# Patient Record
Sex: Female | Born: 1955 | Race: Black or African American | Hispanic: No | Marital: Married | Smoking: Never smoker
Health system: Southern US, Community
[De-identification: ages and names within clinical notes are randomized; demographics above are authoritative.]

## PROBLEM LIST (undated history)

## (undated) DIAGNOSIS — T80219A Unspecified infection due to central venous catheter, initial encounter: Secondary | ICD-10-CM

## (undated) DIAGNOSIS — Z9221 Personal history of antineoplastic chemotherapy: Secondary | ICD-10-CM

## (undated) DIAGNOSIS — L639 Alopecia areata, unspecified: Secondary | ICD-10-CM

## (undated) DIAGNOSIS — Z923 Personal history of irradiation: Secondary | ICD-10-CM

## (undated) DIAGNOSIS — E785 Hyperlipidemia, unspecified: Secondary | ICD-10-CM

## (undated) DIAGNOSIS — T451X5A Adverse effect of antineoplastic and immunosuppressive drugs, initial encounter: Secondary | ICD-10-CM

## (undated) DIAGNOSIS — D6481 Anemia due to antineoplastic chemotherapy: Secondary | ICD-10-CM

## (undated) DIAGNOSIS — E119 Type 2 diabetes mellitus without complications: Secondary | ICD-10-CM

## (undated) DIAGNOSIS — Z8742 Personal history of other diseases of the female genital tract: Secondary | ICD-10-CM

## (undated) DIAGNOSIS — G620 Drug-induced polyneuropathy: Secondary | ICD-10-CM

## (undated) DIAGNOSIS — N390 Urinary tract infection, site not specified: Secondary | ICD-10-CM

## (undated) DIAGNOSIS — C50919 Malignant neoplasm of unspecified site of unspecified female breast: Secondary | ICD-10-CM

## (undated) DIAGNOSIS — I10 Essential (primary) hypertension: Secondary | ICD-10-CM

## (undated) HISTORY — DX: Essential (primary) hypertension: I10

## (undated) HISTORY — PX: COLONOSCOPY: SHX174

## (undated) HISTORY — PX: MASTECTOMY: SHX3

## (undated) HISTORY — DX: Alopecia areata, unspecified: L63.9

## (undated) HISTORY — DX: Malignant neoplasm of unspecified site of unspecified female breast: C50.919

---

## 1993-04-05 HISTORY — PX: ABDOMINAL HYSTERECTOMY: SHX81

## 2005-09-09 ENCOUNTER — Encounter: Admission: RE | Admit: 2005-09-09 | Discharge: 2005-09-09 | Payer: Self-pay | Admitting: Internal Medicine

## 2005-09-22 ENCOUNTER — Encounter: Admission: RE | Admit: 2005-09-22 | Discharge: 2005-09-22 | Payer: Self-pay | Admitting: Internal Medicine

## 2006-09-19 ENCOUNTER — Encounter: Admission: RE | Admit: 2006-09-19 | Discharge: 2006-09-19 | Payer: Self-pay | Admitting: Internal Medicine

## 2007-09-25 ENCOUNTER — Encounter: Admission: RE | Admit: 2007-09-25 | Discharge: 2007-09-25 | Payer: Self-pay | Admitting: Internal Medicine

## 2008-09-26 ENCOUNTER — Encounter: Admission: RE | Admit: 2008-09-26 | Discharge: 2008-09-26 | Payer: Self-pay | Admitting: Internal Medicine

## 2009-09-30 ENCOUNTER — Encounter: Admission: RE | Admit: 2009-09-30 | Discharge: 2009-09-30 | Payer: Self-pay | Admitting: Internal Medicine

## 2010-08-25 ENCOUNTER — Other Ambulatory Visit: Payer: Self-pay | Admitting: Internal Medicine

## 2010-08-25 DIAGNOSIS — Z1231 Encounter for screening mammogram for malignant neoplasm of breast: Secondary | ICD-10-CM

## 2010-10-06 ENCOUNTER — Ambulatory Visit
Admission: RE | Admit: 2010-10-06 | Discharge: 2010-10-06 | Disposition: A | Discharge status: Home / Self Care | Payer: Private Health Insurance - Indemnity | Source: Ambulatory Visit | Attending: Internal Medicine | Admitting: Internal Medicine

## 2010-10-06 DIAGNOSIS — Z1231 Encounter for screening mammogram for malignant neoplasm of breast: Secondary | ICD-10-CM

## 2011-09-02 ENCOUNTER — Other Ambulatory Visit: Payer: Self-pay | Admitting: Internal Medicine

## 2011-09-02 DIAGNOSIS — Z1231 Encounter for screening mammogram for malignant neoplasm of breast: Secondary | ICD-10-CM

## 2011-10-13 ENCOUNTER — Ambulatory Visit
Admission: RE | Admit: 2011-10-13 | Discharge: 2011-10-13 | Disposition: A | Discharge status: Home / Self Care | Payer: Private Health Insurance - Indemnity | Source: Ambulatory Visit | Attending: Internal Medicine | Admitting: Internal Medicine

## 2011-10-13 DIAGNOSIS — Z1231 Encounter for screening mammogram for malignant neoplasm of breast: Secondary | ICD-10-CM

## 2012-09-06 ENCOUNTER — Other Ambulatory Visit: Payer: Self-pay

## 2012-09-06 DIAGNOSIS — Z1231 Encounter for screening mammogram for malignant neoplasm of breast: Secondary | ICD-10-CM

## 2012-10-16 ENCOUNTER — Ambulatory Visit
Admission: RE | Admit: 2012-10-16 | Discharge: 2012-10-16 | Disposition: A | Discharge status: Home / Self Care | Payer: Private Health Insurance - Indemnity | Source: Ambulatory Visit

## 2012-10-16 ENCOUNTER — Ambulatory Visit: Payer: Private Health Insurance - Indemnity

## 2012-10-16 DIAGNOSIS — Z1231 Encounter for screening mammogram for malignant neoplasm of breast: Secondary | ICD-10-CM

## 2013-09-11 ENCOUNTER — Other Ambulatory Visit: Payer: Self-pay

## 2013-09-11 DIAGNOSIS — Z1231 Encounter for screening mammogram for malignant neoplasm of breast: Secondary | ICD-10-CM

## 2013-10-03 HISTORY — PX: BREAST SURGERY: SHX581

## 2013-10-17 ENCOUNTER — Other Ambulatory Visit: Payer: Self-pay | Admitting: Internal Medicine

## 2013-10-17 ENCOUNTER — Ambulatory Visit
Admission: RE | Admit: 2013-10-17 | Discharge: 2013-10-17 | Disposition: A | Discharge status: Home / Self Care | Payer: Managed Care, Other (non HMO) | Source: Ambulatory Visit

## 2013-10-17 DIAGNOSIS — Z1231 Encounter for screening mammogram for malignant neoplasm of breast: Secondary | ICD-10-CM

## 2013-10-17 DIAGNOSIS — R928 Other abnormal and inconclusive findings on diagnostic imaging of breast: Secondary | ICD-10-CM

## 2013-10-25 ENCOUNTER — Other Ambulatory Visit: Payer: Managed Care, Other (non HMO)

## 2013-10-26 ENCOUNTER — Ambulatory Visit
Admission: RE | Admit: 2013-10-26 | Discharge: 2013-10-26 | Disposition: A | Discharge status: Home / Self Care | Payer: Managed Care, Other (non HMO) | Source: Ambulatory Visit | Attending: Internal Medicine | Admitting: Internal Medicine

## 2013-10-26 ENCOUNTER — Other Ambulatory Visit: Payer: Self-pay | Admitting: Internal Medicine

## 2013-10-26 DIAGNOSIS — R928 Other abnormal and inconclusive findings on diagnostic imaging of breast: Secondary | ICD-10-CM

## 2013-10-26 DIAGNOSIS — N6320 Unspecified lump in the left breast, unspecified quadrant: Secondary | ICD-10-CM

## 2013-10-30 ENCOUNTER — Ambulatory Visit
Admission: RE | Admit: 2013-10-30 | Discharge: 2013-10-30 | Disposition: A | Discharge status: Home / Self Care | Payer: Managed Care, Other (non HMO) | Source: Ambulatory Visit | Attending: Internal Medicine | Admitting: Internal Medicine

## 2013-10-30 ENCOUNTER — Other Ambulatory Visit: Payer: Self-pay | Admitting: Internal Medicine

## 2013-10-30 DIAGNOSIS — N6320 Unspecified lump in the left breast, unspecified quadrant: Secondary | ICD-10-CM

## 2013-10-31 ENCOUNTER — Other Ambulatory Visit: Payer: Self-pay | Admitting: Internal Medicine

## 2013-10-31 DIAGNOSIS — C50912 Malignant neoplasm of unspecified site of left female breast: Secondary | ICD-10-CM

## 2013-10-31 DIAGNOSIS — D0512 Intraductal carcinoma in situ of left breast: Secondary | ICD-10-CM

## 2013-11-01 ENCOUNTER — Other Ambulatory Visit: Payer: Managed Care, Other (non HMO)

## 2013-11-02 ENCOUNTER — Ambulatory Visit
Admission: RE | Admit: 2013-11-02 | Discharge: 2013-11-02 | Disposition: A | Discharge status: Home / Self Care | Payer: Managed Care, Other (non HMO) | Source: Ambulatory Visit | Attending: Internal Medicine | Admitting: Internal Medicine

## 2013-11-02 ENCOUNTER — Other Ambulatory Visit: Payer: Self-pay | Admitting: Internal Medicine

## 2013-11-02 ENCOUNTER — Telehealth: Payer: Self-pay | Admitting: *Deleted

## 2013-11-02 DIAGNOSIS — C50912 Malignant neoplasm of unspecified site of left female breast: Secondary | ICD-10-CM

## 2013-11-02 DIAGNOSIS — D0512 Intraductal carcinoma in situ of left breast: Secondary | ICD-10-CM

## 2013-11-02 MED ORDER — GADOBENATE DIMEGLUMINE 529 MG/ML IV SOLN: 15.0000 mL | Freq: Once | INTRAVENOUS | Status: AC | PRN

## 2013-11-02 NOTE — Telephone Encounter (Signed)
Confirmed [facility] for 11/07/13 at 1230 .  Instructions and contact information given.

## 2013-11-05 ENCOUNTER — Ambulatory Visit
Admission: RE | Admit: 2013-11-05 | Discharge: 2013-11-05 | Disposition: A | Discharge status: Home / Self Care | Payer: Managed Care, Other (non HMO) | Source: Ambulatory Visit | Attending: Internal Medicine | Admitting: Internal Medicine

## 2013-11-05 DIAGNOSIS — C50912 Malignant neoplasm of unspecified site of left female breast: Secondary | ICD-10-CM

## 2013-11-06 ENCOUNTER — Other Ambulatory Visit: Payer: Self-pay | Admitting: Internal Medicine

## 2013-11-06 DIAGNOSIS — R928 Other abnormal and inconclusive findings on diagnostic imaging of breast: Secondary | ICD-10-CM

## 2013-11-07 ENCOUNTER — Other Ambulatory Visit (INDEPENDENT_AMBULATORY_CARE_PROVIDER_SITE_OTHER): Payer: Self-pay | Admitting: General Surgery

## 2013-11-07 ENCOUNTER — Ambulatory Visit (HOSPITAL_BASED_OUTPATIENT_CLINIC_OR_DEPARTMENT_OTHER): Payer: Managed Care, Other (non HMO) | Admitting: Hematology

## 2013-11-07 ENCOUNTER — Ambulatory Visit (HOSPITAL_BASED_OUTPATIENT_CLINIC_OR_DEPARTMENT_OTHER): Payer: Managed Care, Other (non HMO) | Admitting: General Surgery

## 2013-11-07 ENCOUNTER — Ambulatory Visit
Admission: RE | Admit: 2013-11-07 | Discharge: 2013-11-07 | Disposition: A | Discharge status: Home / Self Care | Payer: Managed Care, Other (non HMO) | Source: Ambulatory Visit | Attending: Radiation Oncology | Admitting: Radiation Oncology

## 2013-11-07 ENCOUNTER — Ambulatory Visit: Payer: Managed Care, Other (non HMO)

## 2013-11-07 ENCOUNTER — Encounter: Payer: Self-pay | Admitting: General Practice

## 2013-11-07 VITALS — BP 134/80 | HR 70 | Temp 98.4°F | Resp 18 | Ht 64.0 in | Wt 167.8 lb

## 2013-11-07 DIAGNOSIS — Z170 Estrogen receptor positive status [ER+]: Secondary | ICD-10-CM

## 2013-11-07 DIAGNOSIS — C50412 Malignant neoplasm of upper-outer quadrant of left female breast: Secondary | ICD-10-CM

## 2013-11-07 DIAGNOSIS — C50919 Malignant neoplasm of unspecified site of unspecified female breast: Secondary | ICD-10-CM

## 2013-11-07 DIAGNOSIS — C50419 Malignant neoplasm of upper-outer quadrant of unspecified female breast: Secondary | ICD-10-CM

## 2013-11-07 DIAGNOSIS — R928 Other abnormal and inconclusive findings on diagnostic imaging of breast: Secondary | ICD-10-CM

## 2013-11-07 NOTE — Progress Notes (Signed)
Rachel [name] NOTE  Patient Care Team: [name], MD as PCP - General (Internal Medicine) [name], MD as Consulting Physician (General Surgery) Rexene Edison, MD as Consulting Physician (Radiation Oncology)  CHIEF COMPLAINTS/PURPOSE OF CONSULTATION:   HER 2 Positive Left Breast Cancer for Consideration of Neoadjuvant Chemotherapy.  HISTORY OF PRESENTING ILLNESS:   Rachel [name] 58 y.o. pleasant female who lives in [place], [place] is evaluated today for a new diagnosis of a HER-2+ breast cancer. She was seen at [facility] today along with Dr Barry Dienes from Surgery and Dr Valere Dross in Radiation Oncology in the multidisciplinary clinic. Patient came today along with her husband Oneida Alar.  She had been getting annual mammograms since she turned 52. She never had a previous breast problem or biopsy but is known to have dense breasts as per her previous mammograms. Last year mammogram 10/16/2012 was fine. This year she went for her screening mammogram but did not feel a lump. On 10/17/2013 to the Breast center of [facility] and was found to have distortion in left breast and spot compression views and ultrasound was recommended.    LEFT CC AND MLO VIEWS showed abnormality in left breast     Spot Compression views showed persistent abnormality in left breast.     US breast showed a hypoechoic irregular mass with surrounding distortion at 2'0 clock position located 4 cm from nipple measuring 1.1 x 0.9 x 0.7 cm. No left axillary lymphadenopathy noted. [place] was corresponding to mammographic results.     Patient underwent an US guided biopsy with Clip placement on 10/30/2013. Pathology from that (Accession no [id number]) showed an Invasive ductal carcinoma, DCIS and calcifications. Estrogen receptor (ER) 100%+, Progesterone receptor (PR) 42%+ and proliferative marker KI-67 index 53%+. Her-2 receptor status by FISH is positive. So it is a triple  positive breast cancer, also known as "LUMINAL B molecular subtype". The grade of tumor invasive and DCIS was reported as grade II. Luminal B breast tumors are usually large in size, higher grade, high mitotic index, high nodal involvement and up to 30% have p53 mutation. Their survival is also very good though little less than Luminal A subtype. Overall I will call it a favorable biology of tumor.  Interestingly the MRI breast performed on 11/05/2013 showed an enhancing mass which is much larger than the mass seen mammographically or sonographically.  FINDINGS: Breast composition: b. Scattered fibroglandular tissue. Background parenchymal enhancement: Moderate Right breast: No mass or abnormal enhancement. Left breast: In the anterior and middle thirds of the upper and upper outer quadrant of the left breast is a heterogeneous enhancing irregular mass measuring 5 x 2.5 x 5.1 cm in transverse, anterior-posterior and craniocaudal dimensions. Signal void artifact is seen along the lateral aspect of enhancement consistent with the biopsy clip. Along the anterior aspect of the irregular enhancing mass is a more focal 1.5 cm enhancement. Lymph nodes: There is a 9 mm level 1 left axillary lymph node that does not maintain a normal fatty hilum. It is concerning for metastatic involvement. Ancillary findings: None.  IMPRESSION: Irregular enhancing mass measuring 5.1 cm in the upper and upper-outer quadrants of the left breast. T Suspicious level 1 left axillary lymph node concerning for metastatic involving.              It was also recommended to get a biopsy of the anterior aspect of abnormal enhancement in the left breast is recommended to prove the extent  of disease. A second-look ultrasound could be performed for the lymph node seen with MR imaging with possible biopsy if clinically indicated.   I have seen the imaging studies and discussed the case with the surgeon and radiation oncologist. On 11/09/13  patient is getting a biopsy with MRI from the anterior enhancing mass and on 11/12/13, we are planning to do an ultrasound guided lymph node biopsy. There was discussion today about the use of Neoadjuvant chemotherapy on her which is very appropriate and I discussed that with patient in detail.  Clinically and based on radiographic data so far it looks like a stage III-A breast cancer (size max 5.1 cm T3, possible nodal involvement N1). The area of the mass had some bruising and swelling post procedure but I can definitely feel the mass and there is no nipple involvement or skin changes or inflammatory component. I cannot feel the lymph node on my exam. If we call it T2 lesion (2-5 cm) after we get more pathological information on tumor and if Lymph node comes back negative, then it could also be a Stage II-A cancer(T2,N0) so further clinical staging is still on way.  Patient  is calm and composed and a lady of faith and does not seem overwhelmed with seeing all the doctors and have excellent support from her husband and family.  I reviewed her records extensively and collaborated the history with the patient.  In terms of breast cancer risk profile:   She menarched at early age of 60 and went to menopause at age 42 when she had a partial hysterectomy (1995) and one ovary removed (for endometriosis), following year she total hysterectomy and other ovary removed as well (1996). She had 1 pregnancy, her first child was born at age 89.  Breast feeding history I did not ask.  She received birth control pills and shots off and on for 20 years.  She was never exposed to fertility medications but did take hormone replacement therapy (Premarin) for about 5 years ending in 2001. She has NO family history of Breast/GYN/GI cancer. Last screening colonoscopy was 2007 and she had a bone density done 06/13/2008 at [place] office showing a normal BMD by WHO criteria. Last pap done 1997.  MEDICAL HISTORY:    Past Medical History  Diagnosis Date  . Breast cancer   . Diabetes mellitus without complication   . Hypertension   . Alopecia areata   . Endometriosis   . Hypercholesteremia     SURGICAL HISTORY: Past Surgical History  Procedure Laterality Date  . Abdominal hysterectomy      BSO   Partial hysterectomy 1995 and total hysterectomy with oophorectomy 1996 for endometriosis.  SOCIAL HISTORY: History   Social History  . Marital Status: Married    Spouse Name: Willaim Rayas. Foust    Number of Children: N/A  . Years of Education: N/A   Occupational History  . House wife    Social History Main Topics  . Smoking status: Never Smoker   . Smokeless tobacco: Not on file  . Alcohol Use: No  . Drug Use: No  . Sexual Activity: Yes    Birth Control/ Protection: Post-menopausal   Other Topics Concern  . Not on file   Social History Narrative   Patient lives in [place], [place]. She is a housewife. Both her parents are deceased father at age 39 and mother at age 13. Patient have 2 brothers and 2 sisters. She has a daughter Haxtun  Adams who lives in [place], [place]. Patient has good family support. She does not smoke or consume alcohol and no use of recreational drugs.     FAMILY HISTORY: Family History  Problem Relation Age of Onset  . Diabetes Mother   . Coronary artery disease Mother   . Diabetes Father   . Diabetes Sister   . Diabetes Brother   . Diabetes Brother    No history of cancer and her breast cancer does not appear to be hereditary type rather sporadic type.  ALLERGIES:  is allergic to sulfa antibiotics; tetanus toxoids; multihance; and penicillins.  MEDICATIONS:  Current Outpatient Prescriptions  Medication Sig Dispense Refill  . aspirin 81 MG tablet Take 81 mg by mouth daily.      . calcium citrate-vitamin D (CITRACAL+D) 315-200 MG-UNIT per tablet Take 1 tablet by mouth daily.      . clobetasol cream (TEMOVATE) 9.62 % Apply 1 application topically as  needed.      . hydrochlorothiazide (HYDRODIURIL) 25 MG tablet Take 25 mg by mouth daily.      . minoxidil (ROGAINE) 2 % external solution Apply topically as needed.      . Multiple Vitamin (MULTIVITAMIN) tablet Take 1 tablet by mouth daily.       No current facility-administered medications for this visit.    REVIEW OF SYSTEMS:   Constitutional: Denies fevers, chills or abnormal night sweats Eyes: Denies blurriness of vision, double vision or watery eyes Ears, nose, mouth, throat, and face: Denies mucositis or sore throat Respiratory: Denies cough, dyspnea or wheezes Cardiovascular: Denies palpitation, chest discomfort or lower extremity swelling Gastrointestinal:  Denies nausea, heartburn or change in bowel habits Skin: Denies abnormal skin rashes Lymphatics: Denies new lymphadenopathy or easy bruising Neurological:Denies numbness, tingling or new weaknesses Behavioral/Psych: Mood is stable, no new changes  All other systems were reviewed with the patient and are negative.  PHYSICAL EXAMINATION:  ECOG PERFORMANCE STATUS: 1, KPS 100  Filed Vitals:   11/07/13 1334  BP: 134/80  Pulse: 70  Temp: 98.4 F (36.9 C)  Resp: 18   Filed Weights   11/07/13 1334  Weight: 167 lb 12.8 oz (76.114 kg)    GENERAL:alert, no distress and comfortable SKIN: skin color, texture, turgor are normal, no rashes or significant lesions EYES: normal, conjunctiva are pink and non-injected, sclera clear OROPHARYNX:no exudate, no erythema and lips, buccal mucosa, and tongue normal  NECK: supple, thyroid normal size, non-tender, without nodularity LYMPH:  no palpable lymphadenopathy in the cervical, axillary or inguinal LUNGS: clear to auscultation and percussion with normal breathing effort HEART: regular rate & rhythm and no murmurs and no lower extremity edema ABDOMEN:abdomen soft, non-tender and normal bowel sounds Musculoskeletal:no cyanosis of digits and no clubbing  PSYCH: alert & oriented x 3  with fluent speech NEURO: no focal motor/sensory deficits BREAST EXAM: Right breast fibrocystic feeling, no discrete or palpable mass noted, no skin or nipple changes, no adenopathy. Left breast biopsy site is swollen and tender, Underlying mass can be felt about 3 cm on exam, no nodes felt in axilla. No supraclavicular or cervical LN felt on either side.  LABORATORY DATA:   From Dr polite's note and labs from 05/14/2013:  Hemoglobin A1C 6.1, Glucose 77, bun 16, creatinine 0.92, sodium 142, k 4.3, chloride 103, CO2 29, calcium 10.3, albumin 4.2, total bili 0.7, Alk phos 57, AST 22, ALT 18, TSH 1.17, Cholesterol 222, LDL 140, HDL 56  Last CBC 05/09/2012 WBC 5.6, HEMOGLOBIN 13.1 HCT 41  PLATELETS 161 MCV 87  RADIOGRAPHIC STUDIES:  I have personally reviewed the radiological images as listed above and agreed with the findings in the report.These included Mammograms, Ultrasound, MRI breast and report on Bone density test.  PATHOLOGY DATA:  Reviewed.      ASSESSMENT:   1. This is a very pleasant 58 years-old Afro-american female from [facility] who presents with a left breast mass. This was picked on a mammogram and maximal diameter was 1.7 cm on Ultrasound however the MRI breast showed this mass to be large almost 5.1 cm in maximal dimension making it a clinical T3 tumor. Also it showed a suspicious looking solitary lymph node in axilla and that can make it a Clinical Stage III-A tumor.  2. The tumor biology indicates a LUMINAL-B type which means ER/PR/HER2 + tumor. Tumor is strongly ER+100%, PR 42%,KI-67 Index 53% showing a brisk mitotic rate and grade is called grade 2 (but grading as per pathologist cannot be relied upon on initial biopsy).  3. I saw her for consideration of neoadjuvant chemotherapy and she would be a good candidate for that.  4. We are still pursuing further diagnostic tests by getting a biopsy from the mass anteriorly and sampling the lymph node so we can accurately  characterize her stage.  5. In any event, she can benefit from neoadjuvant chemotherapy esp since her tumor is her-2 positive and neoadjuvant chemotherapy has the potential of downsizing her tumor and making her a lumpectomy candidate. Also we can get an idea about the clinical behavior of the tumor in terms of the pathologic response caused by chemotherapy. Lot of these her2 + tumors respond dramatically and can lead to complete pathological response which then gives her a very good chance for remission and perhaps a cure.  6. The chemo-regimen I am proposing to use is a NCCN-endorsed preferred regimen which has very good activity but avoids the cardiotoxicity and leukemogenesis risk associated with using anthracyclines. We use Taxotere and Carboplatin as the backbone chemotherapy and use Trastuzumab (Herceptin) and pertuzumab (Perjeta) as the her-2 directed agents. So all 4 agents are used what we call as TCH-P regimen.  7. The data for this comes from [facility] trial which is a phase 2 neoadjuvant study looking at chemotherapies with and without anthracyclines. The design of this trial was     8. As noted above the complete PCR rate with using TCH-P was 63.6% which is very impressive and incidence of left ventricular dysfunction was only 2.6%.  9. Perjeta/Pertuzumab is a wonderful drug and has now shown excellent results in metastatic setting and neoadjuvant setting. J Cortes et al from [place] recently published a review paper titled " The expanding role of pertuzumab in the treatment of HER2-positive breast cancer" and showed in the table listed below how this drug has evolved and now come to forefront of chemo for her2 + cancers.    10. Pertuzumab inhibits her2 by binding to different her2 epitope than trastuzumab and represents a complementary mechanism of action to trastuzumab.   11. Al-Hilli Z et al reported in July 2015 [facility] Oncol. that there is even an increase in use of NACT  (neoadjuvant chemotherapy) for T1 and T2 HER2+ tumors using combination of dual her 2 blockade.  12. An interesting paper from [place] in Breast Cancer June 2015 shows that combination of pertuzumab, trastuzumab and docetaxel reduces the recurrence of brain metastasis from breast cancer. This is important as brain is one sanctuary  site where her 2 + cancers are failing in general.  22. A review article in [facility] October 25 2013 by [facility] MM et al also captures the evolving landscape of her2 targeting in breast cancer and discuss the benefits of NACT using dual blockade.  14. PCR is emerging as an important surrogate endpoint and preliminary data from [place] studies also point in that direction.  15. Lastly the date from 2 [employer] study suggests that patients who receive pertuzumab in combination with trastuzumab and docetaxel prior to surgery were 31% less likely to experience disease worsening, recurrence or death (Hazard ratio for PFS 0.69) compared with those who receive trastuzumab and chemotherapy. Nearly 40% of patients receiving the regimen containing pertuzumab achieved complete PCR compared with 21.55 in the other treatment arm. FDA has already approved this for NACT in 2013 and EU about to approve it.  16. The most common grade 3 adverse events for pertuzumab plus doctaxel and herceptin are neutropenia 45%, febrile neutropenia 8.4% and diarrhea about 6%. Other adverse efefcts which can be seen are decreased cardiac function, infusion related reactions, hypersensitivity reactions and anaphylaxis. In [facility] study which led to NCCN endorsement all grade diarrhea was 66% and something we will need to monitor and treat aggressively.     PLAN:   1. Patient to undergo biopsy of breast enhancing mass anteriorly and from lymph node region and I will follow results.  2. Order a 2-D echo to assess cardiac function. She has three risk factors for cardiovascular disease HTN,  hyperlipidemia and pre-diabetes state, strong FH of diabetes in 5 family members. We will also arrange a consult with Cardiologist to monitor her once she starts Herceptin and Perjeta combination.   3. After getting the biopsies, we will ask Dr Barry Dienes to place a mediport next week.  4. I will plan 6 cycles of TCH-P (Taxotere, Carboplatin,Herceptin,Perjeta) and support her with Neulasta.  5. She will continue herceptin fo a year so that will go through August 2016 barring any cardiac issues which I do not anticipate.  6. After 6 cycles we will do another restaging MRI breast.  7. This would be followed by a surgery hopefully a lumpectomy with SLN biopsy.   8. If she attains a complete PCR, we will offer her adjuvant radiation therapy.  9. Anti-estrogen therapy to follow that with preference to Aromatase inhibitors.  10. Patient could have qualified for NSABP 52 TRIAL which is a phase 3 trial randomizing ER+/HER2+ tumors to [facility] & Pertuzumab or [place] & Pertuzumab & hormonal rx. This trial is currently showing as temporarily suspended but I will inquire with the research nurses.  11. Side effects of chemo were discussed in detail and they include but are not limited to hair loss, nausea, vomiting, alopecia  (she already have hx of alopecia areata), diarrhea (esp after incorporating pertuzumab along with docetaxel), cardiac decline in EF which is reversible after holding herceptin, nail changes, excessive lacrimation, fluid retention and leg edema, myelosuppression that is neutropenia, anemia and thrombocytoepnia and associated complications, neuropathy from taxotere and neulasta related side effects etc.She was given written material on all chemo drugs today.  12. She will need a cardiac monitoring q 3 months while on herceptin protocol.  13. Patient will attend a chemotherapy education class.  14. The total time frame for all adjuvant chemo would be roughly 8-9 months as her husband inquired  about it.  15. Patient was very appreciative of the multidisciplinary team including seeing a  surgeon, radiation oncologist and myself today and also appreciated all the nursing support, nursing navigator support, spiritual support and other resources we have here available for her.  16. I will also send a copy of this note to Dr Jana Hakim and Dr Nicholas Lose who will likely be seeing her also in near future as part of the breast cancer team.   70. The intention and goal for this treatment is curative and patient is willing to move forward with a very positive attitude and we will do our best to support her during the whole process.  18. RTC to see me again next week on 11/15/13 with baseline labs to be done that day and a tumor marker CA27-29 and tentative date for starting NACT is August 17th 2015. I might have told patient we will start on 11/26/13 but I will start her sooner on 11/19/2013 and we will communicate that with patient and her husband. We will alo give her scripts for anti-nausea medications and dexamethasone on 11/15/13.  All questions were answered. The patient knows to call the clinic with any problems, questions or concerns.  I spent 60 minutes counseling the patient face to face. The total time spent in the appointment was 90 minutes and more than 50% was on counseling, reviewing her records, imaging studies, pathology and discussing with other consultants.  Bernadene Bell, MD Medical Hematologist/Oncologist [place] Pager: [phone] Office No: [phone]  11/07/2013 6:02 PM

## 2013-11-07 NOTE — Progress Notes (Signed)
Visited with Rachel [name] and husband [name] Pickerel at [facility].  Per pt, her younger sister just died; funeral was day before pt's new dx.  Per pt, she has not yet told family of dx because of proximity to sister's death.  Per husband, pt is eldest and has served as a "surrogate mother" to siblings.  Couple plans to visit dtr, age 59, and her family in [place] to share news this weekend.  Family reports strong faith, with significant theological study and devotional practice at home (not currently part of local church, but watches evangelist Dr Audrie Lia and follows his ministry).  [place], [place]

## 2013-11-07 NOTE — Patient Instructions (Addendum)
Implanted Port Insertion  An implanted port is a central line that has a round shape and is placed under the skin. It is used as a long-term IV access for:    Medicines, such as chemotherapy.    Fluids.    Liquid nutrition, such as total parenteral nutrition (TPN).    Blood samples.   LET YOUR HEALTH CARE PROVIDER KNOW ABOUT:   Allergies to food or medicine.    Medicines taken, including vitamins, herbs, eye drops, creams, and over-the-counter medicines.    Any allergies to heparin.   Use of steroids (by mouth or creams).    Previous problems with anesthetics or numbing medicines.    History of bleeding problems or blood clots.    Previous surgery.    Other health problems, including diabetes and kidney problems.    Possibility of pregnancy, if this applies.  RISKS AND COMPLICATIONS  Generally, this is a safe procedure. However, as with any procedure, problems can occur. Possible problems include:   Damage to the blood vessel, bruising, or bleeding at the puncture site.    Infection.   Blood clot in the vessel that the port is in.   Breakdown of the skin over your port.   Very rarely a person may develop a condition called a pneumothorax, a collection of air in the chest that may cause one of the lungs to collapse. The placement of these catheters with the appropriate imaging guidance significantly decreases the risk of a pneumothorax.   BEFORE THE PROCEDURE    Your health care provider may want you to have blood tests. These tests can help tell how well your kidneys and liver are working. They can also show how well your blood clots.    If you take blood thinners (anticoagulant medicines), ask your health care provider when you should stop taking them.    Make arrangements for someone to drive you home. This is necessary if you have been sedated for your procedure.   PROCEDURE   Port insertion usually takes about 30-45 minutes.    An IV needle will be inserted in your arm.  Medicine for pain and medicine to help relax you (sedative) will flow directly into your body through this needle.    You will lie on an exam table, and you will be connected to monitors to keep track of your heart rate, blood pressure, and breathing throughout the procedure.   An oxygen monitoring device may be attached to your finger. Oxygen will be given.    Everything will be kept as germ free (sterile) as possible during the procedure. The skin near the point of the incision will be cleansed with antiseptic, and the area will be draped with sterile towels. The skin and deeper tissues over the port area will be made numb with a local anesthetic.   Two small cuts (incisions) will be made in the skin to insert the port. One will be made in the neck to obtain access to the vein where the catheter will lie.    Because the port reservoir will be placed under the skin, a small skin incision will be made in the upper chest, and a small pocket for the port will be made under the skin. The catheter that will be connected to the port tunnels to a large central vein in the chest. A small, raised area will remain on your body at the site of the reservoir when the procedure is complete.   The port placement   will be done under imaging guidance to ensure the proper placement.   The reservoir has a silicone covering that can be punctured with a special needle.    The port will be flushed with normal saline, and blood will be drawn to make sure it is working properly.   There will be nothing remaining outside the skin when the procedure is finished.    Incisions will be held together by stitches, surgical glue, or a special tape.  AFTER THE PROCEDURE   You will stay in a recovery area until the anesthesia has worn off. Your blood pressure and pulse will be checked.   A final chest X-ray will be taken to check the placement of the port and to ensure that there is no injury to your lung.  Document Released:  01/10/2013 Document Revised: 08/06/2013 Document Reviewed: 01/10/2013  Exit[employer] Patient Information 2015 [employer]. This information is not intended to replace advice given to you by your health care provider. Make sure you discuss any questions you have with your health care provider.

## 2013-11-07 NOTE — Progress Notes (Addendum)
[facility] NEW PATIENT EVALUATION  Name: Rachel [name] MRN: [id number]  Date:   11/07/2013           DOB: 12/20/1955  Status: outpatient   CC: [name], MD  [name], MD , Dr. 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 female who is seen today at the [facility] for the courtesy of Dr. Barry Dienes for evaluation of her clinical stage T3 N0 invasive ductal/DCIS of the left breast. Mammography on 10/17/2013 showed distortion on the left breast upper outer quadrant. Ultrasound showed a 1.1 x 0.9 cm mass at 2:00. The axilla was benign on ultrasound. Biopsy on 10/30/2013 was diagnostic for invasive ductal/DCIS. ER positive at 100% and PR +42% with Ki-67 of 53%. HER-2/neu was amplified. Breast MR on 11/05/2013 showed 5.1 cm upper and upper-outer quadrant mass and area of enhancement along with  a 9 mm level 1 left axillary lymph node that does not maintain a normal fatty hilum. It was felt to be concerning for metastatic involvement. She is scheduled this Friday for a left axillary node biopsy and also an additional biopsy of the upper-outer quadrant of the left breast to further define the extent of her disease. She is without complaints today. She'll be seen by Dr. Bernadene Bell of medical oncology later today. She is seen today with Dr. Barry Dienes.   PREVIOUS RADIATION THERAPY: No   PAST MEDICAL HISTORY:  has a past medical history of Breast cancer; Diabetes mellitus without complication; Hypertension; Alopecia areata; and Endometriosis.     PAST SURGICAL HISTORY:  Past Surgical History  Procedure Laterality Date  . Abdominal hysterectomy       FAMILY HISTORY: family history is not on file. Her father died from complications of diabetes is 20. Her mother died from complications of diabetes and  cardiac disease and 64. No family history of breast cancer.   SOCIAL HISTORY:  reports that she has never smoked. She does not have any smokeless tobacco history on file. She reports that she does not drink alcohol or use illicit drugs. Married, one daughter age 3.   ALLERGIES: Sulfa antibiotics; Tetanus toxoids; Multihance; and Penicillins   MEDICATIONS:  Current Outpatient Prescriptions  Medication Sig Dispense Refill  . aspirin 81 MG tablet Take 81 mg by mouth daily.      . calcium citrate-vitamin D (CITRACAL+D) 315-200 MG-UNIT per tablet Take 1 tablet by mouth daily.      . clobetasol cream (TEMOVATE) 9.79 % Apply 1 application topically as needed.      . hydrochlorothiazide (HYDRODIURIL) 25 MG tablet Take 25 mg by mouth daily.      . minoxidil (ROGAINE) 2 % external solution Apply topically as needed.      . Multiple Vitamin (MULTIVITAMIN) tablet Take 1 tablet by mouth daily.       No current facility-administered medications for this encounter.     REVIEW OF SYSTEMS:  Pertinent items are noted in HPI.    PHYSICAL EXAM: Alert and oriented 58 year old after American female appearing her stated age. Wt Readings from Last 3 Encounters:  No data found for Wt   Temp Readings from Last 3 Encounters:  No data found for Temp   BP Readings from Last 3 Encounters:  No data found for BP   Pulse  Readings from Last 3 Encounters:  No data found for Pulse   Head and neck examination: Grossly unremarkable. Nodes: Without palpable cervical, supraclavicular, or axillary lymphadenopathy. Chest: Lungs clear. Breasts: There is a biopsy wound at approximately 2:00 along the upper-outer quadrant of left breast with surrounding ecchymosis. No discreet masses are appreciated. Right breast without masses or lesions. Extremities: Without edema.    LABORATORY DATA:  No results found for this basename: WBC, HGB, HCT, MCV, PLT   No results found for this basename: NA, K, CL, CO2   No results  found for this basename: ALT, AST, GGT, ALKPHOS, BILITOT      IMPRESSION: Clinical stage II B (T3, N0, M0) invasive ductal/DCIS of the left breast. It is unclear as to the extent of her primary tumor and also whether not she has axillary involvement. She will undergo MRI guided biopsies this Friday. We discussed the role of radiation therapy with possible breast preservation, or post mastectomy. We discussed the potential acute and late toxicities of radiation therapy. Once we define the extent of her local disease,  she can be counseled on whether not she is a candidate for breast preservation, and whether not she would be a good candidate for neoadjuvant chemotherapy. If her lymph node is positive then she may be a candidate for the [facility] Trial.   PLAN: As discussed above.   I spent 45 minutes minutes face to face with the patient and more than 50% of that time was spent in counseling and/or coordination of care.

## 2013-11-07 NOTE — Assessment & Plan Note (Signed)
Patient has a new diagnosis of a left-sided breast cancer. Her clinical staging is likely a T3N1, but may be a T1 N0. She has discrepancy in the size a lesion between ultrasound and MR. She also has an abnormal appearing lymph node that has a normal size. She  Is scheduled to undergo biopsy of the anterior portion of the breast lesion and the lymph node in order to determine further treatment.  If she does have a smaller lesion, we would proceed straight to surgery. If she has a larger lesion, we will recommend upfront chemotherapy. She will need a Port-A-Cath no matter which direction we go because she has a HER-2 positive tumor.  I did discuss the option for mastectomy. There is also a possibility that the anterior medial portion of the lesion will be negative but may have an area that needs to be resected for discordance. The we'll address this is as a result.  Once her biopsy results are available, we will make a plan and set up surgery. I did review the risks of Port-A-Cath placement. I discussed pneumothorax, hemothorax, position, infection, nonfunction. I also discussed generally mastectomy and seed localized lumpectomy with sentinel lymph node biopsy. If we are to proceed straight to breast surgery, I will see her back in my office to discuss this further. If we are going to do neoadjuvant chemotherapy, I will put her on the schedule for a Port-A-Cath.  45 min spent in evaluation, examination, counseling, and coordination of care.  >50% spent in counseling.

## 2013-11-07 NOTE — Progress Notes (Signed)
Chief complaint:  New left breast cancer  Referring MD: Dr. Delfina Redwood.    HISTORY:  Patient is a 58 year old female who is referred by Dr. Cinda Quest for evaluation and consultation regarding a new left breast cancer. She presented with an area of scarring distortion on her left breast. She underwent diagnostic mammogram and biopsy and was found to have a cancer. 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  She does not have any family history of cancer. She has been getting mammograms regularly. She had menarche at age 68-12. She is postmenopausal. She did use hormone replacement for approximately 5 years. She had one child at age 85. She used hormonal contraception on and off for 20 years. She is up-to-date with her colonoscopy and bone density study.  Past Medical History  Diagnosis Date  . Breast cancer   . Diabetes mellitus without complication   . Hypertension   . Alopecia areata   . Endometriosis   . Hypercholesteremia     Past Surgical History  Procedure Laterality Date  . Abdominal hysterectomy      BSO    Current Outpatient Prescriptions  Medication Sig Dispense Refill  . aspirin 81 MG tablet Take 81 mg by mouth daily.      . calcium citrate-vitamin D (CITRACAL+D) 315-200 MG-UNIT per tablet Take 1 tablet by mouth daily.      . clobetasol cream (TEMOVATE) 6.28 % Apply 1 application topically as needed.      . hydrochlorothiazide (HYDRODIURIL) 25 MG tablet Take 25 mg by mouth daily.      . minoxidil (ROGAINE) 2 % external solution Apply topically as needed.      . Multiple Vitamin (MULTIVITAMIN) tablet Take 1 tablet by mouth daily.       No current facility-administered medications for this visit.     Allergies  Allergen Reactions  . Sulfa Antibiotics Swelling  . Tetanus Toxoids   . Multihance [Gadobenate] Nausea And Vomiting  . Penicillins Rash     Family History  Problem Relation Age of Onset  . Diabetes Mother   . Coronary artery disease Mother   . Diabetes  Father   . Diabetes Sister   . Diabetes Brother   . Diabetes Brother      History   Social History  . Marital Status: Married    Spouse Name: N/A    Number of Children: N/A  . Years of Education: N/A   Occupational History  . House wife    Social History Main Topics  . Smoking status: Never Smoker   . Smokeless tobacco: Not on file  . Alcohol Use: No  . Drug Use: No  . Sexual Activity: Yes    Birth Control/ Protection: Post-menopausal   Other Topics Concern  . Not on file   Social History Narrative  . No narrative on file     REVIEW OF SYSTEMS - PERTINENT POSITIVES ONLY: 12 point review of systems negative other than HPI and PMH except for glasses, and diabetes.    EXAM: Wt Readings from Last 3 Encounters:  11/07/13 167 lb 12.8 oz (76.114 kg)   Temp Readings from Last 3 Encounters:  11/07/13 98.4 F (36.9 C)    BP Readings from Last 3 Encounters:  11/07/13 134/80   Pulse Readings from Last 3 Encounters:  11/07/13 70     Wt Readings from Last 3 Encounters:  11/07/13 167 lb 12.8 oz (76.114 kg)     Gen:  No acute distress.  Well nourished  and well groomed.   Neurological: Alert and oriented to person, place, and time. Coordination normal.  Head: Normocephalic and atraumatic.  Eyes: Conjunctivae are normal. Pupils are equal, round, and reactive to light. No scleral icterus.  Neck: Normal range of motion. Neck supple. No tracheal deviation or thyromegaly present.  Cardiovascular: Normal rate, regular rhythm, normal heart sounds and intact distal pulses.  Exam reveals no gallop and no friction rub.  No murmur heard. Breast: hematoma at site of biopsy of left.  Some tethered skin here, but no dimpling.  No palpable masses. No nipple retraction or skin dimpling on either side.  Dense breast tissue bilaterally.  No LAD.     Respiratory: Effort normal.  No respiratory distress. No chest wall tenderness. Breath sounds normal.  No wheezes, rales or rhonchi.  GI:  Soft. Bowel sounds are normal. The abdomen is soft and nontender.  There is no rebound and no guarding.  Musculoskeletal: Normal range of motion. Extremities are nontender.  Lymphadenopathy: No cervical, preauricular, postauricular or axillary adenopathy is present Skin: Skin is warm and dry. No rash noted. No diaphoresis. No erythema. No pallor. No clubbing, cyanosis, or edema.   Psychiatric: Normal mood and affect. Behavior is normal. Judgment and thought content normal.    LABORATORY RESULTS: Available labs are reviewed  Pathology Diagnosis Breast, left, needle core biopsy, mass, 2 o'clock 4 fn - INVASIVE DUCTAL CARCINOMA, SEE COMMENT. - DUCTAL CARCINOMA IN SITU. - CALCIFICATIONS IDENTIFIED.  RADIOLOGY RESULTS: See E-Chart or I-Site for most recent results.  Images and reports are reviewed.  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  LABS:  BUN and creatinine were obtained on site at [place] at  [address]  Results:  BUN 13 mg/dL,  Creatinine 1.0 mg/dL.  EXAM: BILATERAL BREAST MRI WITH AND WITHOUT CONTRAST  TECHNIQUE: Multiplanar, multisequence MR images of both breasts were obtained prior to and following the intravenous administration of MultiHance. The patient's original MRI was on 11/02/2013. During the exam she became nauseated and vomited. The patient was rescanned for pre and post contrast images on 11/05/2013. During both examinations the patient received 15 cc of MultiHance.  THREE-DIMENSIONAL MR IMAGE RENDERING ON INDEPENDENT WORKSTATION:  Three-dimensional MR images were rendered by post-processing of the original MR data on an independent workstation. The three-dimensional MR images were interpreted, and findings are reported in the following complete MRI report for this study. Three dimensional  images were evaluated at the independent DynaCad workstation  COMPARISON:  Previous exams  FINDINGS: Breast composition: b.  Scattered fibroglandular tissue.  Background parenchymal enhancement: Moderate  Right breast: No mass or abnormal enhancement.  Left breast: In the anterior and middle thirds of the upper and upper outer quadrant of the left breast is a heterogeneous enhancing irregular mass measuring 5 x 2.5 x 5.1 cm in transverse, anterior-posterior and craniocaudal dimensions. Signal void artifact is seen along the lateral aspect of enhancement consistent with the biopsy clip. Along the anterior aspect of the irregular enhancing mass is a more focal 1.5 cm enhancement.  Lymph nodes: There is a 9 mm level 1 left axillary lymph node that does not maintain a normal fatty hilum. It is concerning for metastatic involvement.  Ancillary findings:  None.  IMPRESSION: Irregular enhancing mass measuring 5.1 cm in the upper and upper-outer quadrants of  the left breast. The MR enhancing mass is much larger than the mass seen mammographically or sonographically. Suspicious level 1 left axillary lymph node concerning for metastatic involving.  RECOMMENDATION: MR guided core biopsy of the anterior aspect of abnormal enhancement in the left breast is recommended to prove the extent of disease. A second-look ultrasound could be performed for the lymph node seen with MR imaging with possible biopsy if clinically indicated. Otherwise I would suggest correlation with sentinel lymph node biopsy.  BI-RADS CATEGORY  6: Known biopsy-proven malignancy.   Electronically Signed   By: Lillia Mountain M.D.   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  LABS:  BUN and creatinine were obtained on site at [facility] at  [address]  Results:   BUN 13 mg/dL,  Creatinine 1.0 mg/dL.  EXAM: BILATERAL BREAST MRI WITH AND WITHOUT CONTRAST  TECHNIQUE: Multiplanar, multisequence MR images of both breasts were obtained prior to and following the intravenous administration of MultiHance. The patient's original MRI was on 11/02/2013. During the exam she became nauseated and vomited. The patient was rescanned for pre and post contrast images on 11/05/2013. During both examinations the patient received 15 cc of MultiHance.  THREE-DIMENSIONAL MR IMAGE RENDERING ON INDEPENDENT WORKSTATION:  Three-dimensional MR images were rendered by post-processing of the original MR data on an independent workstation. The three-dimensional MR images were interpreted, and findings are reported in the following complete MRI report for this study. Three dimensional images were evaluated at the independent DynaCad workstation  COMPARISON:  Previous exams  FINDINGS: Breast composition: b.  Scattered fibroglandular tissue.  Background parenchymal enhancement: Moderate  Right breast: No mass or abnormal enhancement.  Left breast: In the anterior and middle thirds of the upper and upper outer quadrant of the left breast is a heterogeneous enhancing irregular mass measuring 5 x 2.5 x 5.1 cm in transverse, anterior-posterior and craniocaudal dimensions. Signal void artifact is seen along the lateral aspect of enhancement consistent with the biopsy clip. Along the anterior aspect of the irregular enhancing mass is a more focal 1.5 cm enhancement.  Lymph nodes: There is a 9 mm level 1 left axillary lymph node that does not maintain a normal fatty hilum. It is concerning for metastatic involvement.  Ancillary findings:  None.  IMPRESSION: Irregular enhancing mass measuring 5.1 cm in the upper and upper-outer quadrants of the left breast. The MR enhancing mass is much larger than the mass seen mammographically or sonographically. Suspicious level 1 left axillary lymph node concerning for  metastatic involving.  RECOMMENDATION: MR guided core biopsy of the anterior aspect of abnormal enhancement in the left breast is recommended to prove the extent of disease. A second-look ultrasound could be performed for the lymph node seen with MR imaging with possible biopsy if clinically indicated. Otherwise I would suggest correlation with sentinel lymph node biopsy.  BI-RADS CATEGORY  6: Known biopsy-proven malignancy.   Electronically Signed   By: Lillia Mountain M.D.   On: 11/05/2013 16:14   Mm Digital Diagnostic Unilat L  10/30/2013   CLINICAL DATA:  Status post ultrasound-guided left breast biopsy 2 o'clock location distortion/mass  EXAM: DIAGNOSTIC left MAMMOGRAM POST ULTRASOUND BIOPSY  COMPARISON:  Previous exams  FINDINGS: Mammographic images were obtained following ultrasound guided biopsy of left breast mass with  distortion left breast 2 o'clock location. The wing shaped clip is appropriately located at the biopsy site.  IMPRESSION: Appropriate wing shaped clip location, left breast 2 o'clock location.  Final Assessment: Post Procedure Mammograms for Marker Placement   Electronically Signed   By: Conchita Paris M.D.   On: 10/30/2013 15:13   Mm Digital Diagnostic Unilat L  10/26/2013   CLINICAL DATA:  Screening callback for questioned distortion in the left breast  EXAM: DIGITAL DIAGNOSTIC  left MAMMOGRAM  ULTRASOUND left BREAST  COMPARISON:  [facility]  ACR Breast Density Category b: There are scattered areas of fibroglandular density.  FINDINGS: There is persistent distortion in the left upper outer quadrant, corresponding to the screening mammographic finding.  On physical exam, I palpate firmness in the left upper outer quadrant but no focal mass.  Ultrasound is performed, showing a hypoechoic irregular mass with surrounding distortion in the left breast 2 o'clock location 4 cm from the nipple measuring 1.1 x 0.9 x 0.7 cm. This corresponds to the mammographic finding. No left axillary  lymphadenopathy is identified.  IMPRESSION: Suspicious left breast mass 2 o'clock location. Ultrasound-guided core biopsy is recommended and will be scheduled at the patient's convenience.  RECOMMENDATION: Ultrasound-guided left breast biopsy  I have discussed the findings and recommendations with the patient. Results were also provided in writing at the conclusion of the visit. If applicable, a reminder letter will be sent to the patient regarding the next appointment.  BI-RADS CATEGORY  4: Suspicious.   Electronically Signed   By: Conchita Paris M.D.   On: 10/26/2013 11:37   Mm Digital Screening Bilateral  10/17/2013   CLINICAL DATA:  Screening.  EXAM: DIGITAL SCREENING BILATERAL MAMMOGRAM WITH CAD  COMPARISON:  Previous Exam(s)  ACR Breast Density Category c: The breast tissue is heterogeneously dense, which may obscure small masses.  FINDINGS: In the left breast, possible distortion warrants further evaluation with spot compression views and possibly ultrasound. In the right breast, no findings suspicious for malignancy. Images were processed with CAD.  IMPRESSION: Further evaluation is suggested for possible distortion in the left breast.  RECOMMENDATION: Diagnostic mammogram and possibly ultrasound of the left breast. (Code:FI-L-69M)  The patient will be contacted regarding the findings, and additional imaging will be scheduled.  BI-RADS CATEGORY  0: Incomplete. Need additional imaging evaluation and/or prior mammograms for comparison.   Electronically Signed   By: Lajean Manes M.D.   On: 10/17/2013 12:54   US Breast Ltd Uni Left Inc Axilla  10/26/2013   CLINICAL DATA:  Screening callback for questioned distortion in the left breast  EXAM: DIGITAL DIAGNOSTIC  left MAMMOGRAM  ULTRASOUND left BREAST  COMPARISON:  [facility]  ACR Breast Density Category b: There are scattered areas of fibroglandular density.  FINDINGS: There is persistent distortion in the left upper outer quadrant, corresponding to the  screening mammographic finding.  On physical exam, I palpate firmness in the left upper outer quadrant but no focal mass.  Ultrasound is performed, showing a hypoechoic irregular mass with surrounding distortion in the left breast 2 o'clock location 4 cm from the nipple measuring 1.1 x 0.9 x 0.7 cm. This corresponds to the mammographic finding. No left axillary lymphadenopathy is identified.  IMPRESSION: Suspicious left breast mass 2 o'clock location. Ultrasound-guided core biopsy is recommended and will be scheduled at the patient's convenience.  RECOMMENDATION: Ultrasound-guided left breast biopsy  I have discussed the findings and recommendations with the patient. Results were also provided in writing at the conclusion of  the visit. If applicable, a reminder letter will be sent to the patient regarding the next appointment.  BI-RADS CATEGORY  4: Suspicious.   Electronically Signed   By: Gretchen  Green M.D.   On: 10/26/2013 11:37   Us Lt Breast Bx W Loc Dev 1st Lesion Img Bx Spec Us Guide  10/31/2013   ADDENDUM REPORT: 10/31/2013 12:01  ADDENDUM: Pathology revealed grade 2 invasive ductal carcinoma and ductal carcinoma in situ with calcifications in the left breast. This was found to be concordant by Dr. Gretchen Green. Pathology was discussed with the patient by telephone and her questions were answered. She reported doing well after the biopsy with minimal tenderness at the site. Post biopsy instructions and care were reviewed. Consultation has been scheduled at [facility] on November 07, 2013. She will be scheduled for a bilateral breast MRI. She was encouraged to come to [facility] for educations materials. My number was provided for future questions and concerns.  Pathology results reported by Leigh Kuhnly RN, BSN on October 31, 2013.   Electronically Signed   By: Gretchen  Green M.D.   On: 10/31/2013 12:01   10/31/2013   CLINICAL DATA:   Suspicious left breast mass 2 o'clock location  EXAM: ULTRASOUND GUIDED left BREAST CORE NEEDLE BIOPSY  COMPARISON:  Previous exams.  PROCEDURE: I met with the patient and we discussed the procedure of ultrasound-guided biopsy, including benefits and alternatives. We discussed the high likelihood of a successful procedure. We discussed the risks of the procedure including infection, bleeding, tissue injury, clip migration, and inadequate sampling. Informed written consent was given. The usual time-out protocol was performed immediately prior to the procedure.  Using sterile technique and 2% Lidocaine as local anesthetic, under direct ultrasound visualization, a 12 gauge vacuum-assisteddevice was used to perform biopsy of left breast mass 2 o'clock location using a lateral to medial approach. At the conclusion of the procedure, a wing shaped tissue marker clip was deployed into the biopsy cavity. Follow-up 2-view mammogram was performed and dictated separately.  IMPRESSION: Ultrasound-guided biopsy of left breast mass 2 o'clock location with wing shaped clip placement. Pathology is pending. No apparent complications.  Electronically Signed: By: Gretchen  Green M.D. On: 10/30/2013 14:53      ASSESSMENT AND PLAN: Breast cancer of upper-outer quadrant of left female breast Patient has a new diagnosis of a left-sided breast cancer. Her clinical staging is likely a T3N1, but may be a T1 N0. She has discrepancy in the size a lesion between ultrasound and MR. She also has an abnormal appearing lymph node that has a normal size. She  Is scheduled to undergo biopsy of the anterior portion of the breast lesion and the lymph node in order to determine further treatment.  If she does have a smaller lesion, we would proceed straight to surgery. If she has a larger lesion, we will recommend upfront chemotherapy. She will need a Port-A-Cath no matter which direction we go because she has a HER-2 positive tumor.  I did  discuss the option for mastectomy. There is also a possibility that the anterior medial portion of the lesion will be negative but may have an area that needs to be resected for discordance. The we'll address this is as a result.  Once her biopsy results are available, we will make a plan and set up surgery. I did review the risks of Port-A-Cath placement. I discussed pneumothorax, hemothorax, position, infection, nonfunction. I also   discussed generally mastectomy and seed localized lumpectomy with sentinel lymph node biopsy. If we are to proceed straight to breast surgery, I will see her back in my office to discuss this further. If we are going to do neoadjuvant chemotherapy, I will put her on the schedule for a Port-A-Cath.  45 min spent in evaluation, examination, counseling, and coordination of care.  >50% spent in counseling.       Milus Height MD Surgical Oncology[facility]      Visit Diagnoses: 1. Breast cancer of upper-outer quadrant of left female breast     Primary Care Physician: [name], MD  Other care team Valere Dross, MD Arlys John, MD

## 2013-11-08 ENCOUNTER — Telehealth: Payer: Self-pay | Admitting: Hematology

## 2013-11-08 NOTE — Telephone Encounter (Signed)
s/w pt re appts for 8/11 and 8/13. echo to [place] for preauth. pt aware i will call back w/echo appt.

## 2013-11-09 ENCOUNTER — Ambulatory Visit
Admission: RE | Admit: 2013-11-09 | Discharge: 2013-11-09 | Disposition: A | Discharge status: Home / Self Care | Payer: Managed Care, Other (non HMO) | Source: Ambulatory Visit | Attending: Internal Medicine | Admitting: Internal Medicine

## 2013-11-09 DIAGNOSIS — R928 Other abnormal and inconclusive findings on diagnostic imaging of breast: Secondary | ICD-10-CM

## 2013-11-09 MED ORDER — GADOBENATE DIMEGLUMINE 529 MG/ML IV SOLN
15.0000 mL | Freq: Once | INTRAVENOUS | Status: AC | PRN
  Administered 2013-11-09: 15 mL via INTRAVENOUS

## 2013-11-12 ENCOUNTER — Other Ambulatory Visit (INDEPENDENT_AMBULATORY_CARE_PROVIDER_SITE_OTHER): Payer: Self-pay | Admitting: General Surgery

## 2013-11-12 ENCOUNTER — Ambulatory Visit
Admission: RE | Admit: 2013-11-12 | Discharge: 2013-11-12 | Disposition: A | Discharge status: Home / Self Care | Payer: Managed Care, Other (non HMO) | Source: Ambulatory Visit | Attending: General Surgery | Admitting: General Surgery

## 2013-11-12 ENCOUNTER — Encounter: Payer: Self-pay | Admitting: *Deleted

## 2013-11-12 DIAGNOSIS — R928 Other abnormal and inconclusive findings on diagnostic imaging of breast: Secondary | ICD-10-CM

## 2013-11-12 NOTE — Progress Notes (Signed)
[place] Psychosocial Distress Screening Clinical Social Work  Patient was seen by [employer] as noted on 11/07/13.  Patient's distress was assessed and appropriate interventions were provided.  ONCBCN DISTRESS SCREENING 11/07/2013  Elta [place] the number that describes how much distress you have been experiencing in the past week 7  Family Problem type Other (comment)  Emotional problem type Adjusting to illness  Physician notified of physical symptoms Yes  Referral to clinical psychology No  Referral to clinical social work Yes  Referral to dietition No  Referral to financial advocate No  Referral to support programs Yes  Referral to palliative care No   Johnnye Lana, MSW, [place] [phone]

## 2013-11-13 ENCOUNTER — Encounter: Payer: Self-pay | Admitting: *Deleted

## 2013-11-13 ENCOUNTER — Other Ambulatory Visit: Payer: Managed Care, Other (non HMO)

## 2013-11-13 ENCOUNTER — Telehealth: Payer: Self-pay | Admitting: *Deleted

## 2013-11-13 NOTE — Telephone Encounter (Signed)
Spoke with patient today and she will need to have her port placement date moved to the week of 11/19/13.  She is going to have to go out town sooner than she thought.  She will still see Dr. Lona Kettle 11/15/13 for her chemo planning.  Encouraged her to call with any needs.

## 2013-11-14 ENCOUNTER — Telehealth: Payer: Self-pay | Admitting: Hematology

## 2013-11-14 NOTE — Telephone Encounter (Signed)
, °

## 2013-11-15 ENCOUNTER — Encounter: Payer: Self-pay | Admitting: Hematology

## 2013-11-15 ENCOUNTER — Other Ambulatory Visit: Payer: Self-pay | Admitting: *Deleted

## 2013-11-15 ENCOUNTER — Ambulatory Visit (HOSPITAL_COMMUNITY)
Admission: RE | Admit: 2013-11-15 | Discharge: 2013-11-15 | Disposition: A | Discharge status: Home / Self Care | Payer: Managed Care, Other (non HMO) | Source: Ambulatory Visit | Attending: Hematology | Admitting: Hematology

## 2013-11-15 ENCOUNTER — Other Ambulatory Visit (HOSPITAL_BASED_OUTPATIENT_CLINIC_OR_DEPARTMENT_OTHER): Payer: Managed Care, Other (non HMO)

## 2013-11-15 ENCOUNTER — Ambulatory Visit (HOSPITAL_BASED_OUTPATIENT_CLINIC_OR_DEPARTMENT_OTHER): Payer: Managed Care, Other (non HMO) | Admitting: Hematology

## 2013-11-15 ENCOUNTER — Telehealth: Payer: Self-pay | Admitting: Hematology

## 2013-11-15 VITALS — BP 122/73 | HR 85 | Temp 98.5°F | Resp 18 | Ht 64.0 in | Wt 168.5 lb

## 2013-11-15 DIAGNOSIS — Z170 Estrogen receptor positive status [ER+]: Secondary | ICD-10-CM

## 2013-11-15 DIAGNOSIS — C50412 Malignant neoplasm of upper-outer quadrant of left female breast: Secondary | ICD-10-CM

## 2013-11-15 DIAGNOSIS — C50919 Malignant neoplasm of unspecified site of unspecified female breast: Secondary | ICD-10-CM

## 2013-11-15 DIAGNOSIS — I369 Nonrheumatic tricuspid valve disorder, unspecified: Secondary | ICD-10-CM

## 2013-11-15 DIAGNOSIS — C50419 Malignant neoplasm of upper-outer quadrant of unspecified female breast: Secondary | ICD-10-CM

## 2013-11-15 LAB — CBC WITH DIFFERENTIAL/PLATELET
BASO%: 0.9 % (ref 0.0–2.0)
Basophils Absolute: 0 10*3/uL (ref 0.0–0.1)
EOS ABS: 0.1 10*3/uL (ref 0.0–0.5)
EOS%: 1.8 % (ref 0.0–7.0)
HEMATOCRIT: 38.5 % (ref 34.8–46.6)
HGB: 12.5 g/dL (ref 11.6–15.9)
LYMPH#: 2 10*3/uL (ref 0.9–3.3)
LYMPH%: 40.5 % (ref 14.0–49.7)
MCH: 28.8 pg (ref 25.1–34.0)
MCHC: 32.6 g/dL (ref 31.5–36.0)
MCV: 88.4 fL (ref 79.5–101.0)
MONO#: 0.5 10*3/uL (ref 0.1–0.9)
MONO%: 9.6 % (ref 0.0–14.0)
NEUT%: 47.2 % (ref 38.4–76.8)
NEUTROS ABS: 2.3 10*3/uL (ref 1.5–6.5)
Platelets: 184 10*3/uL (ref 145–400)
RBC: 4.35 10*6/uL (ref 3.70–5.45)
RDW: 14.5 % (ref 11.2–14.5)
WBC: 5 10*3/uL (ref 3.9–10.3)

## 2013-11-15 LAB — COMPREHENSIVE METABOLIC PANEL (CC13)
ALT: 18 U/L (ref 0–55)
ANION GAP: 12 meq/L — AB (ref 3–11)
AST: 23 U/L (ref 5–34)
Albumin: 3.7 g/dL (ref 3.5–5.0)
Alkaline Phosphatase: 51 U/L (ref 40–150)
BILIRUBIN TOTAL: 0.69 mg/dL (ref 0.20–1.20)
BUN: 21.9 mg/dL (ref 7.0–26.0)
CALCIUM: 9.4 mg/dL (ref 8.4–10.4)
CHLORIDE: 105 meq/L (ref 98–109)
CO2: 29 meq/L (ref 22–29)
CREATININE: 1 mg/dL (ref 0.6–1.1)
Glucose: 88 mg/dl (ref 70–140)
Potassium: 3.7 mEq/L (ref 3.5–5.1)
Sodium: 145 mEq/L (ref 136–145)
Total Protein: 6.9 g/dL (ref 6.4–8.3)

## 2013-11-15 MED ORDER — PROCHLORPERAZINE MALEATE 10 MG PO TABS: 10.0000 mg | ORAL_TABLET | Freq: Four times a day (QID) | ORAL | Status: DC | PRN

## 2013-11-15 MED ORDER — LIDOCAINE-PRILOCAINE 2.5-2.5 % EX CREA: TOPICAL_CREAM | CUTANEOUS | Status: DC

## 2013-11-15 MED ORDER — DEXAMETHASONE 4 MG PO TABS: ORAL_TABLET | ORAL | Status: DC

## 2013-11-15 NOTE — Progress Notes (Signed)
  Echocardiogram 2D Echocardiogram has been performed.  Diamond Nickel 11/15/2013, 12:23 PM

## 2013-11-15 NOTE — Telephone Encounter (Signed)
S/W PT RE APPTS FOR 8/24, 8/25 AND 8/31.

## 2013-11-15 NOTE — Progress Notes (Signed)
[facility] ONCOLOGY FOLLOW UP NOTE:  Patient Care Team: [name], MD as PCP - General (Internal Medicine) [name], MD as Consulting Physician (General Surgery) Rexene Edison, MD as Consulting Physician (Radiation Oncology)  CHIEF COMPLAINT:   HER 2 Positive Left Breast Cancer for Consideration of Neoadjuvant Chemotherapy.  HISTORY OF PRESENTING ILLNESS:   Rachel [name] 58 y.o. pleasant female who lives in [place], [place] was evaluated on 11/07/2013 with a new diagnosis of a HER-2+ breast cancer. She was seen at [facility] today along with Dr Barry Dienes from Surgery and Dr Valere Dross in Radiation Oncology in the multidisciplinary clinic.   She had been getting annual mammograms since she turned 39. She never had a previous breast problem or biopsy but is known to have dense breasts as per her previous mammograms. Last year mammogram 10/16/2012 was fine. This year she went for her screening mammogram but did not feel a lump. On 10/17/2013 to the Breast center of [facility] and was found to have distortion in left breast and spot compression views and ultrasound was recommended.    LEFT CC AND MLO VIEWS showed abnormality in left breast     Spot Compression views showed persistent abnormality in left breast.     US breast showed a hypoechoic irregular mass with surrounding distortion at 2'0 clock position located 4 cm from nipple measuring 1.1 x 0.9 x 0.7 cm. No left axillary lymphadenopathy noted. [place] was corresponding to mammographic results.     Patient underwent an US guided biopsy with Clip placement on 10/30/2013. Pathology from that (Accession no [id number]) showed an Invasive ductal carcinoma, DCIS and calcifications. Estrogen receptor (ER) 100%+, Progesterone receptor (PR) 42%+ and proliferative marker KI-67 index 53%+. Her-2 receptor status by FISH is positive. So it is a triple positive breast cancer, also known as "LUMINAL B molecular  subtype". The grade of tumor invasive and DCIS was reported as grade II. Luminal B breast tumors are usually large in size, higher grade, high mitotic index, high nodal involvement and up to 30% have p53 mutation. Their survival is also very good though little less than Luminal A subtype. Overall I will call it a favorable biology of tumor.  Interestingly the MRI breast performed on 11/05/2013 showed an enhancing mass which is much larger than the mass seen mammographically or sonographically.  FINDINGS: Breast composition: b. Scattered fibroglandular tissue. Background parenchymal enhancement: Moderate Right breast: No mass or abnormal enhancement. Left breast: In the anterior and middle thirds of the upper and upper outer quadrant of the left breast is a heterogeneous enhancing irregular mass measuring 5 x 2.5 x 5.1 cm in transverse, anterior-posterior and craniocaudal dimensions. Signal void artifact is seen along the lateral aspect of enhancement consistent with the biopsy clip. Along the anterior aspect of the irregular enhancing mass is a more focal 1.5 cm enhancement. Lymph nodes: There is a 9 mm level 1 left axillary lymph node that does not maintain a normal fatty hilum. It is concerning for metastatic involvement. Ancillary findings: None.  IMPRESSION: Irregular enhancing mass measuring 5.1 cm in the upper and upper-outer quadrants of the left breast. T Suspicious level 1 left axillary lymph node concerning for metastatic involving.              It was also recommended to get a biopsy of the anterior aspect of abnormal enhancement in the left breast is recommended to prove the extent of disease. That was done on 11/09/2013  with MR guidance. Using sterile technique, 2% Lidocaine, MRI guidance, and a 9 gauge vacuum assisted device, biopsy was performed of the suspicious area of enhancement in the subareolar left breast using a lateral approach. At the conclusion of the procedure, a dumbbell  tissue marker clip was deployed into the biopsy cavity.   Pathology from that Accession no [id number] showed Ductal cancer and DCIS. ER 100%, PR 67%, HER 2 by CISH showed amplification RESULT RATIO OF HER2: CEP 17 SIGNALS 2.80, AVERAGE HER2 COPY NUMBER PER CELL 3.50. So it is showing the same phenotype that is Triple positive Breast cancer or LUMINAL B subtype.  A second-look Ultrasound was performed on 11/12/2013, showing normal fibroglandular and fibrofatty tissue. No enlarged or abnormal appearing lymph nodes are seen. No normal lymph nodes are not discretely visualized.IMPRESSION: Negative exam. Lymph node noted on the current breast MRI is not visualized sonographically.  Clinically and based on radiographic data so far it looks like a stage III-A breast cancer (size max 5.1 cm T3, possible+/- nodal involvement N1).   Patient is here to finalize her chemotherapy plans. Her mediport is being setup for 11/20/2013. She has an echocardiogram scheduled today. She did attend the chemotherapy education class along with her husband. We did basline labs on her today including CBC, CMET, Tumor markers and Vitamin D level. We also discussed the chemoregimen in detail for TCH-P.  In terms of breast cancer risk profile:   She menarched at early age of 27 and went to menopause at age 56 when she had a partial hysterectomy (1995) and one ovary removed (for endometriosis), following year she total hysterectomy and other ovary removed as well (1996). She had 1 pregnancy, her first child was born at age 45.  Breast feeding history I did not ask.  She received birth control pills and shots off and on for 20 years.  She was never exposed to fertility medications but did take hormone replacement therapy (Premarin) for about 5 years ending in 2001. She has NO family history of Breast/GYN/GI cancer. Last screening colonoscopy was 2007 and she had a bone density done 06/13/2008 at [place] office showing a  normal BMD by WHO criteria. Last pap done 1997.  MEDICAL HISTORY:  Past Medical History  Diagnosis Date  . Breast cancer   . Diabetes mellitus without complication   . Hypertension   . Alopecia areata   . Endometriosis   . Hypercholesteremia     SURGICAL HISTORY: Past Surgical History  Procedure Laterality Date  . Abdominal hysterectomy      BSO   Partial hysterectomy 1995 and total hysterectomy with oophorectomy 1996 for endometriosis.  SOCIAL HISTORY: History   Social History  . Marital Status: Married    Spouse Name: Willaim Rayas. Foust    Number of Children: N/A  . Years of Education: N/A   Occupational History  . House wife    Social History Main Topics  . Smoking status: Never Smoker   . Smokeless tobacco: Not on file  . Alcohol Use: No  . Drug Use: No  . Sexual Activity: Yes    Birth Control/ Protection: Post-menopausal   Other Topics Concern  . Not on file   Social History Narrative   Patient lives in [place], [place]. She is a housewife. Both her parents are deceased father at age 63 and mother at age 80. Patient have 2 brothers and 2 sisters. She has a daughter Lakewood who lives in [place], [place].  Patient has good family support. She does not smoke or consume alcohol and no use of recreational drugs.    Patient is planning to tell her daughter and sister about her diagnosis on this weekend.  FAMILY HISTORY: Family History  Problem Relation Age of Onset  . Diabetes Mother   . Coronary artery disease Mother   . Diabetes Father   . Diabetes Sister   . Diabetes Brother   . Diabetes Brother    No history of cancer and her breast cancer does not appear to be hereditary type rather sporadic type.  ALLERGIES:  is allergic to eggs or egg-derived products; sulfa antibiotics; tetanus toxoids; multihance; and penicillins. They asked me about the flu shot but I will not MEDICATIONS:  Current Outpatient Prescriptions  Medication Sig Dispense Refill    . aspirin 81 MG tablet Take 81 mg by mouth 2 (two) times a week.       . calcium citrate-vitamin D (CITRACAL+D) 315-200 MG-UNIT per tablet Take 1 tablet by mouth daily.      . clobetasol cream (TEMOVATE) 5.53 % Apply 1 application topically as needed.      . Garlic 7482 MG CAPS Take 1 capsule by mouth 4 (four) times daily.      . hydrochlorothiazide (HYDRODIURIL) 25 MG tablet Take 25 mg by mouth daily.      . minoxidil (ROGAINE) 2 % external solution Apply topically as needed.      . Multiple Vitamin (MULTIVITAMIN) tablet Take 1 tablet by mouth daily.      . Omega-3 Fatty Acids (FISH OIL) 1000 MG CAPS Take 1 capsule by mouth daily.      . Soy Isoflavone 100 MG CAPS Take 1 capsule by mouth daily.      Marland Kitchen dexamethasone (DECADRON) 4 MG tablet Take one tablet (4 mg) twice a day. Take day before chemo, day of chemo and two days after chemotherapy for nausea.  30 tablet  2  . prochlorperazine (COMPAZINE) 10 MG tablet Take 1 tablet (10 mg total) by mouth every 6 (six) hours as needed for nausea or vomiting.  30 tablet  2   No current facility-administered medications for this visit.    REVIEW OF SYSTEMS:   Constitutional: Denies fevers, chills or abnormal night sweats Eyes: Denies blurriness of vision, double vision or watery eyes Ears, nose, mouth, throat, and face: Denies mucositis or sore throat Respiratory: Denies cough, dyspnea or wheezes Cardiovascular: Denies palpitation, chest discomfort or lower extremity swelling Gastrointestinal:  Denies nausea, heartburn or change in bowel habits Skin: Denies abnormal skin rashes Lymphatics: Denies new lymphadenopathy or easy bruising Neurological:Denies numbness, tingling or new weaknesses Behavioral/Psych: Mood is stable, no new changes  All other systems were reviewed with the patient and are negative.  PHYSICAL EXAMINATION:  ECOG PERFORMANCE STATUS: 1, KPS 100  Filed Vitals:   11/15/13 0925  BP: 122/73  Pulse: 85  Temp: 98.5 F (36.9 C)   Resp: 18   Filed Weights   11/15/13 0925  Weight: 168 lb 8 oz (76.431 kg)    GENERAL:alert, no distress and comfortable SKIN: skin color, texture, turgor are normal, no rashes or significant lesions EYES: normal, conjunctiva are pink and non-injected, sclera clear OROPHARYNX:no exudate, no erythema and lips, buccal mucosa, and tongue normal  NECK: supple, thyroid normal size, non-tender, without nodularity LYMPH:  no palpable lymphadenopathy in the cervical, axillary or inguinal LUNGS: clear to auscultation and percussion with normal breathing effort HEART: regular rate & rhythm and no  murmurs and no lower extremity edema ABDOMEN:abdomen soft, non-tender and normal bowel sounds Musculoskeletal:no cyanosis of digits and no clubbing  PSYCH: alert & oriented x 3 with fluent speech NEURO: no focal motor/sensory deficits BREAST EXAM: Right breast fibrocystic feeling, no discrete or palpable mass noted, no skin or nipple changes, no adenopathy. Left breast biopsy site is swollen and tender, Underlying mass can be felt about 3 cm on exam, no nodes felt in axilla. No supraclavicular or cervical LN felt on either side. Skin tear superficial from where she had an adhesive tape.  LABORATORY DATA:               VITAMIN D LEVEL: Pending  From Dr polite's note and labs from 05/14/2013:  Hemoglobin A1C 6.1, Glucose 77, bun 16, creatinine 0.92, sodium 142, k 4.3, chloride 103, CO2 29, calcium 10.3, albumin 4.2, total bili 0.7, Alk phos 57, AST 22, ALT 18, TSH 1.17, Cholesterol 222, LDL 140, HDL 56  Last CBC 05/09/2012 WBC 5.6, HEMOGLOBIN 13.1 HCT 41 PLATELETS 161 MCV 87  RADIOGRAPHIC STUDIES:  I have personally reviewed the radiological images as listed above and agreed with the findings in the report.These included Mammograms, Ultrasound, MRI breast and report on Bone density test.  PATHOLOGY DATA:  Reviewed.            ASSESSMENT:   1. This is a very pleasant 58  years-old Afro-american female from [facility] who presents with a left breast mass. This was picked on a mammogram and maximal diameter was 1.7 cm on Ultrasound however the MRI breast showed this mass to be large almost 5.1 cm in maximal dimension making it a clinical T3 tumor. Also it showed a suspicious looking solitary lymph node in axilla and that can make it a Clinical Stage III-A tumor.  2. The tumor biology indicates a LUMINAL-B type which means ER/PR/HER2 + tumor. Tumor is strongly ER+100%, PR 42%,KI-67 Index 53% showing a brisk mitotic rate and grade is called grade 2 (but grading as per pathologist cannot be relied upon on initial biopsy).  3. I saw her for consideration of neoadjuvant chemotherapy and she would be a good candidate for that.  4. She underwent a repeat biopsy from breast on 11/09/2013 anteriorly and that confirmed the same triple positive breast cancer or Luminal B type. I will strongly recommend neoadjuvant chemotherapy based on that. The second look [place] did not show a LN.  5. she can benefit from neoadjuvant chemotherapy esp since her tumor is her-2 positive and neoadjuvant chemotherapy has the potential of downsizing her tumor and making her a lumpectomy candidate. Also we can get an idea about the clinical behavior of the tumor in terms of the pathologic response caused by chemotherapy. Lot of these her2 + tumors respond dramatically and can lead to complete pathological response which then gives her a very good chance for remission and perhaps a cure.  6. The chemo-regimen I am proposing to use is a NCCN-endorsed preferred regimen which has very good activity but avoids the cardiotoxicity and leukemogenesis risk associated with using anthracyclines. We use Taxotere and Carboplatin as the backbone chemotherapy and use Trastuzumab (Herceptin) and pertuzumab (Perjeta) as the her-2 directed agents. So all 4 agents are used what we call as TCH-P regimen.  7. The data for this  comes from [facility] trial which is a phase 2 neoadjuvant study looking at chemotherapies with and without anthracyclines. The design of this trial was     8. As noted  above the complete PCR rate with using TCH-P was 63.6% which is very impressive and incidence of left ventricular dysfunction was only 2.6%.  9. Al-Hilli Z et al reported in July 2015 Ann Surg Oncol. that there is even an increase in use of NACT (neoadjuvant chemotherapy) for T1 and T2 HER2+ tumors using combination of dual her 2 blockade.  10. An interesting paper from [place] in Breast Cancer June 2015 shows that combination of pertuzumab, trastuzumab and docetaxel reduces the recurrence of brain metastasis from breast cancer. This is important as brain is one sanctuary site where her 2 + cancers are failing in general.  11. A review article in [place] [facility] October 25 2013 by [facility] et al also captures the evolving landscape of her2 targeting in breast cancer and discuss the benefits of NACT using dual blockade.  12. PCR is emerging as an important surrogate endpoint and preliminary data from [place] studies also point in that direction.  13. Lastly the date from 2 [employer] study suggests that patients who receive pertuzumab in combination with trastuzumab and docetaxel prior to surgery were 31% less likely to experience disease worsening, recurrence or death (Hazard ratio for PFS 0.69) compared with those who receive trastuzumab and chemotherapy. Nearly 40% of patients receiving the regimen containing pertuzumab achieved complete PCR compared with 21.55 in the other treatment arm. FDA has already approved this for NACT in 2013 and EU about to approve it.  14. The most common grade 3 adverse events for pertuzumab plus doctaxel and herceptin are neutropenia 45%, febrile neutropenia 8.4% and diarrhea about 6%. Other adverse efefcts which can be seen are decreased cardiac function, infusion related reactions,  hypersensitivity reactions and anaphylaxis. In [facility] study which led to NCCN endorsement all grade diarrhea was 66% and something we will need to monitor and treat aggressively.     PLAN:   1. Patient underwent biopsy of breast enhancing mass anteriorly consistent with same pathology.   2. Order a 2-D echo to assess cardiac function. She has three risk factors for cardiovascular disease HTN, hyperlipidemia and pre-diabetes state, strong FH of diabetes in 5 family members. We will also arrange a consult with Cardiologist to monitor her once she starts Herceptin and Perjeta combination. She is getting an echo today.  3. Dr Barry Dienes to place a mediport on 11/20/2013.We will call an EMLA cream for it.  4. I will plan 6 cycles of TCH-P (Taxotere, Carboplatin,Herceptin,Perjeta) and support her with Neulasta.  5. She will continue herceptin fo a year so that will go through August 2016 barring any cardiac issues which I do not anticipate.  6. After 6 cycles we will do another restaging MRI breast.  7. This would be followed by a surgery hopefully a lumpectomy with SLN biopsy.   8. If she attains a complete PCR, we will offer her adjuvant radiation therapy.  9. Anti-estrogen therapy to follow that with preference to Aromatase inhibitors.  10. Patient could have qualified for NSABP 52 TRIAL which is a phase 3 trial randomizing ER+/HER2+ tumors to [facility] & Pertuzumab or [facility] & Pertuzumab & hormonal rx. This trial is currently showing as temporarily suspended but I will inquire with the research nurses.  11. Side effects of chemo were discussed in detail and they include but are not limited to hair loss, nausea, vomiting, alopecia  (she already have hx of alopecia areata), diarrhea (esp after incorporating pertuzumab along with docetaxel), cardiac decline in EF which is  reversible after holding herceptin, nail changes, excessive lacrimation, fluid retention and leg edema, myelosuppression that is  neutropenia, anemia and thrombocytoepnia and associated complications, neuropathy from taxotere and neulasta related side effects etc.She was given written material on all chemo drugs today.  12. She will need a cardiac monitoring q 3 months while on herceptin protocol.  13. Patient attended a chemotherapy education class.  14.  The intention and goal for this treatment is curative and patient is willing to move forward with a very positive attitude and we will do our best to support her during the whole process.  15. She was given prescriptions on Compazine (prn for nausea), Dexamethasone 4 mg bid x 4 days to start day before chemo and then for 4 days for nausea as well as for preventing a reaction from Taxotere and perjeta. I will also use Aloxi and emend as antiemetics. She will get imodium OTC prn for diarrhea. Side effects were again discussed with patient.   16. Patient and her husband [name] Pickerel going to discuss this with her daughter and sister and little anxious about it.  17. She will return to clinic on 11/26/2013 to start her therapy and will be seen by me that am.  All questions were answered. The patient knows to call the clinic with any problems, questions or concerns.  I spent 60 minutes counseling the patient face to face.   Bernadene Bell, MD Medical Hematologist/Oncologist [place] Pager: [phone] Office No: [phone]  11/15/2013 2:14 PM

## 2013-11-16 ENCOUNTER — Encounter: Payer: Self-pay | Admitting: *Deleted

## 2013-11-16 LAB — VITAMIN D 25 HYDROXY (VIT D DEFICIENCY, FRACTURES): VIT D 25 HYDROXY: 46 ng/mL (ref 30–89)

## 2013-11-16 LAB — CANCER ANTIGEN 15-3: Cancer Antigen-Breast 15-3: 16 U/mL (ref <32)

## 2013-11-16 LAB — CANCER ANTIGEN 27.29: CA 27.29: 23 U/mL (ref 0–39)

## 2013-11-16 NOTE — Progress Notes (Signed)
[facility] Psychosocial Distress Screening Clinical Social Work  Clinical Social Work was referred by distress screening protocol.  The patient scored a 6 on the Psychosocial Distress Thermometer which indicates moderate distress. Clinical Social Worker phoned pt to assess for distress and other psychosocial needs. Pt reports she is has recently had a chance to organize "all my information" and plans to seek out support programs once her surgery is scheduled. She has connected with [facility] as well. She plans to talk with her daughter today and are surrounded by supportive family. Pt appears to have good supports and plans to reach out as needed.  She was very appreciative of the call and agrees to reach out to the Support Team as needed.   ONCBCN DISTRESS SCREENING 11/15/2013  Screening Type Other (comment)  Mark the number that describes how much distress you have been experiencing in the past week 6  Family Problem type   Emotional problem type Adjusting to illness  Physician notified of physical symptoms   Referral to clinical psychology   Referral to clinical social work Yes  Referral to dietition   Referral to financial advocate   Referral to support programs   Referral to palliative care     Clinical Social Worker follow up needed:   If yes, follow up plan: See above  Loren Racer, [place]. [place] for [place] Wednesday, Thursday and Friday Phone: [phone] Fax: [phone]

## 2013-11-19 ENCOUNTER — Encounter (HOSPITAL_COMMUNITY): Payer: Self-pay | Admitting: *Deleted

## 2013-11-19 MED ORDER — CIPROFLOXACIN IN D5W 400 MG/200ML IV SOLN
400.0000 mg | INTRAVENOUS | Status: AC
  Administered 2013-11-20: 400 mg via INTRAVENOUS
  Filled 2013-11-19: qty 200

## 2013-11-20 ENCOUNTER — Ambulatory Visit (HOSPITAL_COMMUNITY): Payer: Managed Care, Other (non HMO)

## 2013-11-20 ENCOUNTER — Encounter (HOSPITAL_COMMUNITY)
Admission: RE | Disposition: A | Discharge status: Home / Self Care | Payer: Self-pay | Source: Ambulatory Visit | Attending: General Surgery

## 2013-11-20 ENCOUNTER — Encounter (HOSPITAL_COMMUNITY): Payer: Managed Care, Other (non HMO) | Admitting: Anesthesiology

## 2013-11-20 ENCOUNTER — Encounter (HOSPITAL_COMMUNITY): Payer: Self-pay | Admitting: General Surgery

## 2013-11-20 ENCOUNTER — Ambulatory Visit (HOSPITAL_COMMUNITY)
Admission: RE | Admit: 2013-11-20 | Discharge: 2013-11-20 | Disposition: A | Discharge status: Home / Self Care | Payer: Managed Care, Other (non HMO) | Source: Ambulatory Visit | Attending: General Surgery | Admitting: General Surgery

## 2013-11-20 ENCOUNTER — Ambulatory Visit (HOSPITAL_COMMUNITY): Payer: Managed Care, Other (non HMO) | Admitting: Anesthesiology

## 2013-11-20 DIAGNOSIS — E119 Type 2 diabetes mellitus without complications: Secondary | ICD-10-CM | POA: Diagnosis not present

## 2013-11-20 DIAGNOSIS — L639 Alopecia areata, unspecified: Secondary | ICD-10-CM | POA: Diagnosis not present

## 2013-11-20 DIAGNOSIS — Z7982 Long term (current) use of aspirin: Secondary | ICD-10-CM | POA: Diagnosis not present

## 2013-11-20 DIAGNOSIS — Z79899 Other long term (current) drug therapy: Secondary | ICD-10-CM | POA: Diagnosis not present

## 2013-11-20 DIAGNOSIS — C50419 Malignant neoplasm of upper-outer quadrant of unspecified female breast: Secondary | ICD-10-CM | POA: Insufficient documentation

## 2013-11-20 DIAGNOSIS — I10 Essential (primary) hypertension: Secondary | ICD-10-CM | POA: Insufficient documentation

## 2013-11-20 DIAGNOSIS — C50919 Malignant neoplasm of unspecified site of unspecified female breast: Secondary | ICD-10-CM

## 2013-11-20 DIAGNOSIS — E7800 Pure hypercholesterolemia, unspecified: Secondary | ICD-10-CM | POA: Diagnosis not present

## 2013-11-20 DIAGNOSIS — Z90710 Acquired absence of both cervix and uterus: Secondary | ICD-10-CM | POA: Insufficient documentation

## 2013-11-20 HISTORY — PX: PORTACATH PLACEMENT: SHX2246

## 2013-11-20 LAB — GLUCOSE, CAPILLARY
GLUCOSE-CAPILLARY: 98 mg/dL (ref 70–99)
Glucose-Capillary: 90 mg/dL (ref 70–99)

## 2013-11-20 SURGERY — INSERTION, TUNNELED CENTRAL VENOUS DEVICE, WITH PORT
Anesthesia: General | Site: Chest

## 2013-11-20 MED ORDER — OXYCODONE HCL 5 MG PO TABS: 5.0000 mg | ORAL_TABLET | ORAL | Status: DC | PRN

## 2013-11-20 MED ORDER — PROPOFOL 10 MG/ML IV BOLUS
INTRAVENOUS | Status: AC
  Filled 2013-11-20: qty 20

## 2013-11-20 MED ORDER — LACTATED RINGERS IV SOLN
INTRAVENOUS | Status: DC | PRN
  Administered 2013-11-20: 07:00:00 via INTRAVENOUS

## 2013-11-20 MED ORDER — FENTANYL CITRATE 0.05 MG/ML IJ SOLN
INTRAMUSCULAR | Status: DC | PRN
  Administered 2013-11-20: 50 ug via INTRAVENOUS

## 2013-11-20 MED ORDER — LIDOCAINE HCL 1 % IJ SOLN
INTRAMUSCULAR | Status: DC | PRN
  Administered 2013-11-20: 07:00:00

## 2013-11-20 MED ORDER — LIDOCAINE HCL (CARDIAC) 20 MG/ML IV SOLN
INTRAVENOUS | Status: AC
  Filled 2013-11-20: qty 5

## 2013-11-20 MED ORDER — ONDANSETRON HCL 4 MG/2ML IJ SOLN
INTRAMUSCULAR | Status: AC
  Filled 2013-11-20: qty 2

## 2013-11-20 MED ORDER — ONDANSETRON HCL 4 MG/2ML IJ SOLN
INTRAMUSCULAR | Status: DC | PRN
  Administered 2013-11-20: 4 mg via INTRAVENOUS

## 2013-11-20 MED ORDER — HYDROCODONE-ACETAMINOPHEN 5-325 MG PO TABS: 1.0000 | ORAL_TABLET | ORAL | Status: DC | PRN

## 2013-11-20 MED ORDER — OXYCODONE HCL 5 MG PO TABS: 5.0000 mg | ORAL_TABLET | Freq: Once | ORAL | Status: DC | PRN

## 2013-11-20 MED ORDER — MIDAZOLAM HCL 2 MG/2ML IJ SOLN
INTRAMUSCULAR | Status: AC
  Filled 2013-11-20: qty 2

## 2013-11-20 MED ORDER — LIDOCAINE HCL (CARDIAC) 20 MG/ML IV SOLN
INTRAVENOUS | Status: DC | PRN
  Administered 2013-11-20: 40 mg via INTRAVENOUS

## 2013-11-20 MED ORDER — HYDROMORPHONE HCL PF 1 MG/ML IJ SOLN: 0.2500 mg | INTRAMUSCULAR | Status: DC | PRN

## 2013-11-20 MED ORDER — BUPIVACAINE-EPINEPHRINE (PF) 0.25% -1:200000 IJ SOLN
INTRAMUSCULAR | Status: AC
  Filled 2013-11-20: qty 30

## 2013-11-20 MED ORDER — MIDAZOLAM HCL 5 MG/5ML IJ SOLN
INTRAMUSCULAR | Status: DC | PRN
  Administered 2013-11-20: 2 mg via INTRAVENOUS

## 2013-11-20 MED ORDER — DEXAMETHASONE SODIUM PHOSPHATE 4 MG/ML IJ SOLN
INTRAMUSCULAR | Status: AC
  Filled 2013-11-20: qty 1

## 2013-11-20 MED ORDER — SODIUM CHLORIDE 0.9 % IR SOLN
Status: DC | PRN
  Administered 2013-11-20: 07:00:00

## 2013-11-20 MED ORDER — 0.9 % SODIUM CHLORIDE (POUR BTL) OPTIME
TOPICAL | Status: DC | PRN
  Administered 2013-11-20: 1000 mL

## 2013-11-20 MED ORDER — OXYCODONE HCL 5 MG/5ML PO SOLN: 5.0000 mg | Freq: Once | ORAL | Status: DC | PRN

## 2013-11-20 MED ORDER — PROPOFOL 10 MG/ML IV BOLUS
INTRAVENOUS | Status: DC | PRN
  Administered 2013-11-20: 130 mg via INTRAVENOUS

## 2013-11-20 MED ORDER — HEPARIN SOD (PORK) LOCK FLUSH 100 UNIT/ML IV SOLN
INTRAVENOUS | Status: DC | PRN
  Administered 2013-11-20: 500 [IU] via INTRAVENOUS

## 2013-11-20 MED ORDER — HEPARIN SOD (PORK) LOCK FLUSH 100 UNIT/ML IV SOLN
INTRAVENOUS | Status: AC
  Filled 2013-11-20: qty 5

## 2013-11-20 MED ORDER — LIDOCAINE HCL (PF) 1 % IJ SOLN
INTRAMUSCULAR | Status: AC
  Filled 2013-11-20: qty 30

## 2013-11-20 MED ORDER — FENTANYL CITRATE 0.05 MG/ML IJ SOLN
INTRAMUSCULAR | Status: AC
  Filled 2013-11-20: qty 5

## 2013-11-20 SURGICAL SUPPLY — 57 items
ADH SKN CLS APL DERMABOND .7 (GAUZE/BANDAGES/DRESSINGS) ×1
BAG DECANTER FOR FLEXI CONT (MISCELLANEOUS) ×5 IMPLANT
BLADE SURG 11 STRL SS (BLADE) ×2 IMPLANT
BLADE SURG 15 STRL LF DISP TIS (BLADE) IMPLANT
BLADE SURG 15 STRL SS (BLADE) ×3
CANISTER SUCTION 2500CC (MISCELLANEOUS) IMPLANT
CHLORAPREP W/TINT 10.5 ML (MISCELLANEOUS) ×3 IMPLANT
COVER SURGICAL LIGHT HANDLE (MISCELLANEOUS) ×3 IMPLANT
COVER TRANSDUCER ULTRASND (DRAPES) IMPLANT
CRADLE DONUT ADULT HEAD (MISCELLANEOUS) ×3 IMPLANT
DECANTER SPIKE VIAL GLASS SM (MISCELLANEOUS) ×6 IMPLANT
DERMABOND ADVANCED (GAUZE/BANDAGES/DRESSINGS) ×2
DERMABOND ADVANCED .7 DNX12 (GAUZE/BANDAGES/DRESSINGS) ×1 IMPLANT
DRAPE C-ARM 42X72 X-RAY (DRAPES) ×3 IMPLANT
DRAPE CHEST BREAST 15X10 FENES (DRAPES) ×3 IMPLANT
DRAPE UTILITY 15X26 W/TAPE STR (DRAPE) ×6 IMPLANT
DRAPE WARM FLUID 44X44 (DRAPE) IMPLANT
ELECT COATED BLADE 2.86 ST (ELECTRODE) ×3 IMPLANT
ELECT REM PT RETURN 9FT ADLT (ELECTROSURGICAL) ×3
ELECTRODE REM PT RTRN 9FT ADLT (ELECTROSURGICAL) ×1 IMPLANT
GAUZE SPONGE 4X4 16PLY XRAY LF (GAUZE/BANDAGES/DRESSINGS) ×3 IMPLANT
GLOVE BIO SURGEON STRL SZ 6 (GLOVE) ×3 IMPLANT
GLOVE BIO SURGEON STRL SZ7.5 (GLOVE) ×2 IMPLANT
GLOVE BIOGEL PI IND STRL 6.5 (GLOVE) ×1 IMPLANT
GLOVE BIOGEL PI IND STRL 7.0 (GLOVE) IMPLANT
GLOVE BIOGEL PI INDICATOR 6.5 (GLOVE) ×2
GLOVE BIOGEL PI INDICATOR 7.0 (GLOVE) ×2
GLOVE SURG SS PI 7.0 STRL IVOR (GLOVE) ×2 IMPLANT
GOWN STRL REUS W/ TWL LRG LVL3 (GOWN DISPOSABLE) ×1 IMPLANT
GOWN STRL REUS W/TWL 2XL LVL3 (GOWN DISPOSABLE) ×3 IMPLANT
GOWN STRL REUS W/TWL LRG LVL3 (GOWN DISPOSABLE) ×3
KIT BASIN OR (CUSTOM PROCEDURE TRAY) ×3 IMPLANT
KIT PORT POWER 8FR ISP CVUE (Catheter) ×2 IMPLANT
KIT PORT POWER 9.6FR MRI PREA (Catheter) IMPLANT
KIT PORT POWER ISP 8FR (Catheter) IMPLANT
KIT POWER CATH 8FR (Catheter) IMPLANT
KIT ROOM TURNOVER OR (KITS) ×3 IMPLANT
NDL HYPO 25GX1X1/2 BEV (NEEDLE) ×1 IMPLANT
NEEDLE HYPO 25GX1X1/2 BEV (NEEDLE) ×3 IMPLANT
NS IRRIG 1000ML POUR BTL (IV SOLUTION) ×3 IMPLANT
PACK SURGICAL SETUP 50X90 (CUSTOM PROCEDURE TRAY) ×3 IMPLANT
PAD ARMBOARD 7.5X6 YLW CONV (MISCELLANEOUS) ×3 IMPLANT
PENCIL BUTTON HOLSTER BLD 10FT (ELECTRODE) ×3 IMPLANT
SUT MON AB 4-0 PC3 18 (SUTURE) ×3 IMPLANT
SUT PROLENE 2 0 SH DA (SUTURE) ×6 IMPLANT
SUT VIC AB 3-0 SH 27 (SUTURE) ×3
SUT VIC AB 3-0 SH 27X BRD (SUTURE) ×1 IMPLANT
SYR 20ML ECCENTRIC (SYRINGE) ×6 IMPLANT
SYR 5ML LUER SLIP (SYRINGE) ×3 IMPLANT
SYR BULB 3OZ (MISCELLANEOUS) ×2 IMPLANT
SYR CONTROL 10ML LL (SYRINGE) ×3 IMPLANT
TOWEL OR 17X24 6PK STRL BLUE (TOWEL DISPOSABLE) ×3 IMPLANT
TOWEL OR 17X26 10 PK STRL BLUE (TOWEL DISPOSABLE) ×3 IMPLANT
TUBE CONNECTING 12'X1/4 (SUCTIONS) ×1
TUBE CONNECTING 12X1/4 (SUCTIONS) ×1 IMPLANT
WATER STERILE IRR 1000ML POUR (IV SOLUTION) IMPLANT
YANKAUER SUCT BULB TIP NO VENT (SUCTIONS) ×2 IMPLANT

## 2013-11-20 NOTE — Op Note (Signed)
PREOPERATIVE DIAGNOSIS:  Left breast cancer     POSTOPERATIVE DIAGNOSIS:  Same     PROCEDURE: Left subclavian port placement, Bard ClearVue  Power Port, MRI safe, 8-French.      SURGEON:  [name], MD      ANESTHESIA:  General   FINDINGS:  Good venous return, easy flush, and tip of the catheter and   SVC 22.5 cm.      SPECIMEN:  None.      ESTIMATED BLOOD LOSS:  Minimal.      COMPLICATIONS:  None known.      PROCEDURE:  Pt was identified in the holding area and taken to   the operating room, where patient was placed supine on the operating room   table.  General LMA anesthesia was induced.  Patient's arms were tucked and the upper   chest and neck were prepped and draped in sterile fashion.  Time-out was   performed according to the surgical safety check list.  When all was   correct, we continued.   Local anesthetic was administered over this   area at the angle of the clavicle.  The vein was accessed with 1 pass of the needle, but the wire passed into the neck.  This occurred again at a different angle.  The subclavian vein was accessed again easily, and this time there was good venous return and the wire passed easily with ectopy. PVCs resolved once the wire was pulled back a bit.  Fluoroscopy was used to confirm that the wire was in the vena cava.      The patient was placed back level and the area for the pocket was anethetized   with local anesthetic.  A 3-cm transverse incision was made with a #15   blade.  Cautery was used to divide the subcutaneous tissues down to the   pectoralis muscle.  An [employer] retractor was used to elevate the skin   while a pocket was created on top of the pectoralis fascia.  The port   was placed into the pocket to confirm that it was of adequate size.  The   catheter was preattached to the port.  The port was then secured to the   pectoralis fascia with four 2-0 Prolene sutures.  These were clamped and   not tied down yet.    The  catheter was tunneled through to the wire exit   site.  The catheter was placed along the wire to determine what length it should be to be in the SVC.  The catheter was cut at 22.5 cm.  The tunneler sheath and dilator were passed over the wire and the dilator and wire were removed.  The catheter was advanced through the tunneler sheath and the tunneler sheath was pulled away.  Care was taken to keep the catheter in the tunneler sheath as this occurred. This was advanced and the tunneler sheath was removed.  There was good venous   return and easy flush of the catheter.  The Prolene sutures were tied   down to the pectoral fascia.  The skin was reapproximated using 3-0   Vicryl interrupted deep dermal sutures.    Fluoroscopy was used to re-confirm good position of the catheter.  The skin   was then closed using 4-0 Monocryl in a subcuticular fashion.  The port was flushed with concentrated heparin flush as well.  The wounds were then cleaned, dried, and dressed with Dermabond.  The patient was awakened from anesthesia  and taken to the PACU in stable condition.  Needle, sponge, and instrument counts were correct.               [name], MD

## 2013-11-20 NOTE — H&P (View-Only) (Signed)
Chief complaint:  New left breast cancer  Referring MD: Dr. Delfina Redwood.    HISTORY:  Patient is a 58 year old female who is referred by Dr. Cinda Quest for evaluation and consultation regarding a new left breast cancer. She presented with an area of scarring distortion on her left breast. She underwent diagnostic mammogram and biopsy and was found to have a cancer. 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  She does not have any family history of cancer. She has been getting mammograms regularly. She had menarche at age 68-12. She is postmenopausal. She did use hormone replacement for approximately 5 years. She had one child at age 85. She used hormonal contraception on and off for 20 years. She is up-to-date with her colonoscopy and bone density study.  Past Medical History  Diagnosis Date  . Breast cancer   . Diabetes mellitus without complication   . Hypertension   . Alopecia areata   . Endometriosis   . Hypercholesteremia     Past Surgical History  Procedure Laterality Date  . Abdominal hysterectomy      BSO    Current Outpatient Prescriptions  Medication Sig Dispense Refill  . aspirin 81 MG tablet Take 81 mg by mouth daily.      . calcium citrate-vitamin D (CITRACAL+D) 315-200 MG-UNIT per tablet Take 1 tablet by mouth daily.      . clobetasol cream (TEMOVATE) 6.28 % Apply 1 application topically as needed.      . hydrochlorothiazide (HYDRODIURIL) 25 MG tablet Take 25 mg by mouth daily.      . minoxidil (ROGAINE) 2 % external solution Apply topically as needed.      . Multiple Vitamin (MULTIVITAMIN) tablet Take 1 tablet by mouth daily.       No current facility-administered medications for this visit.     Allergies  Allergen Reactions  . Sulfa Antibiotics Swelling  . Tetanus Toxoids   . Multihance [Gadobenate] Nausea And Vomiting  . Penicillins Rash     Family History  Problem Relation Age of Onset  . Diabetes Mother   . Coronary artery disease Mother   . Diabetes  Father   . Diabetes Sister   . Diabetes Brother   . Diabetes Brother      History   Social History  . Marital Status: Married    Spouse Name: N/A    Number of Children: N/A  . Years of Education: N/A   Occupational History  . House wife    Social History Main Topics  . Smoking status: Never Smoker   . Smokeless tobacco: Not on file  . Alcohol Use: No  . Drug Use: No  . Sexual Activity: Yes    Birth Control/ Protection: Post-menopausal   Other Topics Concern  . Not on file   Social History Narrative  . No narrative on file     REVIEW OF SYSTEMS - PERTINENT POSITIVES ONLY: 12 point review of systems negative other than HPI and PMH except for glasses, and diabetes.    EXAM: Wt Readings from Last 3 Encounters:  11/07/13 167 lb 12.8 oz (76.114 kg)   Temp Readings from Last 3 Encounters:  11/07/13 98.4 F (36.9 C)    BP Readings from Last 3 Encounters:  11/07/13 134/80   Pulse Readings from Last 3 Encounters:  11/07/13 70     Wt Readings from Last 3 Encounters:  11/07/13 167 lb 12.8 oz (76.114 kg)     Gen:  No acute distress.  Well nourished  and well groomed.   Neurological: Alert and oriented to person, place, and time. Coordination normal.  Head: Normocephalic and atraumatic.  Eyes: Conjunctivae are normal. Pupils are equal, round, and reactive to light. No scleral icterus.  Neck: Normal range of motion. Neck supple. No tracheal deviation or thyromegaly present.  Cardiovascular: Normal rate, regular rhythm, normal heart sounds and intact distal pulses.  Exam reveals no gallop and no friction rub.  No murmur heard. Breast: hematoma at site of biopsy of left.  Some tethered skin here, but no dimpling.  No palpable masses. No nipple retraction or skin dimpling on either side.  Dense breast tissue bilaterally.  No LAD.     Respiratory: Effort normal.  No respiratory distress. No chest wall tenderness. Breath sounds normal.  No wheezes, rales or rhonchi.  GI:  Soft. Bowel sounds are normal. The abdomen is soft and nontender.  There is no rebound and no guarding.  Musculoskeletal: Normal range of motion. Extremities are nontender.  Lymphadenopathy: No cervical, preauricular, postauricular or axillary adenopathy is present Skin: Skin is warm and dry. No rash noted. No diaphoresis. No erythema. No pallor. No clubbing, cyanosis, or edema.   Psychiatric: Normal mood and affect. Behavior is normal. Judgment and thought content normal.    LABORATORY RESULTS: Available labs are reviewed  Pathology Diagnosis Breast, left, needle core biopsy, mass, 2 o'clock 4 fn - INVASIVE DUCTAL CARCINOMA, SEE COMMENT. - DUCTAL CARCINOMA IN SITU. - CALCIFICATIONS IDENTIFIED.  RADIOLOGY RESULTS: See E-Chart or I-Site for most recent results.  Images and reports are reviewed.  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  LABS:  BUN and creatinine were obtained on site at [place] at  [address]  Results:  BUN 13 mg/dL,  Creatinine 1.0 mg/dL.  EXAM: BILATERAL BREAST MRI WITH AND WITHOUT CONTRAST  TECHNIQUE: Multiplanar, multisequence MR images of both breasts were obtained prior to and following the intravenous administration of MultiHance. The patient's original MRI was on 11/02/2013. During the exam she became nauseated and vomited. The patient was rescanned for pre and post contrast images on 11/05/2013. During both examinations the patient received 15 cc of MultiHance.  THREE-DIMENSIONAL MR IMAGE RENDERING ON INDEPENDENT WORKSTATION:  Three-dimensional MR images were rendered by post-processing of the original MR data on an independent workstation. The three-dimensional MR images were interpreted, and findings are reported in the following complete MRI report for this study. Three dimensional  images were evaluated at the independent DynaCad workstation  COMPARISON:  Previous exams  FINDINGS: Breast composition: b.  Scattered fibroglandular tissue.  Background parenchymal enhancement: Moderate  Right breast: No mass or abnormal enhancement.  Left breast: In the anterior and middle thirds of the upper and upper outer quadrant of the left breast is a heterogeneous enhancing irregular mass measuring 5 x 2.5 x 5.1 cm in transverse, anterior-posterior and craniocaudal dimensions. Signal void artifact is seen along the lateral aspect of enhancement consistent with the biopsy clip. Along the anterior aspect of the irregular enhancing mass is a more focal 1.5 cm enhancement.  Lymph nodes: There is a 9 mm level 1 left axillary lymph node that does not maintain a normal fatty hilum. It is concerning for metastatic involvement.  Ancillary findings:  None.  IMPRESSION: Irregular enhancing mass measuring 5.1 cm in the upper and upper-outer quadrants of  the left breast. The MR enhancing mass is much larger than the mass seen mammographically or sonographically. Suspicious level 1 left axillary lymph node concerning for metastatic involving.  RECOMMENDATION: MR guided core biopsy of the anterior aspect of abnormal enhancement in the left breast is recommended to prove the extent of disease. A second-look ultrasound could be performed for the lymph node seen with MR imaging with possible biopsy if clinically indicated. Otherwise I would suggest correlation with sentinel lymph node biopsy.  BI-RADS CATEGORY  6: Known biopsy-proven malignancy.   Electronically Signed   By: Lillia Mountain M.D.   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  LABS:  BUN and creatinine were obtained on site at [facility] at  [address]  Results:   BUN 13 mg/dL,  Creatinine 1.0 mg/dL.  EXAM: BILATERAL BREAST MRI WITH AND WITHOUT CONTRAST  TECHNIQUE: Multiplanar, multisequence MR images of both breasts were obtained prior to and following the intravenous administration of MultiHance. The patient's original MRI was on 11/02/2013. During the exam she became nauseated and vomited. The patient was rescanned for pre and post contrast images on 11/05/2013. During both examinations the patient received 15 cc of MultiHance.  THREE-DIMENSIONAL MR IMAGE RENDERING ON INDEPENDENT WORKSTATION:  Three-dimensional MR images were rendered by post-processing of the original MR data on an independent workstation. The three-dimensional MR images were interpreted, and findings are reported in the following complete MRI report for this study. Three dimensional images were evaluated at the independent DynaCad workstation  COMPARISON:  Previous exams  FINDINGS: Breast composition: b.  Scattered fibroglandular tissue.  Background parenchymal enhancement: Moderate  Right breast: No mass or abnormal enhancement.  Left breast: In the anterior and middle thirds of the upper and upper outer quadrant of the left breast is a heterogeneous enhancing irregular mass measuring 5 x 2.5 x 5.1 cm in transverse, anterior-posterior and craniocaudal dimensions. Signal void artifact is seen along the lateral aspect of enhancement consistent with the biopsy clip. Along the anterior aspect of the irregular enhancing mass is a more focal 1.5 cm enhancement.  Lymph nodes: There is a 9 mm level 1 left axillary lymph node that does not maintain a normal fatty hilum. It is concerning for metastatic involvement.  Ancillary findings:  None.  IMPRESSION: Irregular enhancing mass measuring 5.1 cm in the upper and upper-outer quadrants of the left breast. The MR enhancing mass is much larger than the mass seen mammographically or sonographically. Suspicious level 1 left axillary lymph node concerning for  metastatic involving.  RECOMMENDATION: MR guided core biopsy of the anterior aspect of abnormal enhancement in the left breast is recommended to prove the extent of disease. A second-look ultrasound could be performed for the lymph node seen with MR imaging with possible biopsy if clinically indicated. Otherwise I would suggest correlation with sentinel lymph node biopsy.  BI-RADS CATEGORY  6: Known biopsy-proven malignancy.   Electronically Signed   By: Lillia Mountain M.D.   On: 11/05/2013 16:14   Mm Digital Diagnostic Unilat L  10/30/2013   CLINICAL DATA:  Status post ultrasound-guided left breast biopsy 2 o'clock location distortion/mass  EXAM: DIAGNOSTIC left MAMMOGRAM POST ULTRASOUND BIOPSY  COMPARISON:  Previous exams  FINDINGS: Mammographic images were obtained following ultrasound guided biopsy of left breast mass with  distortion left breast 2 o'clock location. The wing shaped clip is appropriately located at the biopsy site.  IMPRESSION: Appropriate wing shaped clip location, left breast 2 o'clock location.  Final Assessment: Post Procedure Mammograms for Marker Placement   Electronically Signed   By: Conchita Paris M.D.   On: 10/30/2013 15:13   Mm Digital Diagnostic Unilat L  10/26/2013   CLINICAL DATA:  Screening callback for questioned distortion in the left breast  EXAM: DIGITAL DIAGNOSTIC  left MAMMOGRAM  ULTRASOUND left BREAST  COMPARISON:  [facility]  ACR Breast Density Category b: There are scattered areas of fibroglandular density.  FINDINGS: There is persistent distortion in the left upper outer quadrant, corresponding to the screening mammographic finding.  On physical exam, I palpate firmness in the left upper outer quadrant but no focal mass.  Ultrasound is performed, showing a hypoechoic irregular mass with surrounding distortion in the left breast 2 o'clock location 4 cm from the nipple measuring 1.1 x 0.9 x 0.7 cm. This corresponds to the mammographic finding. No left axillary  lymphadenopathy is identified.  IMPRESSION: Suspicious left breast mass 2 o'clock location. Ultrasound-guided core biopsy is recommended and will be scheduled at the patient's convenience.  RECOMMENDATION: Ultrasound-guided left breast biopsy  I have discussed the findings and recommendations with the patient. Results were also provided in writing at the conclusion of the visit. If applicable, a reminder letter will be sent to the patient regarding the next appointment.  BI-RADS CATEGORY  4: Suspicious.   Electronically Signed   By: Conchita Paris M.D.   On: 10/26/2013 11:37   Mm Digital Screening Bilateral  10/17/2013   CLINICAL DATA:  Screening.  EXAM: DIGITAL SCREENING BILATERAL MAMMOGRAM WITH CAD  COMPARISON:  Previous Exam(s)  ACR Breast Density Category c: The breast tissue is heterogeneously dense, which may obscure small masses.  FINDINGS: In the left breast, possible distortion warrants further evaluation with spot compression views and possibly ultrasound. In the right breast, no findings suspicious for malignancy. Images were processed with CAD.  IMPRESSION: Further evaluation is suggested for possible distortion in the left breast.  RECOMMENDATION: Diagnostic mammogram and possibly ultrasound of the left breast. (Code:[id number]-L-69M)  The patient will be contacted regarding the findings, and additional imaging will be scheduled.  BI-RADS CATEGORY  0: Incomplete. Need additional imaging evaluation and/or prior mammograms for comparison.   Electronically Signed   By: Lajean Manes M.D.   On: 10/17/2013 12:54   US Breast Ltd Uni Left Inc Axilla  10/26/2013   CLINICAL DATA:  Screening callback for questioned distortion in the left breast  EXAM: DIGITAL DIAGNOSTIC  left MAMMOGRAM  ULTRASOUND left BREAST  COMPARISON:  [facility]  ACR Breast Density Category b: There are scattered areas of fibroglandular density.  FINDINGS: There is persistent distortion in the left upper outer quadrant, corresponding to the  screening mammographic finding.  On physical exam, I palpate firmness in the left upper outer quadrant but no focal mass.  Ultrasound is performed, showing a hypoechoic irregular mass with surrounding distortion in the left breast 2 o'clock location 4 cm from the nipple measuring 1.1 x 0.9 x 0.7 cm. This corresponds to the mammographic finding. No left axillary lymphadenopathy is identified.  IMPRESSION: Suspicious left breast mass 2 o'clock location. Ultrasound-guided core biopsy is recommended and will be scheduled at the patient's convenience.  RECOMMENDATION: Ultrasound-guided left breast biopsy  I have discussed the findings and recommendations with the patient. Results were also provided in writing at the conclusion of  the visit. If applicable, a reminder letter will be sent to the patient regarding the next appointment.  BI-RADS CATEGORY  4: Suspicious.   Electronically Signed   By: Conchita Paris M.D.   On: 10/26/2013 11:37   [place] Lt Breast Bx W Loc Dev 1st Lesion Img Bx Spec US Guide  10/31/2013   ADDENDUM REPORT: 10/31/2013 12:01  ADDENDUM: Pathology revealed grade 2 invasive ductal carcinoma and ductal carcinoma in situ with calcifications in the left breast. This was found to be concordant by Dr. Conchita Paris. Pathology was discussed with the patient by telephone and her questions were answered. She reported doing well after the biopsy with minimal tenderness at the site. Post biopsy instructions and care were reviewed. Consultation has been scheduled at [facility] on November 07, 2013. She will be scheduled for a bilateral breast MRI. She was encouraged to come to [facility] for educations materials. My number was provided for future questions and concerns.  Pathology results reported by Susa Raring RN, BSN on October 31, 2013.   Electronically Signed   By: Conchita Paris M.D.   On: 10/31/2013 12:01   10/31/2013   CLINICAL DATA:   Suspicious left breast mass 2 o'clock location  EXAM: ULTRASOUND GUIDED left BREAST CORE NEEDLE BIOPSY  COMPARISON:  Previous exams.  PROCEDURE: I met with the patient and we discussed the procedure of ultrasound-guided biopsy, including benefits and alternatives. We discussed the high likelihood of a successful procedure. We discussed the risks of the procedure including infection, bleeding, tissue injury, clip migration, and inadequate sampling. Informed written consent was given. The usual time-out protocol was performed immediately prior to the procedure.  Using sterile technique and 2% Lidocaine as local anesthetic, under direct ultrasound visualization, a 12 gauge vacuum-assisteddevice was used to perform biopsy of left breast mass 2 o'clock location using a lateral to medial approach. At the conclusion of the procedure, a wing shaped tissue marker clip was deployed into the biopsy cavity. Follow-up 2-view mammogram was performed and dictated separately.  IMPRESSION: Ultrasound-guided biopsy of left breast mass 2 o'clock location with wing shaped clip placement. Pathology is pending. No apparent complications.  Electronically Signed: By: Conchita Paris M.D. On: 10/30/2013 14:53      ASSESSMENT AND PLAN: Breast cancer of upper-outer quadrant of left female breast Patient has a new diagnosis of a left-sided breast cancer. Her clinical staging is likely a T3N1, but may be a T1 N0. She has discrepancy in the size a lesion between ultrasound and MR. She also has an abnormal appearing lymph node that has a normal size. She  Is scheduled to undergo biopsy of the anterior portion of the breast lesion and the lymph node in order to determine further treatment.  If she does have a smaller lesion, we would proceed straight to surgery. If she has a larger lesion, we will recommend upfront chemotherapy. She will need a Port-A-Cath no matter which direction we go because she has a HER-2 positive tumor.  I did  discuss the option for mastectomy. There is also a possibility that the anterior medial portion of the lesion will be negative but may have an area that needs to be resected for discordance. The we'll address this is as a result.  Once her biopsy results are available, we will make a plan and set up surgery. I did review the risks of Port-A-Cath placement. I discussed pneumothorax, hemothorax, position, infection, nonfunction. I also  discussed generally mastectomy and seed localized lumpectomy with sentinel lymph node biopsy. If we are to proceed straight to breast surgery, I will see her back in my office to discuss this further. If we are going to do neoadjuvant chemotherapy, I will put her on the schedule for a Port-A-Cath.  45 min spent in evaluation, examination, counseling, and coordination of care.  >50% spent in counseling.       Milus Height MD Surgical Oncology, [facility]      Visit Diagnoses: 1. Breast cancer of upper-outer quadrant of left female breast     Primary Care Physician: [name], MD  Other care team Valere Dross, MD Arlys John, MD

## 2013-11-20 NOTE — Anesthesia Postprocedure Evaluation (Signed)
  Anesthesia Post-op Note  Patient: Rachel [name]  Procedure(s) Performed: Procedure(s): INSERTION PORT-A-CATH (N/A)  Patient Location: PACU  Anesthesia Type:General  Level of Consciousness: awake and alert   Airway and Oxygen Therapy: Patient Spontanous Breathing  Post-op Pain: none  Post-op Assessment: Post-op Vital signs reviewed, Patient's Cardiovascular Status Stable and Respiratory Function Stable  Post-op Vital Signs: Reviewed  Filed Vitals:   11/20/13 0930  BP:   Pulse:   Temp: 36.2 C  Resp:     Complications: No apparent anesthesia complications

## 2013-11-20 NOTE — Transfer of Care (Signed)
Immediate Anesthesia Transfer of Care Note  Patient: Rachel [name]  Procedure(s) Performed: Procedure(s): INSERTION PORT-A-CATH (N/A)  Patient Location: PACU  Anesthesia Type:General  Level of Consciousness: awake, alert , oriented and sedated  Airway & Oxygen Therapy: Patient Spontanous Breathing and Patient connected to nasal cannula oxygen  Post-op Assessment: Report given to PACU RN, Post -op Vital signs reviewed and stable and Patient moving all extremities  Post vital signs: Reviewed and stable  Complications: No apparent anesthesia complications

## 2013-11-20 NOTE — Anesthesia Preprocedure Evaluation (Signed)
Anesthesia Evaluation  Patient identified by MRN, date of birth, ID band Patient awake    Reviewed: Allergy & Precautions, H&P , NPO status , Patient's Chart, lab work & pertinent test results  Airway Mallampati: II TM Distance: >3 FB Neck ROM: Full    Dental no notable dental hx. (+) Teeth Intact, Dental Advisory Given   Pulmonary neg pulmonary ROS,  breath sounds clear to auscultation  Pulmonary exam normal       Cardiovascular hypertension, On Medications Rhythm:Regular Rate:Normal     Neuro/Psych negative neurological ROS  negative psych ROS   GI/Hepatic negative GI ROS, Neg liver ROS,   Endo/Other  diabetes  Renal/GU negative Renal ROS  negative genitourinary   Musculoskeletal   Abdominal   Peds  Hematology negative hematology ROS (+)   Anesthesia Other Findings   Reproductive/Obstetrics negative OB ROS                           Anesthesia Physical Anesthesia Plan  ASA: II  Anesthesia Plan: General   Post-op Pain Management:    Induction: Intravenous  Airway Management Planned: LMA  Additional Equipment:   Intra-op Plan:   Post-operative Plan: Extubation in OR  Informed Consent: I have reviewed the patients History and Physical, chart, labs and discussed the procedure including the risks, benefits and alternatives for the proposed anesthesia with the patient or authorized representative who has indicated his/her understanding and acceptance.   Dental advisory given  Plan Discussed with: CRNA  Anesthesia Plan Comments:         Anesthesia Quick Evaluation

## 2013-11-20 NOTE — Discharge Instructions (Signed)
[facility] °Office Phone Number [phone] ° ° POST OP INSTRUCTIONS ° °Always review your discharge instruction sheet given to you by the facility where your surgery was performed. ° °IF YOU HAVE DISABILITY OR FAMILY LEAVE FORMS, YOU MUST BRING THEM TO THE OFFICE FOR PROCESSING.  DO NOT GIVE THEM TO YOUR DOCTOR. ° °1. A prescription for pain medication may be given to you upon discharge.  Take your pain medication as prescribed, if needed.  If narcotic pain medicine is not needed, then you may take acetaminophen (Tylenol) or ibuprofen (Advil) as needed. °2. Take your usually prescribed medications unless otherwise directed °3. If you need a refill on your pain medication, please contact your pharmacy.  They will contact our office to request authorization.  Prescriptions will not be filled after 5pm or on week-ends. °4. You should eat very light the first 24 hours after surgery, such as soup, crackers, pudding, etc.  Resume your normal diet the day after surgery °5. It is common to experience some constipation if taking pain medication after surgery.  Increasing fluid intake and taking a stool softener will usually help or prevent this problem from occurring.  A mild laxative (Milk of Magnesia or Miralax) should be taken according to package directions if there are no bowel movements after 48 hours. °6. You may shower in 48 hours.  The surgical glue will flake off in 2-3 weeks.   °7. ACTIVITIES:  No strenuous activity or heavy lifting for 1 week.   °a. You may drive when you no longer are taking prescription pain medication, you can comfortably wear a seatbelt, and you can safely maneuver your car and apply brakes. °b. RETURN TO WORK:  __________1 week_______________ °You should see your doctor in the office for a follow-up appointment approximately three-four weeks after your surgery.   ° °WHEN TO CALL YOUR DOCTOR: °1. Fever over 101.0 °2. Nausea and/or vomiting. °3. Extreme swelling or  bruising. °4. Continued bleeding from incision. °5. Increased pain, redness, or drainage from the incision. ° °The clinic staff is available to answer your questions during regular business hours.  Please don’t hesitate to call and ask to speak to one of the nurses for clinical concerns.  If you have a medical emergency, go to the nearest emergency room or call 911.  A surgeon from [facility] is always on call at the hospital. ° °For further questions, please visit [email or web address]  ° °

## 2013-11-20 NOTE — Interval H&P Note (Signed)
History and Physical Interval Note:  11/20/2013 6:52 AM  Rachel [name]  has presented today for surgery, with the diagnosis of left breast cancer  The various methods of treatment have been discussed with the patient and family. After consideration of risks, benefits and other options for treatment, the patient has consented to  Procedure(s): INSERTION PORT-A-CATH (N/A) as a surgical intervention .  The patient's history has been reviewed, patient examined, no change in status, stable for surgery.  I have reviewed the patient's chart and labs.  Questions were answered to the patient's satisfaction.     Jeanet Lupe

## 2013-11-21 ENCOUNTER — Encounter (HOSPITAL_COMMUNITY): Payer: Self-pay | Admitting: General Surgery

## 2013-11-23 ENCOUNTER — Other Ambulatory Visit: Payer: Self-pay | Admitting: *Deleted

## 2013-11-26 ENCOUNTER — Ambulatory Visit (HOSPITAL_BASED_OUTPATIENT_CLINIC_OR_DEPARTMENT_OTHER): Payer: Managed Care, Other (non HMO)

## 2013-11-26 VITALS — BP 144/76 | HR 54 | Temp 98.0°F | Resp 18

## 2013-11-26 DIAGNOSIS — C50419 Malignant neoplasm of upper-outer quadrant of unspecified female breast: Secondary | ICD-10-CM

## 2013-11-26 DIAGNOSIS — Z5111 Encounter for antineoplastic chemotherapy: Secondary | ICD-10-CM

## 2013-11-26 DIAGNOSIS — C50412 Malignant neoplasm of upper-outer quadrant of left female breast: Secondary | ICD-10-CM

## 2013-11-26 DIAGNOSIS — Z5112 Encounter for antineoplastic immunotherapy: Secondary | ICD-10-CM

## 2013-11-26 MED ORDER — DIPHENHYDRAMINE HCL 25 MG PO CAPS
50.0000 mg | ORAL_CAPSULE | Freq: Once | ORAL | Status: AC
  Administered 2013-11-26: 50 mg via ORAL

## 2013-11-26 MED ORDER — DEXAMETHASONE SODIUM PHOSPHATE 20 MG/5ML IJ SOLN
INTRAMUSCULAR | Status: AC
  Filled 2013-11-26: qty 5

## 2013-11-26 MED ORDER — PALONOSETRON HCL INJECTION 0.25 MG/5ML
0.2500 mg | Freq: Once | INTRAVENOUS | Status: AC
  Administered 2013-11-26: 0.25 mg via INTRAVENOUS

## 2013-11-26 MED ORDER — SODIUM CHLORIDE 0.9 % IV SOLN
150.0000 mg | Freq: Once | INTRAVENOUS | Status: AC
  Administered 2013-11-26: 150 mg via INTRAVENOUS
  Filled 2013-11-26: qty 5

## 2013-11-26 MED ORDER — HEPARIN SOD (PORK) LOCK FLUSH 100 UNIT/ML IV SOLN
500.0000 [IU] | Freq: Once | INTRAVENOUS | Status: AC | PRN
  Administered 2013-11-26: 500 [IU]
  Filled 2013-11-26: qty 5

## 2013-11-26 MED ORDER — ACETAMINOPHEN 325 MG PO TABS
650.0000 mg | ORAL_TABLET | Freq: Once | ORAL | Status: AC
  Administered 2013-11-26: 650 mg via ORAL

## 2013-11-26 MED ORDER — DEXAMETHASONE 4 MG PO TABS: 4.0000 mg | ORAL_TABLET | Freq: Two times a day (BID) | ORAL | Status: DC

## 2013-11-26 MED ORDER — ACETAMINOPHEN 325 MG PO TABS
ORAL_TABLET | ORAL | Status: AC
  Filled 2013-11-26: qty 2

## 2013-11-26 MED ORDER — LORAZEPAM 0.5 MG PO TABS: 0.5000 mg | ORAL_TABLET | Freq: Four times a day (QID) | ORAL | Status: DC | PRN

## 2013-11-26 MED ORDER — SODIUM CHLORIDE 0.9 % IV SOLN
580.0000 mg | Freq: Once | INTRAVENOUS | Status: AC
  Administered 2013-11-26: 580 mg via INTRAVENOUS
  Filled 2013-11-26: qty 58

## 2013-11-26 MED ORDER — SODIUM CHLORIDE 0.9 % IV SOLN
840.0000 mg | Freq: Once | INTRAVENOUS | Status: AC
  Administered 2013-11-26: 840 mg via INTRAVENOUS
  Filled 2013-11-26: qty 28

## 2013-11-26 MED ORDER — DEXAMETHASONE SODIUM PHOSPHATE 20 MG/5ML IJ SOLN
12.0000 mg | Freq: Once | INTRAMUSCULAR | Status: AC
  Administered 2013-11-26: 12 mg via INTRAVENOUS

## 2013-11-26 MED ORDER — DIPHENHYDRAMINE HCL 25 MG PO CAPS
ORAL_CAPSULE | ORAL | Status: AC
  Filled 2013-11-26: qty 2

## 2013-11-26 MED ORDER — SODIUM CHLORIDE 0.9 % IV SOLN
Freq: Once | INTRAVENOUS | Status: AC
  Administered 2013-11-26: 09:00:00 via INTRAVENOUS

## 2013-11-26 MED ORDER — DEXAMETHASONE SODIUM PHOSPHATE 20 MG/5ML IJ SOLN: 20.0000 mg | Freq: Once | INTRAMUSCULAR | Status: DC

## 2013-11-26 MED ORDER — PALONOSETRON HCL INJECTION 0.25 MG/5ML
INTRAVENOUS | Status: AC
  Filled 2013-11-26: qty 5

## 2013-11-26 MED ORDER — PROCHLORPERAZINE MALEATE 10 MG PO TABS: 10.0000 mg | ORAL_TABLET | Freq: Four times a day (QID) | ORAL | Status: DC | PRN

## 2013-11-26 MED ORDER — DIPHENHYDRAMINE HCL 25 MG PO CAPS
ORAL_CAPSULE | ORAL | Status: AC
  Filled 2013-11-26: qty 1

## 2013-11-26 MED ORDER — SODIUM CHLORIDE 0.9 % IV SOLN
75.0000 mg/m2 | Freq: Once | INTRAVENOUS | Status: AC
  Administered 2013-11-26: 140 mg via INTRAVENOUS
  Filled 2013-11-26: qty 14

## 2013-11-26 MED ORDER — SODIUM CHLORIDE 0.9 % IJ SOLN
10.0000 mL | INTRAMUSCULAR | Status: DC | PRN
  Administered 2013-11-26: 10 mL
  Filled 2013-11-26: qty 10

## 2013-11-26 MED ORDER — SODIUM CHLORIDE 0.9 % IV SOLN
8.0000 mg/kg | Freq: Once | INTRAVENOUS | Status: AC
  Administered 2013-11-26: 609 mg via INTRAVENOUS
  Filled 2013-11-26: qty 29

## 2013-11-26 NOTE — Patient Instructions (Signed)
[facility] Discharge Instructions for Patients Receiving Chemotherapy  Today you received the following chemotherapy agents :  Herceptin, Perjeta, Taxotere, Carboplatin.  To help prevent nausea and vomiting after your treatment, we encourage you to take your nausea medication as prescribed by your physician.  Take Dexamethasone 4mg   Twice daily for 3 days after chemo;  Take Dexamethasone  With  Food;   Take Compazine 10mg  by mouth every 6 hours as needed for nausea;  Can also take Phenergan 25mg  by mouth every 6 hours as needed for nausea if nausea symptom not relieved by Compazine. DO NOT Eat  Greasy  Nor  Spicy  Foods.   DO Drink  Lots  Of  Fluids as tolerated.   If you develop nausea and vomiting that is not controlled by your nausea medication, call the clinic.   BELOW ARE SYMPTOMS THAT SHOULD BE REPORTED IMMEDIATELY:  *FEVER GREATER THAN 100.5 F  *CHILLS WITH OR WITHOUT FEVER  NAUSEA AND VOMITING THAT IS NOT CONTROLLED WITH YOUR NAUSEA MEDICATION  *UNUSUAL SHORTNESS OF BREATH  *UNUSUAL BRUISING OR BLEEDING  TENDERNESS IN MOUTH AND THROAT WITH OR WITHOUT PRESENCE OF ULCERS  *URINARY PROBLEMS  *BOWEL PROBLEMS  UNUSUAL RASH Items with * indicate a potential emergency and should be followed up as soon as possible.  Feel free to call the clinic you have any questions or concerns. The clinic phone number is [phone].

## 2013-11-27 ENCOUNTER — Ambulatory Visit (HOSPITAL_BASED_OUTPATIENT_CLINIC_OR_DEPARTMENT_OTHER): Payer: Managed Care, Other (non HMO)

## 2013-11-27 ENCOUNTER — Telehealth: Payer: Self-pay | Admitting: *Deleted

## 2013-11-27 VITALS — BP 123/74 | HR 71 | Temp 98.2°F

## 2013-11-27 DIAGNOSIS — C50419 Malignant neoplasm of upper-outer quadrant of unspecified female breast: Secondary | ICD-10-CM

## 2013-11-27 DIAGNOSIS — Z5189 Encounter for other specified aftercare: Secondary | ICD-10-CM

## 2013-11-27 DIAGNOSIS — C50412 Malignant neoplasm of upper-outer quadrant of left female breast: Secondary | ICD-10-CM

## 2013-11-27 MED ORDER — PEGFILGRASTIM INJECTION 6 MG/0.6ML
6.0000 mg | Freq: Once | SUBCUTANEOUS | Status: AC
  Administered 2013-11-27: 6 mg via SUBCUTANEOUS
  Filled 2013-11-27: qty 0.6

## 2013-11-27 NOTE — Telephone Encounter (Signed)
Rachel [name] here for Neulasta injection following 1st tch/perjeta chemo treatment.  States that she is doing good  No nausea, vomiting, or diarrhea.  Is drinking her fluids and eating light.  All questions answered.  Knows to call if she has any problems or concerns.

## 2013-11-27 NOTE — Patient Instructions (Signed)
Pegfilgrastim injection What is this medicine? PEGFILGRASTIM (peg fil GRA stim) is a long-acting granulocyte colony-stimulating factor that stimulates the growth of neutrophils, a type of white blood cell important in the body's fight against infection. It is used to reduce the incidence of fever and infection in patients with certain types of cancer who are receiving chemotherapy that affects the bone marrow. This medicine may be used for other purposes; ask your health care provider or pharmacist if you have questions. COMMON BRAND NAME(S): Neulasta What should I tell my health care provider before I take this medicine? They need to know if you have any of these conditions: -latex allergy -ongoing radiation therapy -sickle cell disease -skin reactions to acrylic adhesives (On-Body Injector only) -an unusual or allergic reaction to pegfilgrastim, filgrastim, other medicines, foods, dyes, or preservatives -pregnant or trying to get pregnant -breast-feeding How should I use this medicine? This medicine is for injection under the skin. If you get this medicine at home, you will be taught how to prepare and give the pre-filled syringe or how to use the On-body Injector. Refer to the patient Instructions for Use for detailed instructions. Use exactly as directed. Take your medicine at regular intervals. Do not take your medicine more often than directed. It is important that you put your used needles and syringes in a special sharps container. Do not put them in a trash can. If you do not have a sharps container, call your pharmacist or healthcare provider to get one. Talk to your pediatrician regarding the use of this medicine in children. Special care may be needed. Overdosage: If you think you have taken too much of this medicine contact a poison control center or emergency room at once. NOTE: This medicine is only for you. Do not share this medicine with others. What if I miss a dose? It is  important not to miss your dose. Call your doctor or health care professional if you miss your dose. If you miss a dose due to an On-body Injector failure or leakage, a new dose should be administered as soon as possible using a single prefilled syringe for manual use. What may interact with this medicine? Interactions have not been studied. Give your health care provider a list of all the medicines, herbs, non-prescription drugs, or dietary supplements you use. Also tell them if you smoke, drink alcohol, or use illegal drugs. Some items may interact with your medicine. This list may not describe all possible interactions. Give your health care provider a list of all the medicines, herbs, non-prescription drugs, or dietary supplements you use. Also tell them if you smoke, drink alcohol, or use illegal drugs. Some items may interact with your medicine. What should I watch for while using this medicine? You may need blood work done while you are taking this medicine. If you are going to need a MRI, CT scan, or other procedure, tell your doctor that you are using this medicine (On-Body Injector only). What side effects may I notice from receiving this medicine? Side effects that you should report to your doctor or health care professional as soon as possible: -allergic reactions like skin rash, itching or hives, swelling of the face, lips, or tongue -dizziness -fever -pain, redness, or irritation at site where injected -pinpoint red spots on the skin -shortness of breath or breathing problems -stomach or side pain, or pain at the shoulder -swelling -tiredness -trouble passing urine Side effects that usually do not require medical attention (report to your doctor   or health care professional if they continue or are bothersome): -bone pain -muscle pain This list may not describe all possible side effects. Call your doctor for medical advice about side effects. You may report side effects to FDA at  1-800-FDA-1088. Where should I keep my medicine? Keep out of the reach of children. Store pre-filled syringes in a refrigerator between 2 and 8 degrees C (36 and 46 degrees F). Do not freeze. Keep in carton to protect from light. Throw away this medicine if it is left out of the refrigerator for more than 48 hours. Throw away any unused medicine after the expiration date. NOTE: This sheet is a summary. It may not cover all possible information. If you have questions about this medicine, talk to your doctor, pharmacist, or health care provider.  2015, Elsevier/Gold Standard. (2013-06-21 16:14:05)  

## 2013-11-30 ENCOUNTER — Other Ambulatory Visit: Payer: Self-pay | Admitting: *Deleted

## 2013-11-30 DIAGNOSIS — C50412 Malignant neoplasm of upper-outer quadrant of left female breast: Secondary | ICD-10-CM

## 2013-12-03 ENCOUNTER — Telehealth: Payer: Self-pay | Admitting: Hematology

## 2013-12-03 ENCOUNTER — Ambulatory Visit (HOSPITAL_BASED_OUTPATIENT_CLINIC_OR_DEPARTMENT_OTHER): Payer: Managed Care, Other (non HMO) | Admitting: Hematology

## 2013-12-03 ENCOUNTER — Other Ambulatory Visit (HOSPITAL_BASED_OUTPATIENT_CLINIC_OR_DEPARTMENT_OTHER): Payer: Managed Care, Other (non HMO)

## 2013-12-03 VITALS — BP 122/73 | HR 95 | Temp 98.2°F | Resp 20 | Ht 64.0 in | Wt 166.7 lb

## 2013-12-03 DIAGNOSIS — D72829 Elevated white blood cell count, unspecified: Secondary | ICD-10-CM

## 2013-12-03 DIAGNOSIS — C50412 Malignant neoplasm of upper-outer quadrant of left female breast: Secondary | ICD-10-CM

## 2013-12-03 DIAGNOSIS — C50419 Malignant neoplasm of upper-outer quadrant of unspecified female breast: Secondary | ICD-10-CM

## 2013-12-03 DIAGNOSIS — C50919 Malignant neoplasm of unspecified site of unspecified female breast: Secondary | ICD-10-CM

## 2013-12-03 DIAGNOSIS — Z170 Estrogen receptor positive status [ER+]: Secondary | ICD-10-CM

## 2013-12-03 LAB — CBC WITH DIFFERENTIAL/PLATELET
BASO%: 0.3 % (ref 0.0–2.0)
BASOS ABS: 0.1 10*3/uL (ref 0.0–0.1)
EOS%: 0 % (ref 0.0–7.0)
Eosinophils Absolute: 0 10*3/uL (ref 0.0–0.5)
HCT: 37.6 % (ref 34.8–46.6)
HEMOGLOBIN: 12 g/dL (ref 11.6–15.9)
LYMPH%: 9.9 % — AB (ref 14.0–49.7)
MCH: 28.3 pg (ref 25.1–34.0)
MCHC: 32 g/dL (ref 31.5–36.0)
MCV: 88.6 fL (ref 79.5–101.0)
MONO#: 1.3 10*3/uL — ABNORMAL HIGH (ref 0.1–0.9)
MONO%: 5.3 % (ref 0.0–14.0)
NEUT#: 21.4 10*3/uL — ABNORMAL HIGH (ref 1.5–6.5)
NEUT%: 84.5 % — AB (ref 38.4–76.8)
PLATELETS: 159 10*3/uL (ref 145–400)
RBC: 4.24 10*6/uL (ref 3.70–5.45)
RDW: 14.3 % (ref 11.2–14.5)
WBC: 25.3 10*3/uL — ABNORMAL HIGH (ref 3.9–10.3)
lymph#: 2.5 10*3/uL (ref 0.9–3.3)

## 2013-12-03 LAB — COMPREHENSIVE METABOLIC PANEL (CC13)
ALK PHOS: 69 U/L (ref 40–150)
ALT: 28 U/L (ref 0–55)
AST: 31 U/L (ref 5–34)
Albumin: 3.5 g/dL (ref 3.5–5.0)
Anion Gap: 9 mEq/L (ref 3–11)
BUN: 13.3 mg/dL (ref 7.0–26.0)
CO2: 30 mEq/L — ABNORMAL HIGH (ref 22–29)
CREATININE: 0.9 mg/dL (ref 0.6–1.1)
Calcium: 9.3 mg/dL (ref 8.4–10.4)
Chloride: 103 mEq/L (ref 98–109)
Glucose: 110 mg/dl (ref 70–140)
POTASSIUM: 4.5 meq/L (ref 3.5–5.1)
Sodium: 142 mEq/L (ref 136–145)
Total Bilirubin: 0.21 mg/dL (ref 0.20–1.20)
Total Protein: 6.8 g/dL (ref 6.4–8.3)

## 2013-12-03 NOTE — Progress Notes (Signed)
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  Breast Cancer History:   Rachel [name] 58 y.o. pleasant female who lives in [place], [place] was evaluated on 11/07/2013 with a new diagnosis of a HER-2+ breast cancer. She was seen at [facility] today along with Dr Barry Dienes from Surgery and Dr Valere Dross in Radiation Oncology in the multidisciplinary clinic.   She had been getting annual mammograms since she turned 39. She never had a previous breast problem or biopsy but is known to have dense breasts as per her previous mammograms. Last year mammogram 10/16/2012 was fine. This year she went for her screening mammogram but did not feel a lump. On 10/17/2013 to the Breast center of [facility] and was found to have distortion in left breast and spot compression views and ultrasound was recommended.LEFT CC AND MLO VIEWS showed abnormality in left breast. Spot Compression views showed persistent abnormality in left breast.US breast showed a hypoechoic irregular mass with surrounding distortion at 2'0 clock position located 4 cm from nipple measuring 1.1 x 0.9 x 0.7 cm. No left axillary lymphadenopathy noted. [place] was corresponding to mammographic results.  Patient underwent an US guided biopsy with Clip placement on 10/30/2013. Pathology from that (Accession no [id number]) showed an Invasive ductal carcinoma, DCIS and calcifications. Estrogen receptor (ER) 100%+, Progesterone receptor (PR) 42%+ and proliferative marker KI-67 index 53%+. Her-2 receptor status by FISH is positive. So it is a triple positive breast cancer, also known as "LUMINAL B molecular subtype". The grade of tumor invasive and DCIS was reported as grade II. Luminal  B breast tumors are usually large in size, higher grade, high mitotic index, high nodal involvement and up to 30% have p53 mutation. Their survival is also very good though little less than Luminal A subtype. Overall I will call it a favorable biology of tumor.  Interestingly the MRI breast performed on 11/05/2013 showed an enhancing mass which is much larger than the mass seen mammographically or sonographically.  FINDINGS: Breast composition: b. Scattered fibroglandular tissue. Background parenchymal enhancement: Moderate Right breast: No mass or abnormal enhancement. Left breast: In the anterior and middle thirds of the upper and upper outer quadrant of the left breast is a heterogeneous enhancing irregular mass measuring 5 x 2.5 x 5.1 cm in transverse, anterior-posterior and craniocaudal dimensions. Signal void artifact is seen along the lateral aspect of enhancement consistent with the biopsy clip. Along the anterior aspect of the irregular enhancing mass is a more focal 1.5 cm enhancement. Lymph nodes: There is a 9 mm level 1 left axillary lymph node that does not maintain a normal fatty hilum. It is concerning for metastatic involvement. Ancillary findings: None.  IMPRESSION: Irregular enhancing mass measuring 5.1 cm in the upper and upper-outer quadrants of the left breast. T Suspicious level 1 left axillary lymph node concerning for metastatic involving.           It was also recommended to get a biopsy of the anterior aspect of abnormal enhancement in the left breast is recommended to prove the extent of disease. That was done on 11/09/2013 with MR guidance. Using sterile technique, 2% Lidocaine, MRI guidance, and a 9 gauge vacuum assisted device, biopsy was performed of the  suspicious area of enhancement in the subareolar left breast using a lateral approach. At the conclusion of the procedure, a dumbbell tissue marker clip was deployed into the biopsy cavity.   Pathology from that Accession  no [id number] showed Ductal cancer and DCIS. ER 100%, PR 67%, HER 2 by CISH showed amplification RESULT RATIO OF HER2: CEP 17 SIGNALS 2.80, AVERAGE HER2 COPY NUMBER PER CELL 3.50. So it is showing the same phenotype that is Triple positive Breast cancer or LUMINAL B subtype.  A second-look Ultrasound was performed on 11/12/2013, showing normal fibroglandular and fibrofatty tissue. No enlarged or abnormal appearing lymph nodes are seen. No normal lymph nodes are not discretely visualized.IMPRESSION: Negative exam. Lymph node noted on the current breast MRI is not visualized sonographically.  Clinically and based on radiographic data so far it looks like a stage III-A breast cancer (size max 5.1 cm T3, possible+/- nodal involvement N1). Patient started her chemotherapy with TCH-P regimen and got 1st cycle on 11/26/2013. This was folllowed by Neulasta shot given on 11/27/2013.  INTERVAL HISTORY:   Patient tolerated chemotherapy well. No nausea or vomiting. Mild diarrhea alternating with constipation noted. She did get some backache with use of neulasta but it is getting better. No mouth sores. She did use claritin to minimize neulasta related pain and arthralgias. She denies any cardiac symptoms. Denies any fatigue or neuropathy. No skin rash. The breast lump on her left side on exam is smaller within a week's time. Rest of the ROS is negative.   MEDICAL HISTORY:  Past Medical History  Diagnosis Date  . Breast cancer   . Diabetes mellitus without complication   . Hypertension   . Alopecia areata   . Endometriosis   . Hypercholesteremia     SURGICAL HISTORY: Past Surgical History  Procedure Laterality Date  . Abdominal hysterectomy      BSO  . Colonoscopy    . Portacath placement N/A 11/20/2013    Procedure: INSERTION PORT-A-CATH;  Surgeon: [name], MD;  Location: [facility];  Service: General;  Laterality: N/A;   Partial hysterectomy 1995 and total hysterectomy with oophorectomy 1996 for  endometriosis.   GYNECOLOGICAL HISTORY:  She menarched at early age of 79 and went to menopause at age 56 when she had a partial hysterectomy (1995) and one ovary removed (for endometriosis), following year she total hysterectomy and other ovary removed as well (1996). She had 1 pregnancy, her first child was born at age 24.  Breast feeding history I did not ask.  She received birth control pills and shots off and on for 20 years.  She was never exposed to fertility medications but did take hormone replacement therapy (Premarin) for about 5 years ending in 2001. She has NO family history of Breast/GYN/GI cancer. Last screening colonoscopy was 2007 and she had a bone density done 06/13/2008 at [facility] showing a normal BMD by WHO criteria. Last pap done 1997.  SOCIAL HISTORY: History   Social History  . Marital Status: Married    Spouse Name: Willaim Rayas. Foust    Number of Children: N/A  . Years of Education: N/A   Occupational History  . House wife    Social History Main Topics  . Smoking status: Never Smoker   . Smokeless tobacco: Never Used  . Alcohol Use: No  . Drug Use: No  . Sexual Activity: Yes    Birth Control/ Protection: Post-menopausal   Other Topics Concern  . Not on file  Social History Narrative   Patient lives in [place], [place]. She is a housewife. Both her parents are deceased father at age 29 and mother at age 53. Patient have 2 brothers and 2 sisters. She has a daughter [place] who lives in [place], [place]. Patient has good family support. She does not smoke or consume alcohol and no use of recreational drugs.      FAMILY HISTORY: Family History  Problem Relation Age of Onset  . Diabetes Mother   . Coronary artery disease Mother   . Diabetes Father   . Diabetes Sister   . Diabetes Brother   . Diabetes Brother    No history of cancer and her breast cancer does not appear to be hereditary type rather sporadic type.  ALLERGIES:   is allergic to eggs or egg-derived products; sulfa antibiotics; tetanus toxoids; multihance; and penicillins. They asked me about the flu shot but I will not MEDICATIONS:  Current Outpatient Prescriptions  Medication Sig Dispense Refill  . calcium citrate-vitamin D (CITRACAL+D) 315-200 MG-UNIT per tablet Take 1 tablet by mouth daily.      . hydrochlorothiazide (HYDRODIURIL) 25 MG tablet Take 25 mg by mouth daily.      Marland Kitchen HYDROcodone-acetaminophen (NORCO) 5-325 MG per tablet Take 1-2 tablets by mouth every 4 (four) hours as needed for moderate pain or severe pain.  30 tablet  0  . lidocaine-prilocaine (EMLA) cream Apply one application to port-a-cath 1 - 2 hours prior to access.  30 g  1  . LORazepam (ATIVAN) 0.5 MG tablet Take 1 tablet (0.5 mg total) by mouth every 6 (six) hours as needed (Nausea or vomiting).  30 tablet  0  . Multiple Vitamin (MULTIVITAMIN) tablet Take 1 tablet by mouth daily.      . promethazine (PHENERGAN) 25 MG tablet Take 25 mg by mouth every 6 (six) hours as needed for nausea or vomiting.      Marland Kitchen dexamethasone (DECADRON) 4 MG tablet Take 1 tablet (4 mg total) by mouth 2 (two) times daily. Start the day before Taxotere. Then again the day after chemo for 3 days.  30 tablet  1  . prochlorperazine (COMPAZINE) 10 MG tablet Take 1 tablet (10 mg total) by mouth every 6 (six) hours as needed (Nausea or vomiting).  30 tablet  1   No current facility-administered medications for this visit.    REVIEW OF SYSTEMS:   Constitutional: Denies fevers, chills or abnormal night sweats Eyes: Denies blurriness of vision, double vision or watery eyes Ears, nose, mouth, throat, and face: Denies mucositis or sore throat Respiratory: Denies cough, dyspnea or wheezes Cardiovascular: Denies palpitation, chest discomfort or lower extremity swelling Gastrointestinal:  Denies nausea, heartburn but did notice change in bowel habits some constipation for which she took prune juice and stool softner and  also 2-3 episodes of diarrhea. Skin: Denies abnormal skin rashes Lymphatics: Denies new lymphadenopathy or easy bruising Neurological:Denies numbness, tingling or new weaknesses Behavioral/Psych: Mood is stable, no new changes  All other systems were reviewed with the patient and are negative.  PHYSICAL EXAMINATION:  ECOG PERFORMANCE STATUS: 1, KPS 100  Filed Vitals:   12/03/13 1334  BP: 122/73  Pulse: 95  Temp: 98.2 F (36.8 C)  Resp: 20   Filed Weights   12/03/13 1334  Weight: 166 lb 11.2 oz (75.615 kg)    GENERAL:alert, no distress and comfortable SKIN: skin color, texture, turgor are normal, no rashes or significant lesions EYES: normal, conjunctiva are  pink and non-injected, sclera clear OROPHARYNX:no exudate, no erythema and lips, buccal mucosa, and tongue normal  NECK: supple, thyroid normal size, non-tender, without nodularity LYMPH:  no palpable lymphadenopathy in the cervical, axillary or inguinal LUNGS: clear to auscultation and percussion with normal breathing effort HEART: regular rate & rhythm and no murmurs and no lower extremity edema ABDOMEN:abdomen soft, non-tender and normal bowel sounds Musculoskeletal:no cyanosis of digits and no clubbing  PSYCH: alert & oriented x 3 with fluent speech NEURO: no focal motor/sensory deficits BREAST EXAM: Right breast fibrocystic feeling, no discrete or palpable mass noted, no skin or nipple changes, no adenopathy. Left breast biopsy site is swollen and tender, Underlying mass can be felt about 2 cm on exam, no nodes felt in axilla. No supraclavicular or cervical LN felt on either side.   LABORATORY DATA:         On 11/15/2013  CA 27-29 was 23, CA 15-3 16, Vit D level 46   RADIOGRAPHIC STUDIES:  I have personally reviewed the radiological images as listed above and agreed with the findings in the report.These included Mammograms, Ultrasound, MRI breast and report on Bone density test.  PATHOLOGY  DATA:  Reviewed.            ASSESSMENT:   1. This is a very pleasant 58 years-old Afro-american female from [facility] who presents with a left breast mass. This was picked on a mammogram and maximal diameter was 1.7 cm on Ultrasound however the MRI breast showed this mass to be large almost 5.1 cm in maximal dimension making it a clinical T3 tumor. Also it showed a suspicious looking solitary lymph node in axilla and that can make it a Clinical Stage III-A tumor. The tumor biology indicates a LUMINAL-B type which means ER/PR/HER2 + tumor. Tumor is strongly ER+100%, PR 42%,KI-67 Index 53% showing a brisk mitotic rate and grade is called grade 2 (but grading as per pathologist cannot be relied upon on initial biopsy). She underwent a repeat biopsy from breast on 11/09/2013 anteriorly and that confirmed the same triple positive breast cancer or Luminal B type. he second look [place] did not show a LN.  2. she can benefit from neoadjuvant chemotherapy esp since her tumor is her-2 positive and neoadjuvant chemotherapy has the potential of downsizing her tumor and making her a lumpectomy candidate. Also we can get an idea about the clinical behavior of the tumor in terms of the pathologic response caused by chemotherapy. Lot of these her2 + tumors respond dramatically and can lead to complete pathological response which then gives her a very good chance for remission and perhaps a cure.  3. The chemo-regimen I started is a NCCN-endorsed preferred regimen which has very good activity but avoids the cardiotoxicity and leukemogenesis risk associated with using anthracyclines. We use Taxotere and Carboplatin as the backbone chemotherapy and use Trastuzumab (Herceptin) and pertuzumab (Perjeta) as the her-2 directed agents. So all 4 agents are used what we call as TCH-P regimen.  4. The data for this comes from [facility] trial which is a phase 2 neoadjuvant study looking at chemotherapies with and without  anthracyclines. The design of this trial was     5. As noted above the complete PCR rate with using TCH-P was 63.6% which is very impressive and incidence of left ventricular dysfunction was only 2.6%.  6.  The most common grade 3 adverse events for pertuzumab plus doctaxel and herceptin are neutropenia 45%, febrile neutropenia 8.4% and diarrhea about 6%.  Other adverse efefcts which can be seen are decreased cardiac function, infusion related reactions, hypersensitivity reactions and anaphylaxis. In [facility] study which led to NCCN endorsement all grade diarrhea was 66% and something we will need to monitor and treat aggressively.   7. Patient tolerated her 1st cycle of chemotherapy well and was supported by neulasta. We are planning 6 cycles of chemotherapy and her-2 directed therapies to be followed by Breast MRI and surgical evaluation.  8. Her baseline echocardiogram done on 11/15/2013 shows an EF of 50-55%  9. Leukocytosis from use of neulasta.  PLAN:   1. Cycle 2 of TCH-P will be given on 12/17/2013 and I will maintain the doses.  2. She will get neulasta post-chemotherapy.  3. Her breast exam does show some shrinkage of lump which is a good sign.  4. Continue mouth care and universal precautions.   5. RTC on 9/14 for chemotherapy and MD visit.   Bernadene Bell, MD Medical Hematologist/Oncologist [place] Pager: [phone] Office No: [phone]

## 2013-12-03 NOTE — Telephone Encounter (Signed)
[facility] PT APPT SCHEDULE FOR SEPT/OCT.  °

## 2013-12-17 ENCOUNTER — Other Ambulatory Visit (HOSPITAL_BASED_OUTPATIENT_CLINIC_OR_DEPARTMENT_OTHER): Payer: Managed Care, Other (non HMO)

## 2013-12-17 ENCOUNTER — Telehealth: Payer: Self-pay | Admitting: Hematology

## 2013-12-17 ENCOUNTER — Ambulatory Visit (HOSPITAL_BASED_OUTPATIENT_CLINIC_OR_DEPARTMENT_OTHER): Payer: Managed Care, Other (non HMO)

## 2013-12-17 ENCOUNTER — Ambulatory Visit (HOSPITAL_BASED_OUTPATIENT_CLINIC_OR_DEPARTMENT_OTHER): Payer: Managed Care, Other (non HMO) | Admitting: Hematology

## 2013-12-17 VITALS — BP 136/72 | HR 80 | Temp 98.6°F | Resp 18 | Ht 64.0 in | Wt 171.2 lb

## 2013-12-17 DIAGNOSIS — C50419 Malignant neoplasm of upper-outer quadrant of unspecified female breast: Secondary | ICD-10-CM

## 2013-12-17 DIAGNOSIS — C50412 Malignant neoplasm of upper-outer quadrant of left female breast: Secondary | ICD-10-CM

## 2013-12-17 DIAGNOSIS — Z170 Estrogen receptor positive status [ER+]: Secondary | ICD-10-CM

## 2013-12-17 DIAGNOSIS — Z5111 Encounter for antineoplastic chemotherapy: Secondary | ICD-10-CM

## 2013-12-17 DIAGNOSIS — C50919 Malignant neoplasm of unspecified site of unspecified female breast: Secondary | ICD-10-CM

## 2013-12-17 DIAGNOSIS — D72829 Elevated white blood cell count, unspecified: Secondary | ICD-10-CM

## 2013-12-17 DIAGNOSIS — Z5112 Encounter for antineoplastic immunotherapy: Secondary | ICD-10-CM

## 2013-12-17 LAB — CBC WITH DIFFERENTIAL/PLATELET
BASO%: 0.1 % (ref 0.0–2.0)
Basophils Absolute: 0 10*3/uL (ref 0.0–0.1)
EOS%: 0.1 % (ref 0.0–7.0)
Eosinophils Absolute: 0 10*3/uL (ref 0.0–0.5)
HCT: 33.8 % — ABNORMAL LOW (ref 34.8–46.6)
HGB: 11.1 g/dL — ABNORMAL LOW (ref 11.6–15.9)
LYMPH#: 2.1 10*3/uL (ref 0.9–3.3)
LYMPH%: 17 % (ref 14.0–49.7)
MCH: 29.1 pg (ref 25.1–34.0)
MCHC: 32.8 g/dL (ref 31.5–36.0)
MCV: 88.5 fL (ref 79.5–101.0)
MONO#: 0.9 10*3/uL (ref 0.1–0.9)
MONO%: 7.4 % (ref 0.0–14.0)
NEUT#: 9.2 10*3/uL — ABNORMAL HIGH (ref 1.5–6.5)
NEUT%: 75.4 % (ref 38.4–76.8)
PLATELETS: 245 10*3/uL (ref 145–400)
RBC: 3.82 10*6/uL (ref 3.70–5.45)
RDW: 15.7 % — ABNORMAL HIGH (ref 11.2–14.5)
WBC: 12.2 10*3/uL — ABNORMAL HIGH (ref 3.9–10.3)

## 2013-12-17 LAB — COMPREHENSIVE METABOLIC PANEL (CC13)
ALT: 19 U/L (ref 0–55)
AST: 16 U/L (ref 5–34)
Albumin: 3.5 g/dL (ref 3.5–5.0)
Alkaline Phosphatase: 62 U/L (ref 40–150)
Anion Gap: 9 mEq/L (ref 3–11)
BUN: 18.8 mg/dL (ref 7.0–26.0)
CO2: 28 mEq/L (ref 22–29)
CREATININE: 0.9 mg/dL (ref 0.6–1.1)
Calcium: 9.6 mg/dL (ref 8.4–10.4)
Chloride: 108 mEq/L (ref 98–109)
Glucose: 117 mg/dl (ref 70–140)
Potassium: 4.2 mEq/L (ref 3.5–5.1)
Sodium: 144 mEq/L (ref 136–145)
Total Bilirubin: 0.49 mg/dL (ref 0.20–1.20)
Total Protein: 6.9 g/dL (ref 6.4–8.3)

## 2013-12-17 MED ORDER — SODIUM CHLORIDE 0.9 % IJ SOLN
10.0000 mL | INTRAMUSCULAR | Status: DC | PRN
  Administered 2013-12-17: 10 mL
  Filled 2013-12-17: qty 10

## 2013-12-17 MED ORDER — SODIUM CHLORIDE 0.9 % IV SOLN
150.0000 mg | Freq: Once | INTRAVENOUS | Status: AC
  Administered 2013-12-17: 150 mg via INTRAVENOUS
  Filled 2013-12-17: qty 5

## 2013-12-17 MED ORDER — ACETAMINOPHEN 325 MG PO TABS
ORAL_TABLET | ORAL | Status: AC
  Filled 2013-12-17: qty 2

## 2013-12-17 MED ORDER — HEPARIN SOD (PORK) LOCK FLUSH 100 UNIT/ML IV SOLN
500.0000 [IU] | Freq: Once | INTRAVENOUS | Status: AC | PRN
  Administered 2013-12-17: 500 [IU]
  Filled 2013-12-17: qty 5

## 2013-12-17 MED ORDER — DIPHENHYDRAMINE HCL 25 MG PO CAPS
ORAL_CAPSULE | ORAL | Status: AC
  Filled 2013-12-17: qty 2

## 2013-12-17 MED ORDER — TRASTUZUMAB CHEMO INJECTION 440 MG
6.0000 mg/kg | Freq: Once | INTRAVENOUS | Status: AC
  Administered 2013-12-17: 462 mg via INTRAVENOUS
  Filled 2013-12-17: qty 22

## 2013-12-17 MED ORDER — PALONOSETRON HCL INJECTION 0.25 MG/5ML
INTRAVENOUS | Status: AC
  Filled 2013-12-17: qty 5

## 2013-12-17 MED ORDER — PALONOSETRON HCL INJECTION 0.25 MG/5ML
0.2500 mg | Freq: Once | INTRAVENOUS | Status: AC
  Administered 2013-12-17: 0.25 mg via INTRAVENOUS

## 2013-12-17 MED ORDER — PERTUZUMAB CHEMO INJECTION 420 MG/14ML
420.0000 mg | Freq: Once | INTRAVENOUS | Status: AC
  Administered 2013-12-17: 420 mg via INTRAVENOUS
  Filled 2013-12-17: qty 14

## 2013-12-17 MED ORDER — ACETAMINOPHEN 325 MG PO TABS
650.0000 mg | ORAL_TABLET | Freq: Once | ORAL | Status: AC
  Administered 2013-12-17: 650 mg via ORAL

## 2013-12-17 MED ORDER — DOCETAXEL CHEMO INJECTION 160 MG/16ML
75.0000 mg/m2 | Freq: Once | INTRAVENOUS | Status: AC
  Administered 2013-12-17: 140 mg via INTRAVENOUS
  Filled 2013-12-17: qty 14

## 2013-12-17 MED ORDER — DEXAMETHASONE SODIUM PHOSPHATE 20 MG/5ML IJ SOLN
INTRAMUSCULAR | Status: AC
  Filled 2013-12-17: qty 5

## 2013-12-17 MED ORDER — DIPHENHYDRAMINE HCL 25 MG PO CAPS
50.0000 mg | ORAL_CAPSULE | Freq: Once | ORAL | Status: AC
  Administered 2013-12-17: 50 mg via ORAL

## 2013-12-17 MED ORDER — SODIUM CHLORIDE 0.9 % IV SOLN
580.0000 mg | Freq: Once | INTRAVENOUS | Status: AC
  Administered 2013-12-17: 580 mg via INTRAVENOUS
  Filled 2013-12-17: qty 58

## 2013-12-17 MED ORDER — DEXAMETHASONE SODIUM PHOSPHATE 20 MG/5ML IJ SOLN
12.0000 mg | Freq: Once | INTRAMUSCULAR | Status: AC
  Administered 2013-12-17: 12 mg via INTRAVENOUS

## 2013-12-17 MED ORDER — SODIUM CHLORIDE 0.9 % IV SOLN
Freq: Once | INTRAVENOUS | Status: AC
  Administered 2013-12-17: 13:00:00 via INTRAVENOUS

## 2013-12-17 NOTE — Patient Instructions (Signed)
Rachel [name] Discharge Instructions for Patients Receiving Chemotherapy  Today you received the following chemotherapy agent: Herceptin, Perjeta, Taxotere, Carboplatin   To help prevent nausea and vomiting after your treatment, we encourage you to take your nausea medication as prescribed.    If you develop nausea and vomiting that is not controlled by your nausea medication, call the clinic.   BELOW ARE SYMPTOMS THAT SHOULD BE REPORTED IMMEDIATELY:  *FEVER GREATER THAN 100.5 F  *CHILLS WITH OR WITHOUT FEVER  NAUSEA AND VOMITING THAT IS NOT CONTROLLED WITH YOUR NAUSEA MEDICATION  *UNUSUAL SHORTNESS OF BREATH  *UNUSUAL BRUISING OR BLEEDING  TENDERNESS IN MOUTH AND THROAT WITH OR WITHOUT PRESENCE OF ULCERS  *URINARY PROBLEMS  *BOWEL PROBLEMS  UNUSUAL RASH Items with * indicate a potential emergency and should be followed up as soon as possible.  Feel free to call the clinic you have any questions or concerns. The clinic phone number is [phone].

## 2013-12-17 NOTE — Progress Notes (Signed)
[place] ONCOLOGY FOLLOW UP NOTE:  Patient Care Team: [name], MD as PCP - General (Internal Medicine) [name], MD as Consulting Physician (General Surgery)  CHIEF COMPLAINT:   1. 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 2. Patient starting Cycle # 2 today.  Breast Cancer History:   Rachel [name] 58 y.o. pleasant female who lives in [place], [place] was evaluated on 11/07/2013 with a diagnosis of a HER-2+ breast cancer. She was last seen here on 12/03/2013.   She had been getting annual mammograms since she turned 39. She never had a previous breast problem or biopsy but is known to have dense breasts as per her previous mammograms. Last year mammogram 10/16/2012 was fine. This year she went for her screening mammogram but did not feel a lump. On 10/17/2013 to the Breast center of [facility] and was found to have distortion in left breast and spot compression views and ultrasound was recommended.LEFT CC AND MLO VIEWS showed abnormality in left breast. Spot Compression views showed persistent abnormality in left breast.US breast showed a hypoechoic irregular mass with surrounding distortion at 2'0 clock position located 4 cm from nipple measuring 1.1 x 0.9 x 0.7 cm. No left axillary lymphadenopathy noted. [place] was corresponding to mammographic results.  Patient underwent an US guided biopsy with Clip placement on 10/30/2013. Pathology from that (Accession no [id number]) showed an Invasive ductal carcinoma, DCIS and calcifications. Estrogen receptor (ER) 100%+, Progesterone receptor (PR) 42%+ and proliferative marker KI-67 index 53%+. Her-2 receptor status by FISH is positive. So it is a triple positive breast cancer, also known as "LUMINAL B molecular subtype". The grade of tumor invasive and DCIS was reported as grade II. Luminal B breast tumors are usually large in size, higher grade, high mitotic index,  high nodal involvement and up to 30% have p53 mutation. Their survival is also very good though little less than Luminal A subtype. Overall I will call it a favorable biology of tumor.  Interestingly the MRI breast performed on 11/05/2013 showed an enhancing mass which is much larger than the mass seen mammographically or sonographically.  FINDINGS: Breast composition: b. Scattered fibroglandular tissue. Background parenchymal enhancement: Moderate Right breast: No mass or abnormal enhancement. Left breast: In the anterior and middle thirds of the upper and upper outer quadrant of the left breast is a heterogeneous enhancing irregular mass measuring 5 x 2.5 x 5.1 cm in transverse, anterior-posterior and craniocaudal dimensions. Signal void artifact is seen along the lateral aspect of enhancement consistent with the biopsy clip. Along the anterior aspect of the irregular enhancing mass is a more focal 1.5 cm enhancement. Lymph nodes: There is a 9 mm level 1 left axillary lymph node that does not maintain a normal fatty hilum. It is concerning for metastatic involvement. Ancillary findings: None.  IMPRESSION: Irregular enhancing mass measuring 5.1 cm in the upper and upper-outer quadrants of the left breast. T Suspicious level 1 left axillary lymph node concerning for metastatic involving.           It was also recommended to get a biopsy of the anterior aspect of abnormal enhancement in the left breast is recommended to prove the extent of disease. That was done on 11/09/2013 with MR guidance. Using sterile technique, 2% Lidocaine, MRI guidance, and a 9 gauge vacuum assisted device, biopsy was performed of the suspicious area of enhancement in the subareolar left breast using a  lateral approach. At the conclusion of the procedure, a dumbbell tissue marker clip was deployed into the biopsy cavity.   Pathology from that Accession no [id number] showed Ductal cancer and DCIS. ER 100%, PR 67%, HER 2 by CISH  showed amplification RESULT RATIO OF HER2: CEP 17 SIGNALS 2.80, AVERAGE HER2 COPY NUMBER PER CELL 3.50. So it is showing the same phenotype that is Triple positive Breast cancer or LUMINAL B subtype.  A second-look Ultrasound was performed on 11/12/2013, showing normal fibroglandular and fibrofatty tissue. No enlarged or abnormal appearing lymph nodes are seen. No normal lymph nodes are not discretely visualized.IMPRESSION: Negative exam. Lymph node noted on the current breast MRI is not visualized sonographically.  Clinically and based on radiographic data so far it looks like a stage III-A breast cancer (size max 5.1 cm T3, possible+/- nodal involvement N1). Patient started her chemotherapy with TCH-P regimen and got 1st cycle on 11/26/2013. This was folllowed by Neulasta shot given on 11/27/2013.  INTERVAL HISTORY:   Patient tolerated chemotherapy well. No nausea or vomiting. Mild diarrhea alternating with constipation noted. She did get some backache with use of neulasta but it is getting better. No mouth sores. She did use claritin to minimize neulasta related pain and arthralgias. She denies any cardiac symptoms. Denies any fatigue or neuropathy. No skin rash. The breast lump on her left side on exam is smaller and hardly palpable consistent with excellent clinical response. She gets cycle 2 of chemo today.    MEDICAL HISTORY:  Past Medical History  Diagnosis Date  . Breast cancer   . Diabetes mellitus without complication   . Hypertension   . Alopecia areata   . Endometriosis   . Hypercholesteremia     SURGICAL HISTORY: Past Surgical History  Procedure Laterality Date  . Abdominal hysterectomy      BSO  . Colonoscopy    . Portacath placement N/A 11/20/2013    Procedure: INSERTION PORT-A-CATH;  Surgeon: [name], MD;  Location: [facility];  Service: General;  Laterality: N/A;   Partial hysterectomy 1995 and total hysterectomy with oophorectomy 1996 for  endometriosis.   GYNECOLOGICAL HISTORY:  She menarched at early age of 14 and went to menopause at age 22 when she had a partial hysterectomy (1995) and one ovary removed (for endometriosis), following year she total hysterectomy and other ovary removed as well (1996). She had 1 pregnancy, her first child was born at age 23.  Breast feeding history I did not ask.  She received birth control pills and shots off and on for 20 years.  She was never exposed to fertility medications but did take hormone replacement therapy (Premarin) for about 5 years ending in 2001. She has NO family history of Breast/GYN/GI cancer. Last screening colonoscopy was 2007 and she had a bone density done 06/13/2008 at [place] office showing a normal BMD by WHO criteria. Last pap done 1997.  SOCIAL HISTORY: History   Social History  . Marital Status: Married    Spouse Name: Willaim Rayas. Foust    Number of Children: N/A  . Years of Education: N/A   Occupational History  . House wife    Social History Main Topics  . Smoking status: Never Smoker   . Smokeless tobacco: Never Used  . Alcohol Use: No  . Drug Use: No  . Sexual Activity: Yes    Birth Control/ Protection: Post-menopausal   Other Topics Concern  . Not on file   Social History Narrative  Patient lives in [place], [place]. She is a housewife. Both her parents are deceased father at age 16 and mother at age 9. Patient have 2 brothers and 2 sisters. She has a daughter Teton who lives in [place], [place]. Patient has good family support. She does not smoke or consume alcohol and no use of recreational drugs.      FAMILY HISTORY: Family History  Problem Relation Age of Onset  . Diabetes Mother   . Coronary artery disease Mother   . Diabetes Father   . Diabetes Sister   . Diabetes Brother   . Diabetes Brother    No history of cancer and her breast cancer does not appear to be hereditary type rather sporadic type.  ALLERGIES:   is allergic to eggs or egg-derived products; sulfa antibiotics; tetanus toxoids; multihance; and penicillins. They asked me about the flu shot but I will not MEDICATIONS:  Current Outpatient Prescriptions  Medication Sig Dispense Refill  . calcium citrate-vitamin D (CITRACAL+D) 315-200 MG-UNIT per tablet Take 1 tablet by mouth daily.      Marland Kitchen dexamethasone (DECADRON) 4 MG tablet Take 1 tablet (4 mg total) by mouth 2 (two) times daily. Start the day before Taxotere. Then again the day after chemo for 3 days.  30 tablet  1  . hydrochlorothiazide (HYDRODIURIL) 25 MG tablet Take 25 mg by mouth daily.      Marland Kitchen HYDROcodone-acetaminophen (NORCO) 5-325 MG per tablet Take 1-2 tablets by mouth every 4 (four) hours as needed for moderate pain or severe pain.  30 tablet  0  . lidocaine-prilocaine (EMLA) cream Apply one application to port-a-cath 1 - 2 hours prior to access.  30 g  1  . LORazepam (ATIVAN) 0.5 MG tablet Take 1 tablet (0.5 mg total) by mouth every 6 (six) hours as needed (Nausea or vomiting).  30 tablet  0  . Multiple Vitamin (MULTIVITAMIN) tablet Take 1 tablet by mouth daily.      . prochlorperazine (COMPAZINE) 10 MG tablet Take 1 tablet (10 mg total) by mouth every 6 (six) hours as needed (Nausea or vomiting).  30 tablet  1  . promethazine (PHENERGAN) 25 MG tablet Take 25 mg by mouth every 6 (six) hours as needed for nausea or vomiting.       No current facility-administered medications for this visit.   Facility-Administered Medications Ordered in Other Visits  Medication Dose Route Frequency Provider Last Rate Last Dose  . sodium chloride 0.9 % injection 10 mL  10 mL Intracatheter PRN Taichi Repka Marla Roe, MD   10 mL at 12/17/13 1708    REVIEW OF SYSTEMS:   Constitutional: Denies fevers, chills or abnormal night sweats Eyes: Denies blurriness of vision, double vision or watery eyes Ears, nose, mouth, throat, and face: Denies mucositis or sore throat Respiratory: Denies cough, dyspnea or  wheezes Cardiovascular: Denies palpitation, chest discomfort or lower extremity swelling Gastrointestinal:  Denies nausea, heartburn but did notice change in bowel habits some constipation for which she took prune juice and stool softner and also 2-3 episodes of diarrhea. Skin: Denies abnormal skin rashes Lymphatics: Denies new lymphadenopathy or easy bruising Neurological:Denies numbness, tingling or new weaknesses Behavioral/Psych: Mood is stable, no new changes  All other systems were reviewed with the patient and are negative.  PHYSICAL EXAMINATION:  ECOG PERFORMANCE STATUS: 1, KPS 100  Filed Vitals:   12/17/13 1140  BP: 136/72  Pulse: 80  Temp: 98.6 F (37 C)  Resp: 18  Filed Weights   12/17/13 1140  Weight: 171 lb 3.2 oz (77.656 kg)    GENERAL:alert, no distress and comfortable SKIN: skin color, texture, turgor are normal, no rashes or significant lesions EYES: normal, conjunctiva are pink and non-injected, sclera clear OROPHARYNX:no exudate, no erythema and lips, buccal mucosa, and tongue normal  NECK: supple, thyroid normal size, non-tender, without nodularity LYMPH:  no palpable lymphadenopathy in the cervical, axillary or inguinal LUNGS: clear to auscultation and percussion with normal breathing effort HEART: regular rate & rhythm and no murmurs and no lower extremity edema ABDOMEN:abdomen soft, non-tender and normal bowel sounds Musculoskeletal:no cyanosis of digits and no clubbing  PSYCH: alert & oriented x 3 with fluent speech NEURO: no focal motor/sensory deficits BREAST EXAM: Right breast fibrocystic feeling, no discrete or palpable mass noted, no skin or nipple changes, no adenopathy. Left breast biopsy site is swollen and tender, Underlying mass can  Hardly be fole less than 1 cm, no nodes felt in axilla. No supraclavicular or cervical LN felt on either side.   LABORATORY DATA:         On 11/15/2013  CA 27-29 was 23, CA 15-3 16, Vit D level  46   RADIOGRAPHIC STUDIES:  I have personally reviewed the radiological images as listed above and agreed with the findings in the report.These included Mammograms, Ultrasound, MRI breast and report on Bone density test.  PATHOLOGY DATA:  Reviewed.            ASSESSMENT:   1. This is a very pleasant 58 years-old Afro-american female from [facility] who presents with a left breast mass. This was picked on a mammogram and maximal diameter was 1.7 cm on Ultrasound however the MRI breast showed this mass to be large almost 5.1 cm in maximal dimension making it a clinical T3 tumor. Also it showed a suspicious looking solitary lymph node in axilla and that can make it a Clinical Stage III-A tumor. The tumor biology indicates a LUMINAL-B type which means ER/PR/HER2 + tumor. Tumor is strongly ER+100%, PR 42%,KI-67 Index 53% showing a brisk mitotic rate and grade is called grade 2 (but grading as per pathologist cannot be relied upon on initial biopsy). She underwent a repeat biopsy from breast on 11/09/2013 anteriorly and that confirmed the same triple positive breast cancer or Luminal B type. he second look [place] did not show a LN.  2. she can benefit from neoadjuvant chemotherapy esp since her tumor is her-2 positive and neoadjuvant chemotherapy has the potential of downsizing her tumor and making her a lumpectomy candidate. Also we can get an idea about the clinical behavior of the tumor in terms of the pathologic response caused by chemotherapy. Lot of these her2 + tumors respond dramatically and can lead to complete pathological response which then gives her a very good chance for remission and perhaps a cure.  3. The chemo-regimen I started is a NCCN-endorsed preferred regimen which has very good activity but avoids the cardiotoxicity and leukemogenesis risk associated with using anthracyclines. We use Taxotere and Carboplatin as the backbone chemotherapy and use Trastuzumab (Herceptin) and  pertuzumab (Perjeta) as the her-2 directed agents. So all 4 agents are used what we call as TCH-P regimen.  4. The data for this comes from [facility] trial which is a phase 2 neoadjuvant study looking at chemotherapies with and without anthracyclines. The design of this trial was     5. As noted above the complete PCR rate with using TCH-P was 63.6%  which is very impressive and incidence of left ventricular dysfunction was only 2.6%.  6.  The most common grade 3 adverse events for pertuzumab plus doctaxel and herceptin are neutropenia 45%, febrile neutropenia 8.4% and diarrhea about 6%. Other adverse efefcts which can be seen are decreased cardiac function, infusion related reactions, hypersensitivity reactions and anaphylaxis. In [facility] study which led to NCCN endorsement all grade diarrhea was 66% and something we will need to monitor and treat aggressively.   7. Patient tolerated her 1st cycle of chemotherapy well and was supported by neulasta. We are planning 6 cycles of chemotherapy and her-2 directed therapies to be followed by Breast MRI and surgical evaluation.  8. Her baseline echocardiogram done on 11/15/2013 shows an EF of 50-55%  9. Leukocytosis from use of neulasta.  10. Cycle 2 starts her today. Her breast exam shows remarkable improvement,ent and labs are normal.  PLAN:   1. Cycle 2 of TCH-P will be given today on 12/17/2013 and I will maintain the doses.  2. She will get neulasta post-chemotherapy.  3. Her breast exam does show 80% shrinkage of lump which is a good sign.  4. Continue mouth care and universal precautions.   5. RTC on 01/04/14 for labs and MD visit. Cycle 3 to be given on 01/07/14.   Bernadene Bell, MD Medical Hematologist/Oncologist [place] Pager: [phone] Office No: [phone]

## 2013-12-17 NOTE — Telephone Encounter (Signed)
gv adn pritned appt sched and avs for pt for Sept adn OCT....sed added tx. °

## 2013-12-18 ENCOUNTER — Ambulatory Visit (HOSPITAL_BASED_OUTPATIENT_CLINIC_OR_DEPARTMENT_OTHER): Payer: Managed Care, Other (non HMO)

## 2013-12-18 ENCOUNTER — Telehealth: Payer: Self-pay | Admitting: Hematology

## 2013-12-18 VITALS — BP 125/68 | HR 69 | Temp 98.4°F

## 2013-12-18 DIAGNOSIS — Z5189 Encounter for other specified aftercare: Secondary | ICD-10-CM

## 2013-12-18 DIAGNOSIS — C50412 Malignant neoplasm of upper-outer quadrant of left female breast: Secondary | ICD-10-CM

## 2013-12-18 DIAGNOSIS — C50019 Malignant neoplasm of nipple and areola, unspecified female breast: Secondary | ICD-10-CM

## 2013-12-18 MED ORDER — PEGFILGRASTIM INJECTION 6 MG/0.6ML
6.0000 mg | Freq: Once | SUBCUTANEOUS | Status: AC
  Administered 2013-12-18: 6 mg via SUBCUTANEOUS
  Filled 2013-12-18: qty 0.6

## 2013-12-18 NOTE — Telephone Encounter (Signed)
per pof cld & left pt message & adv of sch chge per AS-left appt for 10/2 lab & MD-add chemo 10/5

## 2014-01-02 ENCOUNTER — Telehealth: Payer: Self-pay | Admitting: Hematology

## 2014-01-02 NOTE — Telephone Encounter (Signed)
s.w. pt and advised on d.t change of appt.....pt ok and aware °

## 2014-01-04 ENCOUNTER — Other Ambulatory Visit: Payer: Managed Care, Other (non HMO)

## 2014-01-04 ENCOUNTER — Ambulatory Visit: Payer: Managed Care, Other (non HMO)

## 2014-01-07 ENCOUNTER — Telehealth: Payer: Self-pay | Admitting: Hematology

## 2014-01-07 ENCOUNTER — Encounter: Payer: Self-pay | Admitting: Hematology

## 2014-01-07 ENCOUNTER — Ambulatory Visit: Payer: Managed Care, Other (non HMO)

## 2014-01-07 ENCOUNTER — Ambulatory Visit: Payer: Managed Care, Other (non HMO) | Admitting: Physician Assistant

## 2014-01-07 ENCOUNTER — Other Ambulatory Visit: Payer: Managed Care, Other (non HMO)

## 2014-01-07 ENCOUNTER — Ambulatory Visit (HOSPITAL_BASED_OUTPATIENT_CLINIC_OR_DEPARTMENT_OTHER): Payer: Managed Care, Other (non HMO) | Admitting: Hematology

## 2014-01-07 ENCOUNTER — Ambulatory Visit (HOSPITAL_BASED_OUTPATIENT_CLINIC_OR_DEPARTMENT_OTHER): Payer: Managed Care, Other (non HMO)

## 2014-01-07 ENCOUNTER — Other Ambulatory Visit (HOSPITAL_BASED_OUTPATIENT_CLINIC_OR_DEPARTMENT_OTHER): Payer: Managed Care, Other (non HMO)

## 2014-01-07 VITALS — BP 129/75 | HR 77 | Temp 98.4°F | Resp 18 | Ht 64.0 in | Wt 172.4 lb

## 2014-01-07 DIAGNOSIS — C50919 Malignant neoplasm of unspecified site of unspecified female breast: Secondary | ICD-10-CM

## 2014-01-07 DIAGNOSIS — C50912 Malignant neoplasm of unspecified site of left female breast: Secondary | ICD-10-CM

## 2014-01-07 DIAGNOSIS — C50412 Malignant neoplasm of upper-outer quadrant of left female breast: Secondary | ICD-10-CM

## 2014-01-07 DIAGNOSIS — C50112 Malignant neoplasm of central portion of left female breast: Secondary | ICD-10-CM

## 2014-01-07 DIAGNOSIS — Z5111 Encounter for antineoplastic chemotherapy: Secondary | ICD-10-CM

## 2014-01-07 DIAGNOSIS — D72829 Elevated white blood cell count, unspecified: Secondary | ICD-10-CM

## 2014-01-07 DIAGNOSIS — Z5112 Encounter for antineoplastic immunotherapy: Secondary | ICD-10-CM

## 2014-01-07 DIAGNOSIS — Z170 Estrogen receptor positive status [ER+]: Secondary | ICD-10-CM

## 2014-01-07 LAB — CBC WITH DIFFERENTIAL/PLATELET
BASO%: 0.8 % (ref 0.0–2.0)
Basophils Absolute: 0.1 10*3/uL (ref 0.0–0.1)
EOS ABS: 0 10*3/uL (ref 0.0–0.5)
EOS%: 0 % (ref 0.0–7.0)
HCT: 34.1 % — ABNORMAL LOW (ref 34.8–46.6)
HGB: 11.1 g/dL — ABNORMAL LOW (ref 11.6–15.9)
LYMPH%: 12.3 % — ABNORMAL LOW (ref 14.0–49.7)
MCH: 29.7 pg (ref 25.1–34.0)
MCHC: 32.5 g/dL (ref 31.5–36.0)
MCV: 91.4 fL (ref 79.5–101.0)
MONO#: 0.9 10*3/uL (ref 0.1–0.9)
MONO%: 9.4 % (ref 0.0–14.0)
NEUT%: 77.5 % — ABNORMAL HIGH (ref 38.4–76.8)
NEUTROS ABS: 7.6 10*3/uL — AB (ref 1.5–6.5)
Platelets: 205 10*3/uL (ref 145–400)
RBC: 3.73 10*6/uL (ref 3.70–5.45)
RDW: 17.8 % — AB (ref 11.2–14.5)
WBC: 9.8 10*3/uL (ref 3.9–10.3)
lymph#: 1.2 10*3/uL (ref 0.9–3.3)

## 2014-01-07 LAB — COMPREHENSIVE METABOLIC PANEL (CC13)
ALK PHOS: 66 U/L (ref 40–150)
ALT: 26 U/L (ref 0–55)
AST: 19 U/L (ref 5–34)
Albumin: 3.6 g/dL (ref 3.5–5.0)
Anion Gap: 9 mEq/L (ref 3–11)
BUN: 14.4 mg/dL (ref 7.0–26.0)
CHLORIDE: 107 meq/L (ref 98–109)
CO2: 28 meq/L (ref 22–29)
Calcium: 9.9 mg/dL (ref 8.4–10.4)
Creatinine: 0.8 mg/dL (ref 0.6–1.1)
GLUCOSE: 108 mg/dL (ref 70–140)
Potassium: 3.8 mEq/L (ref 3.5–5.1)
SODIUM: 143 meq/L (ref 136–145)
TOTAL PROTEIN: 6.7 g/dL (ref 6.4–8.3)
Total Bilirubin: 0.52 mg/dL (ref 0.20–1.20)

## 2014-01-07 LAB — MAGNESIUM (CC13): MAGNESIUM: 2.5 mg/dL (ref 1.5–2.5)

## 2014-01-07 MED ORDER — SODIUM CHLORIDE 0.9 % IV SOLN
150.0000 mg | Freq: Once | INTRAVENOUS | Status: AC
  Administered 2014-01-07: 150 mg via INTRAVENOUS
  Filled 2014-01-07: qty 5

## 2014-01-07 MED ORDER — HEPARIN SOD (PORK) LOCK FLUSH 100 UNIT/ML IV SOLN
500.0000 [IU] | Freq: Once | INTRAVENOUS | Status: AC | PRN
  Administered 2014-01-07: 500 [IU]
  Filled 2014-01-07: qty 5

## 2014-01-07 MED ORDER — SODIUM CHLORIDE 0.9 % IJ SOLN
10.0000 mL | INTRAMUSCULAR | Status: DC | PRN
  Administered 2014-01-07: 10 mL
  Filled 2014-01-07: qty 10

## 2014-01-07 MED ORDER — PALONOSETRON HCL INJECTION 0.25 MG/5ML
0.2500 mg | Freq: Once | INTRAVENOUS | Status: AC
  Administered 2014-01-07: 0.25 mg via INTRAVENOUS

## 2014-01-07 MED ORDER — DEXAMETHASONE SODIUM PHOSPHATE 20 MG/5ML IJ SOLN
12.0000 mg | Freq: Once | INTRAMUSCULAR | Status: AC
  Administered 2014-01-07: 12 mg via INTRAVENOUS

## 2014-01-07 MED ORDER — DEXAMETHASONE SODIUM PHOSPHATE 20 MG/5ML IJ SOLN
INTRAMUSCULAR | Status: AC
  Filled 2014-01-07: qty 5

## 2014-01-07 MED ORDER — SODIUM CHLORIDE 0.9 % IV SOLN
650.0000 mg | Freq: Once | INTRAVENOUS | Status: DC
  Administered 2014-01-07: 650 mg via INTRAVENOUS
  Filled 2014-01-07: qty 65

## 2014-01-07 MED ORDER — PALONOSETRON HCL INJECTION 0.25 MG/5ML
INTRAVENOUS | Status: AC
  Filled 2014-01-07: qty 5

## 2014-01-07 MED ORDER — SODIUM CHLORIDE 0.9 % IV SOLN
420.0000 mg | Freq: Once | INTRAVENOUS | Status: AC
  Administered 2014-01-07: 420 mg via INTRAVENOUS
  Filled 2014-01-07: qty 14

## 2014-01-07 MED ORDER — DIPHENHYDRAMINE HCL 25 MG PO CAPS
50.0000 mg | ORAL_CAPSULE | Freq: Once | ORAL | Status: AC
  Administered 2014-01-07: 50 mg via ORAL

## 2014-01-07 MED ORDER — SODIUM CHLORIDE 0.9 % IV SOLN
Freq: Once | INTRAVENOUS | Status: AC
  Administered 2014-01-07: 12:00:00 via INTRAVENOUS

## 2014-01-07 MED ORDER — DIPHENHYDRAMINE HCL 25 MG PO CAPS
ORAL_CAPSULE | ORAL | Status: AC
  Filled 2014-01-07: qty 2

## 2014-01-07 MED ORDER — TRASTUZUMAB CHEMO INJECTION 440 MG
6.0000 mg/kg | Freq: Once | INTRAVENOUS | Status: AC
  Administered 2014-01-07: 462 mg via INTRAVENOUS
  Filled 2014-01-07: qty 22

## 2014-01-07 MED ORDER — DOCETAXEL CHEMO INJECTION 160 MG/16ML
75.0000 mg/m2 | Freq: Once | INTRAVENOUS | Status: AC
  Administered 2014-01-07: 140 mg via INTRAVENOUS
  Filled 2014-01-07: qty 14

## 2014-01-07 MED ORDER — ACETAMINOPHEN 325 MG PO TABS
ORAL_TABLET | ORAL | Status: AC
  Filled 2014-01-07: qty 2

## 2014-01-07 MED ORDER — ACETAMINOPHEN 325 MG PO TABS
650.0000 mg | ORAL_TABLET | Freq: Once | ORAL | Status: AC
  Administered 2014-01-07: 650 mg via ORAL

## 2014-01-07 NOTE — Patient Instructions (Signed)
[facility] Discharge Instructions for Patients Receiving Chemotherapy  Today you received the following chemotherapy agents Herceptin, Prjetta, Taxotere and Carboplatin  To help prevent nausea and vomiting after your treatment, we encourage you to take your nausea medication as prescribed.   If you develop nausea and vomiting that is not controlled by your nausea medication, call the clinic.   BELOW ARE SYMPTOMS THAT SHOULD BE REPORTED IMMEDIATELY:  *FEVER GREATER THAN 100.5 F  *CHILLS WITH OR WITHOUT FEVER  NAUSEA AND VOMITING THAT IS NOT CONTROLLED WITH YOUR NAUSEA MEDICATION  *UNUSUAL SHORTNESS OF BREATH  *UNUSUAL BRUISING OR BLEEDING  TENDERNESS IN MOUTH AND THROAT WITH OR WITHOUT PRESENCE OF ULCERS  *URINARY PROBLEMS  *BOWEL PROBLEMS  UNUSUAL RASH Items with * indicate a potential emergency and should be followed up as soon as possible.  Feel free to call the clinic you have any questions or concerns. The clinic phone number is [phone].

## 2014-01-07 NOTE — Telephone Encounter (Signed)
gv and printed appt sched and avs for pt for OCT..sed added tx. °

## 2014-01-07 NOTE — Progress Notes (Signed)
[facility] ONCOLOGY FOLLOW UP NOTE Date of Visit: 01/07/2014  Patient Care Team: [name], MD as PCP - General (Internal Medicine) [name], MD as Consulting Physician (General Surgery)  [place]:   1. 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  2. Patient starting Cycle # 3 today.  Breast Cancer History:   Rachel [name] 58 y.o. pleasant female who lives in [place], [place] was evaluated on 11/07/2013 with a diagnosis of a HER-2+ breast cancer. She was last seen here on 12/03/2013.   She had been getting annual mammograms since she turned 39. She never had a previous breast problem or biopsy but is known to have dense breasts as per her previous mammograms. Last year mammogram 10/16/2012 was fine. This year she went for her screening mammogram but did not feel a lump. On 10/17/2013 to the [facility] center of [facility] and was found to have distortion in left breast and spot compression views and ultrasound was recommended.LEFT CC AND MLO VIEWS showed abnormality in left breast. Spot Compression views showed persistent abnormality in left breast.US breast showed a hypoechoic irregular mass with surrounding distortion at 2'0 clock position located 4 cm from nipple measuring 1.1 x 0.9 x 0.7 cm. No left axillary lymphadenopathy noted. [place] was corresponding to mammographic results.  Patient underwent an US guided biopsy with Clip placement on 10/30/2013. Pathology from that (Accession no [id number]) showed an Invasive ductal carcinoma, DCIS and calcifications. Estrogen receptor (ER) 100%+, Progesterone receptor (PR) 42%+ and proliferative marker KI-67 index 53%+. Her-2 receptor status by FISH is positive. So it is a triple positive breast cancer, also known as "LUMINAL B molecular subtype". The grade of tumor invasive and DCIS was reported as grade II. Luminal B breast tumors are usually large in size, higher grade, high mitotic  index, high nodal involvement and up to 30% have p53 mutation. Their survival is also very good though little less than Luminal A subtype. Overall I will call it a favorable biology of tumor.  Interestingly the MRI breast performed on 11/05/2013 showed an enhancing mass which is much larger than the mass seen mammographically or sonographically.  FINDINGS: Breast composition: b. Scattered fibroglandular tissue. Background parenchymal enhancement: Moderate Right breast: No mass or abnormal enhancement. Left breast: In the anterior and middle thirds of the upper and upper outer quadrant of the left breast is a heterogeneous enhancing irregular mass measuring 5 x 2.5 x 5.1 cm in transverse, anterior-posterior and craniocaudal dimensions. Signal void artifact is seen along the lateral aspect of enhancement consistent with the biopsy clip. Along the anterior aspect of the irregular enhancing mass is a more focal 1.5 cm enhancement. Lymph nodes: There is a 9 mm level 1 left axillary lymph node that does not maintain a normal fatty hilum. It is concerning for metastatic involvement. Ancillary findings: None.  IMPRESSION: Irregular enhancing mass measuring 5.1 cm in the upper and upper-outer quadrants of the left breast. T Suspicious level 1 left axillary lymph node concerning for metastatic involving.           It was also recommended to get a biopsy of the anterior aspect of abnormal enhancement in the left breast is recommended to prove the extent of disease. That was done on 11/09/2013 with MR guidance. Using sterile technique, 2% Lidocaine, MRI guidance, and a 9 gauge vacuum assisted device, biopsy was performed of the suspicious area of enhancement in the subareolar left breast using  a lateral approach. At the conclusion of the procedure, a dumbbell tissue marker clip was deployed into the biopsy cavity.   Pathology from that Accession no [id number] showed Ductal cancer and DCIS. ER 100%, PR 67%, HER 2 by  CISH showed amplification RESULT RATIO OF HER2: CEP 17 SIGNALS 2.80, AVERAGE HER2 COPY NUMBER PER CELL 3.50. So it is showing the same phenotype that is Triple positive Breast cancer or LUMINAL B subtype.  A second-look Ultrasound was performed on 11/12/2013, showing normal fibroglandular and fibrofatty tissue. No enlarged or abnormal appearing lymph nodes are seen. No normal lymph nodes are not discretely visualized.IMPRESSION: Negative exam. Lymph node noted on the current breast MRI is not visualized sonographically.  Clinically and based on radiographic data so far it looks like a stage III-A breast cancer (size max 5.1 cm T3, possible+/- nodal involvement N1). Patient started her chemotherapy with TCH-P regimen and got 1st cycle on 11/26/2013. This was folllowed by Neulasta shot given on 11/27/2013.Second cycle given on 12/17/13 and today is her third cycle.       INTERVAL HISTORY:   [name]  tolerated chemotherapy well. No nausea or vomiting. Mild diarrhea alternating with constipation noted. She did get some backache with use of neulasta but it is getting better. No mouth sores. She did use claritin to minimize neulasta related pain and arthralgias. She denies any cardiac symptoms. Denies any fatigue or neuropathy. No skin rash. The breast lump on her left side on exam is smaller and hardly palpable consistent with excellent clinical response. She gets cycle 3 of chemo today.    MEDICAL HISTORY:  Past Medical History  Diagnosis Date  . Breast cancer   . Diabetes mellitus without complication   . Hypertension   . Alopecia areata   . Endometriosis   . Hypercholesteremia     SURGICAL HISTORY: Past Surgical History  Procedure Laterality Date  . Abdominal hysterectomy      BSO  . Colonoscopy    . Portacath placement N/A 11/20/2013    Procedure: INSERTION PORT-A-CATH;  Surgeon: [name], MD;  Location: [facility];  Service: General;  Laterality: N/A;   Partial hysterectomy 1995 and  total hysterectomy with oophorectomy 1996 for endometriosis.   GYNECOLOGICAL HISTORY:  She menarched at early age of 34 and went to menopause at age 79 when she had a partial hysterectomy (1995) and one ovary removed (for endometriosis), following year she total hysterectomy and other ovary removed as well (1996). She had 1 pregnancy, her first child was born at age 46.  Breast feeding history I did not ask.  She received birth control pills and shots off and on for 20 years.  She was never exposed to fertility medications but did take hormone replacement therapy (Premarin) for about 5 years ending in 2001. She has NO family history of Breast/GYN/GI cancer. Last screening colonoscopy was 2007 and she had a bone density done 06/13/2008 at [facility] office showing a normal BMD by WHO criteria. Last pap done 1997.  SOCIAL HISTORY: History   Social History  . Marital Status: Married    Spouse Name: Willaim Rayas. Foust    Number of Children: N/A  . Years of Education: N/A   Occupational History  . House wife    Social History Main Topics  . Smoking status: Never Smoker   . Smokeless tobacco: Never Used  . Alcohol Use: No  . Drug Use: No  . Sexual Activity: Yes    Birth Control/ Protection: Post-menopausal  Other Topics Concern  . Not on file   Social History Narrative   Patient lives in [place], [place]. She is a housewife. Both her parents are deceased father at age 57 and mother at age 16. Patient have 2 brothers and 2 sisters. She has a daughter Rachel [name] who lives in [place], [place]. Patient has good family support. She does not smoke or consume alcohol and no use of recreational drugs.      FAMILY HISTORY: Family History  Problem Relation Age of Onset  . Diabetes Mother   . Coronary artery disease Mother   . Diabetes Father   . Diabetes Sister   . Diabetes Brother   . Diabetes Brother    No history of cancer and her breast cancer does not appear to be  hereditary type rather sporadic type.  ALLERGIES:  is allergic to eggs or egg-derived products; sulfa antibiotics; tetanus toxoids; multihance; and penicillins. They asked me about the flu shot but I will not MEDICATIONS:  Current Outpatient Prescriptions  Medication Sig Dispense Refill  . calcium citrate-vitamin D (CITRACAL+D) 315-200 MG-UNIT per tablet Take 1 tablet by mouth daily.      Marland Kitchen dexamethasone (DECADRON) 4 MG tablet Take 1 tablet (4 mg total) by mouth 2 (two) times daily. Start the day before Taxotere. Then again the day after chemo for 3 days.  30 tablet  1  . hydrochlorothiazide (HYDRODIURIL) 25 MG tablet Take 25 mg by mouth daily.      Marland Kitchen HYDROcodone-acetaminophen (NORCO) 5-325 MG per tablet Take 1-2 tablets by mouth every 4 (four) hours as needed for moderate pain or severe pain.  30 tablet  0  . lidocaine-prilocaine (EMLA) cream Apply one application to port-a-cath 1 - 2 hours prior to access.  30 g  1  . LORazepam (ATIVAN) 0.5 MG tablet Take 1 tablet (0.5 mg total) by mouth every 6 (six) hours as needed (Nausea or vomiting).  30 tablet  0  . Multiple Vitamin (MULTIVITAMIN) tablet Take 1 tablet by mouth daily.      . prochlorperazine (COMPAZINE) 10 MG tablet Take 1 tablet (10 mg total) by mouth every 6 (six) hours as needed (Nausea or vomiting).  30 tablet  1  . promethazine (PHENERGAN) 25 MG tablet Take 25 mg by mouth every 6 (six) hours as needed for nausea or vomiting.       No current facility-administered medications for this visit.   Facility-Administered Medications Ordered in Other Visits  Medication Dose Route Frequency Provider Last Rate Last Dose  . CARBOplatin (PARAPLATIN) 650 mg in sodium chloride 0.9 % 250 mL chemo infusion  650 mg Intravenous Once Amayra Kiedrowski Marla Roe, MD      . DOCEtaxel (TAXOTERE) 140 mg in dextrose 5 % 250 mL chemo infusion  75 mg/m2 (Treatment Plan Actual) Intravenous Once Jariah Tarkowski Marla Roe, MD 264 mL/hr at 01/07/14 1500 140 mg at 01/07/14  1500  . heparin lock flush 100 unit/mL  500 Units Intracatheter Once PRN Griffin Gerrard Marla Roe, MD      . sodium chloride 0.9 % injection 10 mL  10 mL Intracatheter PRN Syble Picco Marla Roe, MD        REVIEW OF SYSTEMS:   Constitutional: Denies fevers, chills or abnormal night sweats Eyes: Denies blurriness of vision, double vision or watery eyes Ears, nose, mouth, throat, and face: Denies mucositis or sore throat Respiratory: Denies cough, dyspnea or wheezes Cardiovascular: Denies palpitation, chest discomfort or lower extremity swelling Gastrointestinal:  Denies  nausea, heartburn but did notice change in bowel habits some constipation for which she took prune juice and stool softner and also 2-3 episodes of diarrhea. Skin: Denies abnormal skin rashes Lymphatics: Denies new lymphadenopathy or easy bruising Neurological:Denies numbness, tingling or new weaknesses Behavioral/Psych: Mood is stable, no new changes  All other systems were reviewed with the patient and are negative.  PHYSICAL EXAMINATION:  ECOG PERFORMANCE STATUS: 1, KPS 100  Filed Vitals:   01/07/14 1016  BP: 129/75  Pulse: 77  Temp: 98.4 F (36.9 C)  Resp: 18   Filed Weights   01/07/14 1016  Weight: 172 lb 6.4 oz (78.2 kg)    GENERAL:alert, no distress and comfortable SKIN: skin color, texture, turgor are normal, no rashes or significant lesions EYES: normal, conjunctiva are pink and non-injected, sclera clear OROPHARYNX:no exudate, no erythema and lips, buccal mucosa, and tongue normal  NECK: supple, thyroid normal size, non-tender, without nodularity LYMPH:  no palpable lymphadenopathy in the cervical, axillary or inguinal LUNGS: clear to auscultation and percussion with normal breathing effort HEART: regular rate & rhythm and no murmurs and no lower extremity edema ABDOMEN:abdomen soft, non-tender and normal bowel sounds Musculoskeletal:no cyanosis of digits and no clubbing  PSYCH: alert & oriented x 3 with  fluent speech NEURO: no focal motor/sensory deficits BREAST EXAM: Right breast fibrocystic feeling, no discrete or palpable mass noted, no skin or nipple changes, no adenopathy. Left breast biopsy site is swollen and tender, Underlying mass can  Hardly be fole less than 1 cm, no nodes felt in axilla. No supraclavicular or cervical LN felt on either side.   LABORATORY DATA:       On 11/15/2013  CA 27-29 was 23, CA 15-3 16, Vit D level 46   RADIOGRAPHIC STUDIES:  I have personally reviewed the radiological images as listed above and agreed with the findings in the report.These included Mammograms, Ultrasound, MRI breast and report on Bone density test.  PATHOLOGY DATA:  Reviewed.            ASSESSMENT:   1. This is a very pleasant 58 years-old Afro-american female from [facility] who presents with a left Invasive Ductal Carcinoma. This was picked on a mammogram and maximal diameter was 1.7 cm on Ultrasound however the MRI breast showed this mass to be large almost 5.1 cm in maximal dimension making it a clinical T3 tumor. Also it showed a suspicious looking solitary lymph node in axilla and that can make it a Clinical Stage III-A tumor. The tumor biology indicates a LUMINAL-B type which means ER/PR/HER2 + tumor. Tumor is strongly ER+100%, PR 42%,KI-67 Index 53% showing a brisk mitotic rate and grade is called grade 2 (but grading as per pathologist cannot be relied upon on initial biopsy). She underwent a repeat biopsy from breast on 11/09/2013 anteriorly and that confirmed the same triple positive breast cancer or Luminal B type. he second look [place] did not show a LN.  2. she can benefit from neoadjuvant chemotherapy esp since her tumor is her-2 positive and neoadjuvant chemotherapy has the potential of downsizing her tumor and making her a lumpectomy candidate. Also we can get an idea about the clinical behavior of the tumor in terms of the pathologic response caused by chemotherapy. Lot  of these her2 + tumors respond dramatically and can lead to complete pathological response which then gives her a very good chance for remission and perhaps a cure.  3. The chemo-regimen I started is a NCCN-endorsed preferred regimen  which has very good activity but avoids the cardiotoxicity and leukemogenesis risk associated with using anthracyclines. We use Taxotere and Carboplatin as the backbone chemotherapy and use Trastuzumab (Herceptin) and pertuzumab (Perjeta) as the her-2 directed agents. So all 4 agents are used what we call as TCH-P regimen.  4. The data for this comes from [facility] trial which is a phase 2 neoadjuvant study looking at chemotherapies with and without anthracyclines. The design of this trial was     5. As noted above the complete PCR rate with using TCH-P was 63.6% which is very impressive and incidence of left ventricular dysfunction was only 2.6%.  6.  The most common grade 3 adverse events for pertuzumab plus doctaxel and herceptin are neutropenia 45%, febrile neutropenia 8.4% and diarrhea about 6%. Other adverse efefcts which can be seen are decreased cardiac function, infusion related reactions, hypersensitivity reactions and anaphylaxis. In [facility] study which led to NCCN endorsement all grade diarrhea was 66% and something we will need to monitor and treat aggressively.   7. Patient tolerated her 1st  And second cycle of chemotherapy well and was supported by neulasta. We are planning 6 cycles of chemotherapy and her-2 directed therapies to be followed by Breast MRI and surgical evaluation.  8. Her baseline echocardiogram done on 11/15/2013 shows an EF of 50-55%  9. Leukocytosis from use of neulasta.  10. Cycle 3 starts her today. Her breast exam shows remarkable improvement,ent and labs are normal.  PLAN:   1. Cycle 3 of TCH-P will be given today on 12/17/2013 and I will maintain the doses.  2. She will get neulasta post-chemotherapy.  3. Her breast exam  does show 80% shrinkage of lump which is a good sign.  4. Continue mouth care and universal precautions.   5. RTC on 01/25/14 for labs and MD visit. Cycle 4 to be given on 01/28/14.  6. Post-chemotherapy we will do another MRI breast and refer back to Dr Barry Dienes.   Bernadene Bell, MD Medical Hematologist/Oncologist [facility] Pager: [phone] Office No: [phone]

## 2014-01-08 ENCOUNTER — Other Ambulatory Visit: Payer: Self-pay | Admitting: Hematology

## 2014-01-08 ENCOUNTER — Ambulatory Visit (HOSPITAL_BASED_OUTPATIENT_CLINIC_OR_DEPARTMENT_OTHER): Payer: Managed Care, Other (non HMO)

## 2014-01-08 VITALS — BP 125/70 | HR 81 | Temp 98.6°F

## 2014-01-08 DIAGNOSIS — Z08 Encounter for follow-up examination after completed treatment for malignant neoplasm: Secondary | ICD-10-CM

## 2014-01-08 DIAGNOSIS — C50412 Malignant neoplasm of upper-outer quadrant of left female breast: Secondary | ICD-10-CM

## 2014-01-08 MED ORDER — PEGFILGRASTIM INJECTION 6 MG/0.6ML
6.0000 mg | Freq: Once | SUBCUTANEOUS | Status: AC
  Administered 2014-01-08: 6 mg via SUBCUTANEOUS
  Filled 2014-01-08: qty 0.6

## 2014-01-17 ENCOUNTER — Telehealth: Payer: Self-pay | Admitting: Hematology

## 2014-01-17 NOTE — Telephone Encounter (Signed)
Lft msg per provider schedule r/s 10/23 to 10/22, advised pt if she couldn't make this time to call our office and ask for scheduling also mailed copy to pt  .... Cherylann Banas

## 2014-01-24 ENCOUNTER — Other Ambulatory Visit (HOSPITAL_BASED_OUTPATIENT_CLINIC_OR_DEPARTMENT_OTHER): Payer: Managed Care, Other (non HMO)

## 2014-01-24 ENCOUNTER — Encounter: Payer: Self-pay | Admitting: Hematology

## 2014-01-24 ENCOUNTER — Telehealth: Payer: Self-pay | Admitting: Hematology

## 2014-01-24 ENCOUNTER — Ambulatory Visit (HOSPITAL_BASED_OUTPATIENT_CLINIC_OR_DEPARTMENT_OTHER): Payer: Managed Care, Other (non HMO) | Admitting: Hematology

## 2014-01-24 VITALS — BP 117/59 | HR 96 | Temp 98.5°F | Resp 18 | Ht 64.0 in | Wt 175.9 lb

## 2014-01-24 DIAGNOSIS — D72829 Elevated white blood cell count, unspecified: Secondary | ICD-10-CM

## 2014-01-24 DIAGNOSIS — C50912 Malignant neoplasm of unspecified site of left female breast: Secondary | ICD-10-CM

## 2014-01-24 DIAGNOSIS — Z170 Estrogen receptor positive status [ER+]: Secondary | ICD-10-CM

## 2014-01-24 DIAGNOSIS — C50012 Malignant neoplasm of nipple and areola, left female breast: Secondary | ICD-10-CM

## 2014-01-24 LAB — CBC WITH DIFFERENTIAL/PLATELET
BASO%: 0.6 % (ref 0.0–2.0)
Basophils Absolute: 0.1 10*3/uL (ref 0.0–0.1)
EOS%: 0 % (ref 0.0–7.0)
Eosinophils Absolute: 0 10*3/uL (ref 0.0–0.5)
HEMATOCRIT: 31.4 % — AB (ref 34.8–46.6)
HEMOGLOBIN: 10.2 g/dL — AB (ref 11.6–15.9)
LYMPH#: 2.2 10*3/uL (ref 0.9–3.3)
LYMPH%: 26.6 % (ref 14.0–49.7)
MCH: 30.2 pg (ref 25.1–34.0)
MCHC: 32.5 g/dL (ref 31.5–36.0)
MCV: 92.9 fL (ref 79.5–101.0)
MONO#: 0.9 10*3/uL (ref 0.1–0.9)
MONO%: 10.6 % (ref 0.0–14.0)
NEUT%: 62.2 % (ref 38.4–76.8)
NEUTROS ABS: 5.2 10*3/uL (ref 1.5–6.5)
Platelets: 171 10*3/uL (ref 145–400)
RBC: 3.38 10*6/uL — ABNORMAL LOW (ref 3.70–5.45)
RDW: 19.1 % — ABNORMAL HIGH (ref 11.2–14.5)
WBC: 8.4 10*3/uL (ref 3.9–10.3)

## 2014-01-24 LAB — COMPREHENSIVE METABOLIC PANEL (CC13)
ALBUMIN: 3.3 g/dL — AB (ref 3.5–5.0)
ALT: 34 U/L (ref 0–55)
AST: 26 U/L (ref 5–34)
Alkaline Phosphatase: 65 U/L (ref 40–150)
Anion Gap: 8 mEq/L (ref 3–11)
BUN: 16.3 mg/dL (ref 7.0–26.0)
CHLORIDE: 107 meq/L (ref 98–109)
CO2: 31 mEq/L — ABNORMAL HIGH (ref 22–29)
Calcium: 9.4 mg/dL (ref 8.4–10.4)
Creatinine: 1 mg/dL (ref 0.6–1.1)
Glucose: 97 mg/dl (ref 70–140)
POTASSIUM: 3.5 meq/L (ref 3.5–5.1)
Sodium: 146 mEq/L — ABNORMAL HIGH (ref 136–145)
Total Bilirubin: 0.48 mg/dL (ref 0.20–1.20)
Total Protein: 5.9 g/dL — ABNORMAL LOW (ref 6.4–8.3)

## 2014-01-24 LAB — HOLD TUBE, BLOOD BANK

## 2014-01-24 NOTE — Progress Notes (Signed)
[facility] ONCOLOGY FOLLOW UP NOTE Date of Visit: 01/24/2014  Patient Care Team: [name], MD as PCP - General (Internal Medicine) [name], MD as Consulting Physician (General Surgery)  [place]:   1. 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  2. Patient starting Cycle # 4 on 01/28/14.  Breast Cancer History:   Rachel [name] 58 y.o. pleasant female who lives in [place], [place] was evaluated on 11/07/2013 with a diagnosis of a HER-2+ breast cancer. She was last seen here on 12/03/2013.   She had been getting annual mammograms since she turned 39. She never had a previous breast problem or biopsy but is known to have dense breasts as per her previous mammograms. Last year mammogram 10/16/2012 was fine. This year she went for her screening mammogram but did not feel a lump. On 10/17/2013 to the [facility] center of [facility] and was found to have distortion in left breast and spot compression views and ultrasound was recommended.LEFT CC AND MLO VIEWS showed abnormality in left breast. Spot Compression views showed persistent abnormality in left breast.US breast showed a hypoechoic irregular mass with surrounding distortion at 2'0 clock position located 4 cm from nipple measuring 1.1 x 0.9 x 0.7 cm. No left axillary lymphadenopathy noted. [place] was corresponding to mammographic results.  Patient underwent an US guided biopsy with Clip placement on 10/30/2013. Pathology from that (Accession no [id number]) showed an Invasive ductal carcinoma, DCIS and calcifications. Estrogen receptor (ER) 100%+, Progesterone receptor (PR) 42%+ and proliferative marker KI-67 index 53%+. Her-2 receptor status by FISH is positive. So it is a triple positive breast cancer, also known as "LUMINAL B molecular subtype". The grade of tumor invasive and DCIS was reported as grade II. Luminal B breast tumors are usually large in size, higher grade, high  mitotic index, high nodal involvement and up to 30% have p53 mutation. Their survival is also very good though little less than Luminal A subtype. Overall I will call it a favorable biology of tumor.  Interestingly the MRI breast performed on 11/05/2013 showed an enhancing mass which is much larger than the mass seen mammographically or sonographically.  FINDINGS: Breast composition: b. Scattered fibroglandular tissue. Background parenchymal enhancement: Moderate Right breast: No mass or abnormal enhancement. Left breast: In the anterior and middle thirds of the upper and upper outer quadrant of the left breast is a heterogeneous enhancing irregular mass measuring 5 x 2.5 x 5.1 cm in transverse, anterior-posterior and craniocaudal dimensions. Signal void artifact is seen along the lateral aspect of enhancement consistent with the biopsy clip. Along the anterior aspect of the irregular enhancing mass is a more focal 1.5 cm enhancement. Lymph nodes: There is a 9 mm level 1 left axillary lymph node that does not maintain a normal fatty hilum. It is concerning for metastatic involvement. Ancillary findings: None.  IMPRESSION: Irregular enhancing mass measuring 5.1 cm in the upper and upper-outer quadrants of the left breast. T Suspicious level 1 left axillary lymph node concerning for metastatic involving.           It was also recommended to get a biopsy of the anterior aspect of abnormal enhancement in the left breast is recommended to prove the extent of disease. That was done on 11/09/2013 with MR guidance. Using sterile technique, 2% Lidocaine, MRI guidance, and a 9 gauge vacuum assisted device, biopsy was performed of the suspicious area of enhancement in the subareolar left breast  using a lateral approach. At the conclusion of the procedure, a dumbbell tissue marker clip was deployed into the biopsy cavity.   Pathology from that Accession no [id number] showed Ductal cancer and DCIS. ER 100%, PR 67%,  HER 2 by CISH showed amplification RESULT RATIO OF HER2: CEP 17 SIGNALS 2.80, AVERAGE HER2 COPY NUMBER PER CELL 3.50. So it is showing the same phenotype that is Triple positive Breast cancer or LUMINAL B subtype.  A second-look Ultrasound was performed on 11/12/2013, showing normal fibroglandular and fibrofatty tissue. No enlarged or abnormal appearing lymph nodes are seen. No normal lymph nodes are not discretely visualized.IMPRESSION: Negative exam. Lymph node noted on the current breast MRI is not visualized sonographically.  Clinically and based on radiographic data so far it looks like a stage III-A breast cancer (size max 5.1 cm T3, possible+/- nodal involvement N1). Patient started her chemotherapy with TCH-P regimen and got 1st cycle on 11/26/2013. This was folllowed by Neulasta shot given on 11/27/2013.Second cycle given on 12/17/13 and today is her third cycle.       INTERVAL HISTORY:   [place]  tolerated 3rd cycle of chemotherapy well. No nausea or vomiting. Mild diarrhea alternating with constipation noted. She did get some backache with use of neulasta but it is getting better. No mouth sores. She did use claritin to minimize neulasta related pain and arthralgias. She denies any cardiac symptoms. Denies any fatigue or neuropathy. No skin rash. The breast lump on her left side on exam is smaller and hardly palpable consistent with excellent clinical response. She completed 3 cycles and due to get cycle 4 on 01/28/2014.  She was last seen here on 01/07/2014 and comes for follow up today. She has gained some weight from Decadron and also mildly anemic from chemotherapy but not symptomatic. [place].   MEDICAL HISTORY:  Past Medical History  Diagnosis Date  . Breast cancer   . Diabetes mellitus without complication   . Hypertension   . Alopecia areata   . Endometriosis   . Hypercholesteremia     SURGICAL HISTORY: Past Surgical History   Procedure Laterality Date  . Abdominal hysterectomy      BSO  . Colonoscopy    . Portacath placement N/A 11/20/2013    Procedure: INSERTION PORT-A-CATH;  Surgeon: [name], MD;  Location: [facility];  Service: General;  Laterality: N/A;   Partial hysterectomy 1995 and total hysterectomy with oophorectomy 1996 for endometriosis.   GYNECOLOGICAL HISTORY:  She menarched at early age of 39 and went to menopause at age 70 when she had a partial hysterectomy (1995) and one ovary removed (for endometriosis), following year she total hysterectomy and other ovary removed as well (1996). She had 1 pregnancy, her first child was born at age 12.  Breast feeding history I did not ask.  She received birth control pills and shots off and on for 20 years.  She was never exposed to fertility medications but did take hormone replacement therapy (Premarin) for about 5 years ending in 2001. She has NO family history of Breast/GYN/GI cancer. Last screening colonoscopy was 2007 and she had a bone density done 06/13/2008 at [place] office showing a normal BMD by WHO criteria. Last pap done 1997.  SOCIAL HISTORY: History   Social History  . Marital Status: Married    Spouse Name: Willaim Rayas. Foust    Number of Children: N/A  . Years of Education: N/A   Occupational  History  . House wife    Social History Main Topics  . Smoking status: Never Smoker   . Smokeless tobacco: Never Used  . Alcohol Use: No  . Drug Use: No  . Sexual Activity: Yes    Birth Control/ Protection: Post-menopausal   Other Topics Concern  . Not on file   Social History Narrative   Patient lives in [place], [place]. She is a housewife. Both her parents are deceased father at age 62 and mother at age 74. Patient have 2 brothers and 2 sisters. She has a daughter Radisson who lives in [place], [place]. Patient has good family support. She does not smoke or consume alcohol and no use of recreational drugs.      FAMILY  HISTORY: Family History  Problem Relation Age of Onset  . Diabetes Mother   . Coronary artery disease Mother   . Diabetes Father   . Diabetes Sister   . Diabetes Brother   . Diabetes Brother    No history of cancer and her breast cancer does not appear to be hereditary type rather sporadic type.  ALLERGIES:  is allergic to eggs or egg-derived products; sulfa antibiotics; tetanus toxoids; multihance; and penicillins. They asked me about the flu shot but I will not MEDICATIONS:  Current Outpatient Prescriptions  Medication Sig Dispense Refill  . calcium citrate-vitamin D (CITRACAL+D) 315-200 MG-UNIT per tablet Take 1 tablet by mouth daily.      Marland Kitchen dexamethasone (DECADRON) 4 MG tablet Take 1 tablet (4 mg total) by mouth 2 (two) times daily. Start the day before Taxotere. Then again the day after chemo for 3 days.  30 tablet  1  . hydrochlorothiazide (HYDRODIURIL) 25 MG tablet Take 25 mg by mouth daily.      Marland Kitchen HYDROcodone-acetaminophen (NORCO) 5-325 MG per tablet Take 1-2 tablets by mouth every 4 (four) hours as needed for moderate pain or severe pain.  30 tablet  0  . lidocaine-prilocaine (EMLA) cream Apply one application to port-a-cath 1 - 2 hours prior to access.  30 g  1  . LORazepam (ATIVAN) 0.5 MG tablet Take 1 tablet (0.5 mg total) by mouth every 6 (six) hours as needed (Nausea or vomiting).  30 tablet  0  . Multiple Vitamin (MULTIVITAMIN) tablet Take 1 tablet by mouth daily.      . prochlorperazine (COMPAZINE) 10 MG tablet Take 1 tablet (10 mg total) by mouth every 6 (six) hours as needed (Nausea or vomiting).  30 tablet  1  . promethazine (PHENERGAN) 25 MG tablet Take 25 mg by mouth every 6 (six) hours as needed for nausea or vomiting.       No current facility-administered medications for this visit.   Facility-Administered Medications Ordered in Other Visits  Medication Dose Route Frequency Provider Last Rate Last Dose  . CARBOplatin (PARAPLATIN) 650 mg in sodium chloride 0.9 %  250 mL chemo infusion  650 mg Intravenous Once Ezabella Teska Marla Roe, MD      . sodium chloride 0.9 % injection 10 mL  10 mL Intracatheter PRN Franki Stemen Marla Roe, MD   10 mL at 01/07/14 1648    REVIEW OF SYSTEMS:   Constitutional: Denies fevers, chills or abnormal night sweats Eyes: Denies blurriness of vision, double vision or watery eyes Ears, nose, mouth, throat, and face: Denies mucositis or sore throat Respiratory: Denies cough, dyspnea or wheezes Cardiovascular: Denies palpitation, chest discomfort or lower extremity swelling Gastrointestinal:  Denies nausea, heartburn but did notice  change in bowel habits some constipation for which she took prune juice and stool softner and also 2-3 episodes of diarrhea. Skin: Denies abnormal skin rashes Lymphatics: Denies new lymphadenopathy or easy bruising Neurological:Denies numbness, tingling or new weaknesses Behavioral/Psych: Mood is stable, no new changes  All other systems were reviewed with the patient and are negative.  PHYSICAL EXAMINATION:  ECOG PERFORMANCE STATUS: 1, KPS 100  Filed Vitals:   01/24/14 1411  BP: 117/59  Pulse: 96  Temp: 98.5 F (36.9 C)  Resp: 18   Filed Weights   01/24/14 1411  Weight: 175 lb 14.4 oz (79.788 kg)    GENERAL:alert, no distress and comfortable SKIN: skin color, texture, turgor are normal, no rashes or significant lesions EYES: normal, conjunctiva are pink and non-injected, sclera clear OROPHARYNX:no exudate, no erythema and lips, buccal mucosa, and tongue normal  NECK: supple, thyroid normal size, non-tender, without nodularity LYMPH:  no palpable lymphadenopathy in the cervical, axillary or inguinal LUNGS: clear to auscultation and percussion with normal breathing effort HEART: regular rate & rhythm and no murmurs and no lower extremity edema ABDOMEN:abdomen soft, non-tender and normal bowel sounds Musculoskeletal:no cyanosis of digits and no clubbing  PSYCH: alert & oriented x 3 with  fluent speech NEURO: no focal motor/sensory deficits BREAST EXAM: Right breast fibrocystic feeling, no discrete or palpable mass noted, no skin or nipple changes, no adenopathy. Left breast underlying mass is very small less than 1 cm, no nodes felt in axilla. No supraclavicular or cervical LN felt on either side.   LABORATORY DATA:        On 11/15/2013  CA 27-29 was 23, CA 15-3 16, Vit D level 46   RADIOGRAPHIC STUDIES:  I have personally reviewed the radiological images as listed above and agreed with the findings in the report.These included Mammograms, Ultrasound, MRI breast and report on Bone density test.  PATHOLOGY DATA:  Reviewed.            ASSESSMENT:   1. This is a very pleasant 58 years-old Afro-american female from [facility] who presents with a left Invasive Ductal Carcinoma. This was picked on a mammogram and maximal diameter was 1.7 cm on Ultrasound however the MRI breast showed this mass to be large almost 5.1 cm in maximal dimension making it a clinical T3 tumor. Also it showed a suspicious looking solitary lymph node in axilla and that can make it a Clinical Stage III-A tumor. The tumor biology indicates a LUMINAL-B type which means ER/PR/HER2 + tumor. Tumor is strongly ER+100%, PR 42%,KI-67 Index 53% showing a brisk mitotic rate and grade is called grade 2 (but grading as per pathologist cannot be relied upon on initial biopsy). She underwent a repeat biopsy from breast on 11/09/2013 anteriorly and that confirmed the same triple positive breast cancer or Luminal B type. he second look [place] did not show a LN.  2. she can benefit from neoadjuvant chemotherapy esp since her tumor is her-2 positive and neoadjuvant chemotherapy has the potential of downsizing her tumor and making her a lumpectomy candidate. Also we can get an idea about the clinical behavior of the tumor in terms of the pathologic response caused by chemotherapy. Lot of these her2 + tumors respond  dramatically and can lead to complete pathological response which then gives her a very good chance for remission and perhaps a cure.  3. The chemo-regimen I started is a NCCN-endorsed preferred regimen which has very good activity but avoids the cardiotoxicity and leukemogenesis risk  associated with using anthracyclines. We use Taxotere and Carboplatin as the backbone chemotherapy and use Trastuzumab (Herceptin) and pertuzumab (Perjeta) as the her-2 directed agents. So all 4 agents are used what we call as TCH-P regimen.  4. The data for this comes from [facility] trial which is a phase 2 neoadjuvant study looking at chemotherapies with and without anthracyclines. The design of this trial was     5. As noted above the complete PCR rate with using TCH-P was 63.6% which is very impressive and incidence of left ventricular dysfunction was only 2.6%.  6.  The most common grade 3 adverse events for pertuzumab plus doctaxel and herceptin are neutropenia 45%, febrile neutropenia 8.4% and diarrhea about 6%. Other adverse efefcts which can be seen are decreased cardiac function, infusion related reactions, hypersensitivity reactions and anaphylaxis. In [facility] study which led to NCCN endorsement all grade diarrhea was 66% and something we will need to monitor and treat aggressively.   7. Patient tolerated her 1st  And second cycle of chemotherapy well and was supported by neulasta. We are planning 6 cycles of chemotherapy and her-2 directed therapies to be followed by Breast MRI and surgical evaluation.  8. Her baseline echocardiogram done on 11/15/2013 shows an EF of 50-55%  9. Leukocytosis from use of neulasta.  10. Cycle 3 starts her today. Her breast exam shows remarkable improvement,ent and labs are normal.  PLAN:   1. Cycle 4 of TCH-P will be given on 01/28/2014 and I will maintain the doses.  2. She will get neulasta post-chemotherapy.  3. Her breast exam does show 80-85% shrinkage of lump  which is a good sign.  4. Continue mouth care and universal precautions.   5. RTC on 01/25/14 for labs and MD visit. Cycle 4 to be given on 01/28/14.  6. Post-chemotherapy we will do another MRI breast and refer back to Dr Barry Dienes.   Bernadene Bell, MD Medical Hematologist/Oncologist [place] Pager: [phone] Office No: [phone]

## 2014-01-24 NOTE — Telephone Encounter (Signed)
gv adn printed appt sched and avs for pt for OCT and NOV....sed added tx. °

## 2014-01-25 ENCOUNTER — Other Ambulatory Visit: Payer: Managed Care, Other (non HMO)

## 2014-01-25 ENCOUNTER — Ambulatory Visit: Payer: Managed Care, Other (non HMO)

## 2014-01-28 ENCOUNTER — Other Ambulatory Visit: Payer: Self-pay | Admitting: Hematology

## 2014-01-28 ENCOUNTER — Ambulatory Visit (HOSPITAL_BASED_OUTPATIENT_CLINIC_OR_DEPARTMENT_OTHER): Payer: Managed Care, Other (non HMO)

## 2014-01-28 VITALS — BP 124/70 | HR 92 | Temp 98.2°F | Resp 16

## 2014-01-28 DIAGNOSIS — C50412 Malignant neoplasm of upper-outer quadrant of left female breast: Secondary | ICD-10-CM

## 2014-01-28 DIAGNOSIS — Z5112 Encounter for antineoplastic immunotherapy: Secondary | ICD-10-CM

## 2014-01-28 DIAGNOSIS — Z5111 Encounter for antineoplastic chemotherapy: Secondary | ICD-10-CM

## 2014-01-28 DIAGNOSIS — C50012 Malignant neoplasm of nipple and areola, left female breast: Secondary | ICD-10-CM

## 2014-01-28 MED ORDER — ACETAMINOPHEN 325 MG PO TABS
ORAL_TABLET | ORAL | Status: AC
  Filled 2014-01-28: qty 2

## 2014-01-28 MED ORDER — PALONOSETRON HCL INJECTION 0.25 MG/5ML
INTRAVENOUS | Status: AC
  Filled 2014-01-28: qty 5

## 2014-01-28 MED ORDER — DEXAMETHASONE SODIUM PHOSPHATE 20 MG/5ML IJ SOLN
INTRAMUSCULAR | Status: AC
  Filled 2014-01-28: qty 5

## 2014-01-28 MED ORDER — DEXAMETHASONE SODIUM PHOSPHATE 20 MG/5ML IJ SOLN
12.0000 mg | Freq: Once | INTRAMUSCULAR | Status: AC
  Administered 2014-01-28: 12 mg via INTRAVENOUS

## 2014-01-28 MED ORDER — SODIUM CHLORIDE 0.9 % IV SOLN
580.0000 mg | Freq: Once | INTRAVENOUS | Status: AC
  Administered 2014-01-28: 580 mg via INTRAVENOUS
  Filled 2014-01-28: qty 58

## 2014-01-28 MED ORDER — SODIUM CHLORIDE 0.9 % IJ SOLN
10.0000 mL | INTRAMUSCULAR | Status: DC | PRN
  Administered 2014-01-28: 10 mL
  Filled 2014-01-28: qty 10

## 2014-01-28 MED ORDER — ACETAMINOPHEN 325 MG PO TABS
650.0000 mg | ORAL_TABLET | Freq: Once | ORAL | Status: AC
  Administered 2014-01-28: 650 mg via ORAL

## 2014-01-28 MED ORDER — TRASTUZUMAB CHEMO INJECTION 440 MG
6.0000 mg/kg | Freq: Once | INTRAVENOUS | Status: AC
  Administered 2014-01-28: 462 mg via INTRAVENOUS
  Filled 2014-01-28: qty 22

## 2014-01-28 MED ORDER — SODIUM CHLORIDE 0.9 % IV SOLN
150.0000 mg | Freq: Once | INTRAVENOUS | Status: AC
  Administered 2014-01-28: 150 mg via INTRAVENOUS
  Filled 2014-01-28: qty 5

## 2014-01-28 MED ORDER — SODIUM CHLORIDE 0.9 % IV SOLN
75.0000 mg/m2 | Freq: Once | INTRAVENOUS | Status: AC
  Administered 2014-01-28: 140 mg via INTRAVENOUS
  Filled 2014-01-28: qty 14

## 2014-01-28 MED ORDER — DIPHENHYDRAMINE HCL 25 MG PO CAPS
ORAL_CAPSULE | ORAL | Status: AC
  Filled 2014-01-28: qty 2

## 2014-01-28 MED ORDER — DIPHENHYDRAMINE HCL 25 MG PO CAPS
50.0000 mg | ORAL_CAPSULE | Freq: Once | ORAL | Status: AC
  Administered 2014-01-28: 50 mg via ORAL

## 2014-01-28 MED ORDER — SODIUM CHLORIDE 0.9 % IV SOLN
Freq: Once | INTRAVENOUS | Status: AC
  Administered 2014-01-28: 10:00:00 via INTRAVENOUS

## 2014-01-28 MED ORDER — HEPARIN SOD (PORK) LOCK FLUSH 100 UNIT/ML IV SOLN
500.0000 [IU] | Freq: Once | INTRAVENOUS | Status: AC | PRN
  Administered 2014-01-28: 500 [IU]
  Filled 2014-01-28: qty 5

## 2014-01-28 MED ORDER — PALONOSETRON HCL INJECTION 0.25 MG/5ML
0.2500 mg | Freq: Once | INTRAVENOUS | Status: AC
  Administered 2014-01-28: 0.25 mg via INTRAVENOUS

## 2014-01-28 MED ORDER — SODIUM CHLORIDE 0.9 % IV SOLN
420.0000 mg | Freq: Once | INTRAVENOUS | Status: AC
  Administered 2014-01-28: 420 mg via INTRAVENOUS
  Filled 2014-01-28: qty 14

## 2014-01-28 NOTE — Progress Notes (Signed)
Per Dr. Lona Kettle, okay to use 01/24/14 labs.

## 2014-01-28 NOTE — Patient Instructions (Signed)
[facility] Discharge Instructions for Patients Receiving Chemotherapy  Today you received the following chemotherapy agents :  Herceptin, Perjeta, Taxotere, Carboplatin.  To help prevent nausea and vomiting after your treatment, we encourage you to take your nausea medication as prescribed by your physician.   If you develop nausea and vomiting that is not controlled by your nausea medication, call the clinic.   BELOW ARE SYMPTOMS THAT SHOULD BE REPORTED IMMEDIATELY:  *FEVER GREATER THAN 100.5 F  *CHILLS WITH OR WITHOUT FEVER  NAUSEA AND VOMITING THAT IS NOT CONTROLLED WITH YOUR NAUSEA MEDICATION  *UNUSUAL SHORTNESS OF BREATH  *UNUSUAL BRUISING OR BLEEDING  TENDERNESS IN MOUTH AND THROAT WITH OR WITHOUT PRESENCE OF ULCERS  *URINARY PROBLEMS  *BOWEL PROBLEMS  UNUSUAL RASH Items with * indicate a potential emergency and should be followed up as soon as possible.  Feel free to call the clinic you have any questions or concerns. The clinic phone number is [phone].    

## 2014-01-29 ENCOUNTER — Ambulatory Visit (HOSPITAL_BASED_OUTPATIENT_CLINIC_OR_DEPARTMENT_OTHER): Payer: Managed Care, Other (non HMO)

## 2014-01-29 DIAGNOSIS — C50412 Malignant neoplasm of upper-outer quadrant of left female breast: Secondary | ICD-10-CM

## 2014-01-29 DIAGNOSIS — Z5189 Encounter for other specified aftercare: Secondary | ICD-10-CM

## 2014-01-29 DIAGNOSIS — C50012 Malignant neoplasm of nipple and areola, left female breast: Secondary | ICD-10-CM

## 2014-01-29 MED ORDER — PEGFILGRASTIM INJECTION 6 MG/0.6ML
6.0000 mg | Freq: Once | SUBCUTANEOUS | Status: AC
  Administered 2014-01-29: 6 mg via SUBCUTANEOUS
  Filled 2014-01-29: qty 0.6

## 2014-01-29 NOTE — Patient Instructions (Signed)
Pegfilgrastim injection What is this medicine? PEGFILGRASTIM (peg fil GRA stim) is a long-acting granulocyte colony-stimulating factor that stimulates the growth of neutrophils, a type of white blood cell important in the body's fight against infection. It is used to reduce the incidence of fever and infection in patients with certain types of cancer who are receiving chemotherapy that affects the bone marrow. This medicine may be used for other purposes; ask your health care provider or pharmacist if you have questions. COMMON BRAND NAME(S): Neulasta What should I tell my health care provider before I take this medicine? They need to know if you have any of these conditions: -latex allergy -ongoing radiation therapy -sickle cell disease -skin reactions to acrylic adhesives (On-Body Injector only) -an unusual or allergic reaction to pegfilgrastim, filgrastim, other medicines, foods, dyes, or preservatives -pregnant or trying to get pregnant -breast-feeding How should I use this medicine? This medicine is for injection under the skin. If you get this medicine at home, you will be taught how to prepare and give the pre-filled syringe or how to use the On-body Injector. Refer to the patient Instructions for Use for detailed instructions. Use exactly as directed. Take your medicine at regular intervals. Do not take your medicine more often than directed. It is important that you put your used needles and syringes in a special sharps container. Do not put them in a trash can. If you do not have a sharps container, call your pharmacist or healthcare provider to get one. Talk to your pediatrician regarding the use of this medicine in children. Special care may be needed. Overdosage: If you think you have taken too much of this medicine contact a poison control center or emergency room at once. NOTE: This medicine is only for you. Do not share this medicine with others. What if I miss a dose? It is  important not to miss your dose. Call your doctor or health care professional if you miss your dose. If you miss a dose due to an On-body Injector failure or leakage, a new dose should be administered as soon as possible using a single prefilled syringe for manual use. What may interact with this medicine? Interactions have not been studied. Give your health care provider a list of all the medicines, herbs, non-prescription drugs, or dietary supplements you use. Also tell them if you smoke, drink alcohol, or use illegal drugs. Some items may interact with your medicine. This list may not describe all possible interactions. Give your health care provider a list of all the medicines, herbs, non-prescription drugs, or dietary supplements you use. Also tell them if you smoke, drink alcohol, or use illegal drugs. Some items may interact with your medicine. What should I watch for while using this medicine? You may need blood work done while you are taking this medicine. If you are going to need a MRI, CT scan, or other procedure, tell your doctor that you are using this medicine (On-Body Injector only). What side effects may I notice from receiving this medicine? Side effects that you should report to your doctor or health care professional as soon as possible: -allergic reactions like skin rash, itching or hives, swelling of the face, lips, or tongue -dizziness -fever -pain, redness, or irritation at site where injected -pinpoint red spots on the skin -shortness of breath or breathing problems -stomach or side pain, or pain at the shoulder -swelling -tiredness -trouble passing urine Side effects that usually do not require medical attention (report to your doctor   or health care professional if they continue or are bothersome): -bone pain -muscle pain This list may not describe all possible side effects. Call your doctor for medical advice about side effects. You may report side effects to FDA at  1-800-FDA-1088. Where should I keep my medicine? Keep out of the reach of children. Store pre-filled syringes in a refrigerator between 2 and 8 degrees C (36 and 46 degrees F). Do not freeze. Keep in carton to protect from light. Throw away this medicine if it is left out of the refrigerator for more than 48 hours. Throw away any unused medicine after the expiration date. NOTE: This sheet is a summary. It may not cover all possible information. If you have questions about this medicine, talk to your doctor, pharmacist, or health care provider.  2015, Elsevier/Gold Standard. (2013-06-21 16:14:05)  

## 2014-01-30 ENCOUNTER — Encounter: Payer: Self-pay | Admitting: *Deleted

## 2014-02-11 ENCOUNTER — Telehealth: Payer: Self-pay

## 2014-02-11 NOTE — Telephone Encounter (Signed)
Pt called earlier about constipation. Suggested OTC laxative would be OK if needed. She did have BM this AM after initial call.

## 2014-02-14 ENCOUNTER — Telehealth: Payer: Self-pay | Admitting: Hematology

## 2014-02-14 ENCOUNTER — Other Ambulatory Visit (HOSPITAL_BASED_OUTPATIENT_CLINIC_OR_DEPARTMENT_OTHER): Payer: Managed Care, Other (non HMO)

## 2014-02-14 ENCOUNTER — Ambulatory Visit (HOSPITAL_BASED_OUTPATIENT_CLINIC_OR_DEPARTMENT_OTHER): Payer: Managed Care, Other (non HMO) | Admitting: Hematology

## 2014-02-14 VITALS — BP 136/63 | HR 91 | Temp 98.4°F | Resp 19 | Ht 64.0 in | Wt 179.5 lb

## 2014-02-14 DIAGNOSIS — Z170 Estrogen receptor positive status [ER+]: Secondary | ICD-10-CM

## 2014-02-14 DIAGNOSIS — C50912 Malignant neoplasm of unspecified site of left female breast: Secondary | ICD-10-CM

## 2014-02-14 DIAGNOSIS — C50012 Malignant neoplasm of nipple and areola, left female breast: Secondary | ICD-10-CM

## 2014-02-14 LAB — COMPREHENSIVE METABOLIC PANEL (CC13)
ALK PHOS: 63 U/L (ref 40–150)
ALT: 40 U/L (ref 0–55)
AST: 29 U/L (ref 5–34)
Albumin: 3.3 g/dL — ABNORMAL LOW (ref 3.5–5.0)
Anion Gap: 5 mEq/L (ref 3–11)
BUN: 16.2 mg/dL (ref 7.0–26.0)
CALCIUM: 9 mg/dL (ref 8.4–10.4)
CHLORIDE: 110 meq/L — AB (ref 98–109)
CO2: 29 mEq/L (ref 22–29)
Creatinine: 0.9 mg/dL (ref 0.6–1.1)
Glucose: 96 mg/dl (ref 70–140)
Potassium: 4.4 mEq/L (ref 3.5–5.1)
Sodium: 143 mEq/L (ref 136–145)
Total Bilirubin: 0.42 mg/dL (ref 0.20–1.20)
Total Protein: 5.8 g/dL — ABNORMAL LOW (ref 6.4–8.3)

## 2014-02-14 LAB — CBC WITH DIFFERENTIAL/PLATELET
BASO%: 0.8 % (ref 0.0–2.0)
BASOS ABS: 0.1 10*3/uL (ref 0.0–0.1)
EOS ABS: 0 10*3/uL (ref 0.0–0.5)
EOS%: 0 % (ref 0.0–7.0)
HEMATOCRIT: 32.4 % — AB (ref 34.8–46.6)
HEMOGLOBIN: 10.5 g/dL — AB (ref 11.6–15.9)
LYMPH%: 16.5 % (ref 14.0–49.7)
MCH: 30.8 pg (ref 25.1–34.0)
MCHC: 32.5 g/dL (ref 31.5–36.0)
MCV: 94.8 fL (ref 79.5–101.0)
MONO#: 1 10*3/uL — AB (ref 0.1–0.9)
MONO%: 13.7 % (ref 0.0–14.0)
NEUT%: 69 % (ref 38.4–76.8)
NEUTROS ABS: 5 10*3/uL (ref 1.5–6.5)
PLATELETS: 165 10*3/uL (ref 145–400)
RBC: 3.42 10*6/uL — ABNORMAL LOW (ref 3.70–5.45)
RDW: 19.9 % — ABNORMAL HIGH (ref 11.2–14.5)
WBC: 7.3 10*3/uL (ref 3.9–10.3)
lymph#: 1.2 10*3/uL (ref 0.9–3.3)

## 2014-02-14 NOTE — Telephone Encounter (Signed)
gv and printed appt sched and avs for pt for Dec...sed added tx. °

## 2014-02-14 NOTE — Progress Notes (Signed)
[facility] ONCOLOGY FOLLOW UP NOTE Date of Visit: 02/14/2014  Patient Care Team: [name], MD as PCP - General (Internal Medicine) [name], MD as Consulting Physician (General Surgery)  [place]:   1. 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  2. Patient starting Cycle #5/6 on 02/18/14.  Breast Cancer History:   Rachel [name] 58 y.o. pleasant female who lives in [place], [place] was evaluated on 11/07/2013 with a diagnosis of a HER-2+ breast cancer. She was last seen here on 01/24/2014.   She had been getting annual mammograms since she turned 39. She never had a previous breast problem or biopsy but is known to have dense breasts as per her previous mammograms. Last year mammogram 10/16/2012 was fine. This year she went for her screening mammogram but did not feel a lump. On 10/17/2013 to the Breast center [facility] and was found to have distortion in left breast and spot compression views and ultrasound was recommended.LEFT CC AND MLO VIEWS showed abnormality in left breast. Spot Compression views showed persistent abnormality in left breast.US breast showed a hypoechoic irregular mass with surrounding distortion at 2'0 clock position located 4 cm from nipple measuring 1.1 x 0.9 x 0.7 cm. No left axillary lymphadenopathy noted. [place] was corresponding to mammographic results.  Patient underwent an US guided biopsy with Clip placement on 10/30/2013. Pathology from that (Accession no [id number]) showed an Invasive ductal carcinoma, DCIS and calcifications. Estrogen receptor (ER) 100%+, Progesterone receptor (PR) 42%+ and proliferative marker KI-67 index 53%+. Her-2 receptor status by FISH is positive. So it is a triple positive breast cancer, also known as "LUMINAL B molecular subtype". The grade of tumor invasive and DCIS was reported as grade II. Luminal B breast tumors are usually large in size, higher grade, high  mitotic index, high nodal involvement and up to 30% have p53 mutation. Their survival is also very good though little less than Luminal A subtype. Overall I will call it a favorable biology of tumor.  Interestingly the MRI breast performed on 11/05/2013 showed an enhancing mass which is much larger than the mass seen mammographically or sonographically.  FINDINGS: Breast composition: b. Scattered fibroglandular tissue. Background parenchymal enhancement: Moderate Right breast: No mass or abnormal enhancement. Left breast: In the anterior and middle thirds of the upper and upper outer quadrant of the left breast is a heterogeneous enhancing irregular mass measuring 5 x 2.5 x 5.1 cm in transverse, anterior-posterior and craniocaudal dimensions. Signal void artifact is seen along the lateral aspect of enhancement consistent with the biopsy clip. Along the anterior aspect of the irregular enhancing mass is a more focal 1.5 cm enhancement. Lymph nodes: There is a 9 mm level 1 left axillary lymph node that does not maintain a normal fatty hilum. It is concerning for metastatic involvement. Ancillary findings: None.  IMPRESSION: Irregular enhancing mass measuring 5.1 cm in the upper and upper-outer quadrants of the left breast. T Suspicious level 1 left axillary lymph node concerning for metastatic involving.           It was also recommended to get a biopsy of the anterior aspect of abnormal enhancement in the left breast is recommended to prove the extent of disease. That was done on 11/09/2013 with MR guidance. Using sterile technique, 2% Lidocaine, MRI guidance, and a 9 gauge vacuum assisted device, biopsy was performed of the suspicious area of enhancement in the subareolar left breast using  a lateral approach. At the conclusion of the procedure, a dumbbell tissue marker clip was deployed into the biopsy cavity.   Pathology from that Accession no [id number] showed Ductal cancer and DCIS. ER 100%, PR 67%,  HER 2 by CISH showed amplification RESULT RATIO OF HER2: CEP 17 SIGNALS 2.80, AVERAGE HER2 COPY NUMBER PER CELL 3.50. So it is showing the same phenotype that is Triple positive Breast cancer or LUMINAL B subtype.  A second-look Ultrasound was performed on 11/12/2013, showing normal fibroglandular and fibrofatty tissue. No enlarged or abnormal appearing lymph nodes are seen. No normal lymph nodes are not discretely visualized.IMPRESSION: Negative exam. Lymph node noted on the current breast MRI is not visualized sonographically.  Clinically and based on radiographic data so far it looks like a stage III-A breast cancer (size max 5.1 cm T3, possible+/- nodal involvement N1). Patient started her chemotherapy with TCH-P regimen and got 1st cycle on 11/26/2013. This was folllowed by Neulasta shot given on 11/27/2013.Second cycle given on 12/17/13 and today is her third cycle.       INTERVAL HISTORY:   [name]  tolerated 4th cycle of chemotherapy well. No nausea or vomiting. Mild diarrhea alternating with constipation noted. She did get some backache with use of neulasta but it is getting better. No mouth sores. She did use claritin to minimize neulasta related pain and arthralgias. She denies any cardiac symptoms. She does have some cumulative fatigue from chemotherapy. 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 She is also complaining of frequent urination but no dysuria or fever or chills with it. When she could down the diuretic on her own the frequent urination lessened. She does have some muscle soreness as a result of chemotherapy. No skin rash. The breast lump on her left side on exam is smaller and palpable 1 cm consistent with excellent clinical response. She completed 4 cycles and due to get cycle 4 on 02/18/2014. She has gained some weight from Decadron and also mildly anemic from chemotherapy but not symptomatic. [place]  her husband.   MEDICAL HISTORY:  Past Medical History  Diagnosis Date  . Breast cancer   . Diabetes mellitus without complication   . Hypertension   . Alopecia areata   . Endometriosis   . Hypercholesteremia     SURGICAL HISTORY: Past Surgical History  Procedure Laterality Date  . Abdominal hysterectomy      BSO  . Colonoscopy    . Portacath placement N/A 11/20/2013    Procedure: INSERTION PORT-A-CATH;  Surgeon: [name], MD;  Location: [facility];  Service: General;  Laterality: N/A;   Partial hysterectomy 1995 and total hysterectomy with oophorectomy 1996 for endometriosis.   GYNECOLOGICAL HISTORY:  She menarched at early age of 1 and went to menopause at age 3 when she had a partial hysterectomy (1995) and one ovary removed (for endometriosis), following year she total hysterectomy and other ovary removed as well (1996). She had 1 pregnancy, her first child was born at age 52.  Breast feeding history I did not ask.  She received birth control pills and shots off and on for 20 years.  She was never exposed to fertility medications but did take hormone replacement therapy (Premarin) for about 5 years ending in 2001. She has NO family history of Breast/GYN/GI cancer. Last screening colonoscopy was 2007 and she had a bone density done 06/13/2008 at [facility] office showing a normal BMD  by WHO criteria. Last pap done 1997.  SOCIAL HISTORY: History   Social History  . Marital Status: Married    Spouse Name: Willaim Rayas. Foust    Number of Children: N/A  . Years of Education: N/A   Occupational History  . House wife    Social History Main Topics  . Smoking status: Never Smoker   . Smokeless tobacco: Never Used  . Alcohol Use: No  . Drug Use: No  . Sexual Activity: Yes    Birth Control/ Protection: Post-menopausal   Other Topics Concern  . Not on file   Social History Narrative   Patient lives in [place], [place]. She is a housewife. Both her parents are deceased  father at age 62 and mother at age 34. Patient have 2 brothers and 2 sisters. She has a daughter Templeton who lives in [place], [place]. Patient has good family support. She does not smoke or consume alcohol and no use of recreational drugs.      FAMILY HISTORY: Family History  Problem Relation Age of Onset  . Diabetes Mother   . Coronary artery disease Mother   . Diabetes Father   . Diabetes Sister   . Diabetes Brother   . Diabetes Brother    No history of cancer and her breast cancer does not appear to be hereditary type rather sporadic type.  ALLERGIES:  is allergic to eggs or egg-derived products; sulfa antibiotics; tetanus toxoids; multihance; and penicillins. They asked me about the flu shot but I will not MEDICATIONS:  Current Outpatient Prescriptions  Medication Sig Dispense Refill  . calcium citrate-vitamin D (CITRACAL+D) 315-200 MG-UNIT per tablet Take 1 tablet by mouth daily.    Marland Kitchen dexamethasone (DECADRON) 4 MG tablet Take 1 tablet (4 mg total) by mouth 2 (two) times daily. Start the day before Taxotere. Then again the day after chemo for 3 days. 30 tablet 1  . hydrochlorothiazide (HYDRODIURIL) 25 MG tablet Take 25 mg by mouth daily.    Marland Kitchen HYDROcodone-acetaminophen (NORCO) 5-325 MG per tablet Take 1-2 tablets by mouth every 4 (four) hours as needed for moderate pain or severe pain. 30 tablet 0  . lidocaine-prilocaine (EMLA) cream Apply one application to port-a-cath 1 - 2 hours prior to access. 30 g 1  . Multiple Vitamin (MULTIVITAMIN) tablet Take 1 tablet by mouth daily.    . promethazine (PHENERGAN) 25 MG tablet Take 25 mg by mouth every 6 (six) hours as needed for nausea or vomiting.     No current facility-administered medications for this visit.   Facility-Administered Medications Ordered in Other Visits  Medication Dose Route Frequency Provider Last Rate Last Dose  . sodium chloride 0.9 % injection 10 mL  10 mL Intracatheter PRN Rollyn Scialdone Marla Roe, MD   10  mL at 01/07/14 1648    REVIEW OF SYSTEMS:   Constitutional: Denies fevers, chills or abnormal night sweats Eyes: Denies blurriness of vision, double vision or watery eyes Ears, nose, mouth, throat, and face: Denies mucositis or sore throat Respiratory: Denies cough, dyspnea or wheezes Cardiovascular: Denies palpitation, chest discomfort or lower extremity swelling Gastrointestinal:  Denies nausea, heartburn but did notice change in bowel habits some constipation for which she took prune juice and stool softner and also 2-3 episodes of diarrhea. Skin: Denies abnormal skin rashes Lymphatics: Denies new lymphadenopathy or easy bruising Neurological:Denies numbness, tingling or new weaknesses Behavioral/Psych: Mood is stable, no new changes  All other systems were reviewed with the patient and  are negative.  PHYSICAL EXAMINATION:  ECOG PERFORMANCE STATUS: 1, KPS 100  Filed Vitals:   02/14/14 1143  BP: 136/63  Pulse: 91  Temp: 98.4 F (36.9 C)  Resp: 19   Filed Weights   02/14/14 1143  Weight: 179 lb 8 oz (81.421 kg)    GENERAL:alert, no distress and comfortable SKIN: skin color, texture, turgor are normal, no rashes or significant lesions EYES: normal, conjunctiva are pink and non-injected, sclera clear OROPHARYNX:no exudate, no erythema and lips, buccal mucosa, and tongue normal  NECK: supple, thyroid normal size, non-tender, without nodularity LYMPH:  no palpable lymphadenopathy in the cervical, axillary or inguinal LUNGS: clear to auscultation and percussion with normal breathing effort HEART: regular rate & rhythm and no murmurs and no lower extremity edema ABDOMEN:abdomen soft, non-tender and normal bowel sounds Musculoskeletal:no cyanosis of digits and no clubbing  PSYCH: alert & oriented x 3 with fluent speech NEURO: no focal motor/sensory deficits BREAST EXAM: Right breast fibrocystic feeling, no discrete or palpable mass noted, no skin or nipple changes, no  adenopathy. Left breast underlying mass is very small 1 cm, no nodes felt in axilla. No supraclavicular or cervical LN felt on either side.   LABORATORY DATA:        On 11/15/2013  CA 27-29 was 23, CA 15-3 16, Vit D level 46   RADIOGRAPHIC STUDIES:  I have personally reviewed the radiological images as listed above and agreed with the findings in the report.These included Mammograms, Ultrasound, MRI breast and report on Bone density test.  PATHOLOGY DATA:  Reviewed.            ASSESSMENT:   1. This is a very pleasant 58 years-old Afro-american female from [facility] who presents with a left Invasive Ductal Carcinoma. This was picked on a mammogram and maximal diameter was 1.7 cm on Ultrasound however the MRI breast showed this mass to be large almost 5.1 cm in maximal dimension making it a clinical T3 tumor. Also it showed a suspicious looking solitary lymph node in axilla and that can make it a Clinical Stage III-A tumor. The tumor biology indicates a LUMINAL-B type which means ER/PR/HER2 + tumor. Tumor is strongly ER+100%, PR 42%,KI-67 Index 53% showing a brisk mitotic rate and grade is called grade 2 (but grading as per pathologist cannot be relied upon on initial biopsy). She underwent a repeat biopsy from breast on 11/09/2013 anteriorly and that confirmed the same triple positive breast cancer or Luminal B type. he second look [place] did not show a LN.  2. She can benefit from neoadjuvant chemotherapy esp since her tumor is her-2 positive and neoadjuvant chemotherapy has the potential of downsizing her tumor and making her a lumpectomy candidate. Also we can get an idea about the clinical behavior of the tumor in terms of the pathologic response caused by chemotherapy. Lot of these her2 + tumors respond dramatically and can lead to complete pathological response which then gives her a very good chance for remission and perhaps a cure.  3. The chemo-regimen I started is a  NCCN-endorsed preferred regimen which has very good activity but avoids the cardiotoxicity and leukemogenesis risk associated with using anthracyclines. We use Taxotere and Carboplatin as the backbone chemotherapy and use Trastuzumab (Herceptin) and pertuzumab (Perjeta) as the her-2 directed agents. So all 4 agents are used what we call as TCH-P regimen.  4. The data for this comes from [facility] trial which is a phase 2 neoadjuvant study looking at chemotherapies with  and without anthracyclines. The design of this trial was     5. As noted above the complete PCR rate with using TCH-P was 63.6% which is very impressive and incidence of left ventricular dysfunction was only 2.6%.  6.  The most common grade 3 adverse events for pertuzumab plus doctaxel and herceptin are neutropenia 45%, febrile neutropenia 8.4% and diarrhea about 6%. Other adverse efefcts which can be seen are decreased cardiac function, infusion related reactions, hypersensitivity reactions and anaphylaxis. In [facility] study which led to NCCN endorsement all grade diarrhea was 66% and something we will need to monitor and treat aggressively.   7. Patient tolerated her 4 cycles of chemotherapy well supported by neulasta. We are planning 6 cycles of chemotherapy and her-2 directed therapies to be followed by Breast MRI and surgical evaluation.  8. Her baseline echocardiogram done on 11/15/2013 shows an EF of 50-55%. We will plan another echo post-completion of 6 cycles of chemotherapy.  9. Mild neuropathy grade 1 and fatigue grade 1-2 and weight gain due to chemotherapy.  10. Cycle 5 starts 02/18/2014. Her breast exam shows remarkable improvement and labs are normal.  PLAN:   1. Cycle 5 of TCH-P will be given on 02/18/2014 and I will maintain the doses.  2. She will get neulasta post-chemotherapy.  3. Her breast exam does show 80-85% shrinkage of lump which is a good sign.  4. Continue mouth care and universal precautions.    5. RTC on 03/07/14 for labs and MD visit with Dr Burr Medico and I introduced patient to Dr Burr Medico.   6. Post-chemotherapy we will do another MRI breast and refer back to Dr Barry Dienes for surgery to be followed by Rad/Onc evaluation and then anti-estrogen therapy and Herceptin for 1 year.   Bernadene Bell, MD Medical Hematologist/Oncologist [place] Pager: [phone] Office No: [phone]

## 2014-02-18 ENCOUNTER — Ambulatory Visit (HOSPITAL_BASED_OUTPATIENT_CLINIC_OR_DEPARTMENT_OTHER): Payer: Managed Care, Other (non HMO)

## 2014-02-18 ENCOUNTER — Other Ambulatory Visit: Payer: Self-pay | Admitting: Hematology

## 2014-02-18 DIAGNOSIS — C50412 Malignant neoplasm of upper-outer quadrant of left female breast: Secondary | ICD-10-CM

## 2014-02-18 DIAGNOSIS — C50012 Malignant neoplasm of nipple and areola, left female breast: Secondary | ICD-10-CM

## 2014-02-18 DIAGNOSIS — Z5111 Encounter for antineoplastic chemotherapy: Secondary | ICD-10-CM

## 2014-02-18 DIAGNOSIS — Z5112 Encounter for antineoplastic immunotherapy: Secondary | ICD-10-CM

## 2014-02-18 MED ORDER — PALONOSETRON HCL INJECTION 0.25 MG/5ML
0.2500 mg | Freq: Once | INTRAVENOUS | Status: AC
  Administered 2014-02-18: 0.25 mg via INTRAVENOUS

## 2014-02-18 MED ORDER — SODIUM CHLORIDE 0.9 % IV SOLN
Freq: Once | INTRAVENOUS | Status: AC
  Administered 2014-02-18: 12:00:00 via INTRAVENOUS

## 2014-02-18 MED ORDER — DIPHENHYDRAMINE HCL 25 MG PO CAPS
50.0000 mg | ORAL_CAPSULE | Freq: Once | ORAL | Status: AC
  Administered 2014-02-18: 50 mg via ORAL

## 2014-02-18 MED ORDER — SODIUM CHLORIDE 0.9 % IV SOLN
650.0000 mg | Freq: Once | INTRAVENOUS | Status: AC
  Administered 2014-02-18: 650 mg via INTRAVENOUS
  Filled 2014-02-18: qty 65

## 2014-02-18 MED ORDER — PALONOSETRON HCL INJECTION 0.25 MG/5ML
INTRAVENOUS | Status: AC
Start: 2014-02-18 — End: 2014-02-18
  Filled 2014-02-18: qty 5

## 2014-02-18 MED ORDER — TRASTUZUMAB CHEMO INJECTION 440 MG
6.0000 mg/kg | Freq: Once | INTRAVENOUS | Status: AC
  Administered 2014-02-18: 462 mg via INTRAVENOUS
  Filled 2014-02-18: qty 22

## 2014-02-18 MED ORDER — DIPHENHYDRAMINE HCL 25 MG PO CAPS
ORAL_CAPSULE | ORAL | Status: AC
  Filled 2014-02-18: qty 2

## 2014-02-18 MED ORDER — HEPARIN SOD (PORK) LOCK FLUSH 100 UNIT/ML IV SOLN
500.0000 [IU] | Freq: Once | INTRAVENOUS | Status: AC | PRN
  Administered 2014-02-18: 500 [IU]
  Filled 2014-02-18: qty 5

## 2014-02-18 MED ORDER — FOSAPREPITANT DIMEGLUMINE INJECTION 150 MG
150.0000 mg | Freq: Once | INTRAVENOUS | Status: AC
  Administered 2014-02-18: 150 mg via INTRAVENOUS
  Filled 2014-02-18: qty 5

## 2014-02-18 MED ORDER — SODIUM CHLORIDE 0.9 % IV SOLN
75.0000 mg/m2 | Freq: Once | INTRAVENOUS | Status: AC
  Administered 2014-02-18: 140 mg via INTRAVENOUS
  Filled 2014-02-18: qty 14

## 2014-02-18 MED ORDER — SODIUM CHLORIDE 0.9 % IV SOLN
420.0000 mg | Freq: Once | INTRAVENOUS | Status: AC
  Administered 2014-02-18: 420 mg via INTRAVENOUS
  Filled 2014-02-18: qty 14

## 2014-02-18 MED ORDER — DEXAMETHASONE SODIUM PHOSPHATE 20 MG/5ML IJ SOLN
12.0000 mg | Freq: Once | INTRAMUSCULAR | Status: AC
  Administered 2014-02-18: 12 mg via INTRAVENOUS

## 2014-02-18 MED ORDER — ACETAMINOPHEN 325 MG PO TABS
ORAL_TABLET | ORAL | Status: AC
  Filled 2014-02-18: qty 2

## 2014-02-18 MED ORDER — DEXAMETHASONE SODIUM PHOSPHATE 20 MG/5ML IJ SOLN
INTRAMUSCULAR | Status: AC
Start: 2014-02-18 — End: 2014-02-18
  Filled 2014-02-18: qty 5

## 2014-02-18 MED ORDER — ACETAMINOPHEN 325 MG PO TABS
650.0000 mg | ORAL_TABLET | Freq: Once | ORAL | Status: AC
  Administered 2014-02-18: 650 mg via ORAL

## 2014-02-18 MED ORDER — SODIUM CHLORIDE 0.9 % IV SOLN: Freq: Once | INTRAVENOUS | Status: DC

## 2014-02-18 MED ORDER — SODIUM CHLORIDE 0.9 % IJ SOLN
10.0000 mL | INTRAMUSCULAR | Status: DC | PRN
  Administered 2014-02-18: 10 mL
  Filled 2014-02-18: qty 10

## 2014-02-18 NOTE — Patient Instructions (Signed)
[facility] Discharge Instructions for Patients Receiving Chemotherapy  Today you received the following chemotherapy agents :  Herceptin, Perjeta, Taxotere, Carboplatin.  To help prevent nausea and vomiting after your treatment, we encourage you to take your nausea medication as prescribed by your physician.   If you develop nausea and vomiting that is not controlled by your nausea medication, call the clinic.   BELOW ARE SYMPTOMS THAT SHOULD BE REPORTED IMMEDIATELY:  *FEVER GREATER THAN 100.5 F  *CHILLS WITH OR WITHOUT FEVER  NAUSEA AND VOMITING THAT IS NOT CONTROLLED WITH YOUR NAUSEA MEDICATION  *UNUSUAL SHORTNESS OF BREATH  *UNUSUAL BRUISING OR BLEEDING  TENDERNESS IN MOUTH AND THROAT WITH OR WITHOUT PRESENCE OF ULCERS  *URINARY PROBLEMS  *BOWEL PROBLEMS  UNUSUAL RASH Items with * indicate a potential emergency and should be followed up as soon as possible.  Feel free to call the clinic you have any questions or concerns. The clinic phone number is [phone].    

## 2014-02-19 ENCOUNTER — Ambulatory Visit: Payer: Managed Care, Other (non HMO)

## 2014-02-19 ENCOUNTER — Other Ambulatory Visit: Payer: Self-pay | Admitting: Hematology

## 2014-02-19 DIAGNOSIS — C50412 Malignant neoplasm of upper-outer quadrant of left female breast: Secondary | ICD-10-CM

## 2014-02-19 MED ORDER — PEGFILGRASTIM INJECTION 6 MG/0.6ML ~~LOC~~
6.0000 mg | PREFILLED_SYRINGE | Freq: Once | SUBCUTANEOUS | Status: DC
  Filled 2014-02-19: qty 0.6

## 2014-02-19 NOTE — Patient Instructions (Signed)
Pegfilgrastim injection What is this medicine? PEGFILGRASTIM (peg fil GRA stim) is a long-acting granulocyte colony-stimulating factor that stimulates the growth of neutrophils, a type of white blood cell important in the body's fight against infection. It is used to reduce the incidence of fever and infection in patients with certain types of cancer who are receiving chemotherapy that affects the bone marrow. This medicine may be used for other purposes; ask your health care provider or pharmacist if you have questions. COMMON BRAND NAME(S): Neulasta What should I tell my health care provider before I take this medicine? They need to know if you have any of these conditions: -latex allergy -ongoing radiation therapy -sickle cell disease -skin reactions to acrylic adhesives (On-Body Injector only) -an unusual or allergic reaction to pegfilgrastim, filgrastim, other medicines, foods, dyes, or preservatives -pregnant or trying to get pregnant -breast-feeding How should I use this medicine? This medicine is for injection under the skin. If you get this medicine at home, you will be taught how to prepare and give the pre-filled syringe or how to use the On-body Injector. Refer to the patient Instructions for Use for detailed instructions. Use exactly as directed. Take your medicine at regular intervals. Do not take your medicine more often than directed. It is important that you put your used needles and syringes in a special sharps container. Do not put them in a trash can. If you do not have a sharps container, call your pharmacist or healthcare provider to get one. Talk to your pediatrician regarding the use of this medicine in children. Special care may be needed. Overdosage: If you think you have taken too much of this medicine contact a poison control center or emergency room at once. NOTE: This medicine is only for you. Do not share this medicine with others. What if I miss a dose? It is  important not to miss your dose. Call your doctor or health care professional if you miss your dose. If you miss a dose due to an On-body Injector failure or leakage, a new dose should be administered as soon as possible using a single prefilled syringe for manual use. What may interact with this medicine? Interactions have not been studied. Give your health care provider a list of all the medicines, herbs, non-prescription drugs, or dietary supplements you use. Also tell them if you smoke, drink alcohol, or use illegal drugs. Some items may interact with your medicine. This list may not describe all possible interactions. Give your health care provider a list of all the medicines, herbs, non-prescription drugs, or dietary supplements you use. Also tell them if you smoke, drink alcohol, or use illegal drugs. Some items may interact with your medicine. What should I watch for while using this medicine? You may need blood work done while you are taking this medicine. If you are going to need a MRI, CT scan, or other procedure, tell your doctor that you are using this medicine (On-Body Injector only). What side effects may I notice from receiving this medicine? Side effects that you should report to your doctor or health care professional as soon as possible: -allergic reactions like skin rash, itching or hives, swelling of the face, lips, or tongue -dizziness -fever -pain, redness, or irritation at site where injected -pinpoint red spots on the skin -shortness of breath or breathing problems -stomach or side pain, or pain at the shoulder -swelling -tiredness -trouble passing urine Side effects that usually do not require medical attention (report to your doctor   or health care professional if they continue or are bothersome): -bone pain -muscle pain This list may not describe all possible side effects. Call your doctor for medical advice about side effects. You may report side effects to FDA at  1-800-FDA-1088. Where should I keep my medicine? Keep out of the reach of children. Store pre-filled syringes in a refrigerator between 2 and 8 degrees C (36 and 46 degrees F). Do not freeze. Keep in carton to protect from light. Throw away this medicine if it is left out of the refrigerator for more than 48 hours. Throw away any unused medicine after the expiration date. NOTE: This sheet is a summary. It may not cover all possible information. If you have questions about this medicine, talk to your doctor, pharmacist, or health care provider.  2015, Elsevier/Gold Standard. (2013-06-21 16:14:05)  

## 2014-03-11 ENCOUNTER — Other Ambulatory Visit: Payer: Self-pay | Admitting: Hematology

## 2014-03-11 ENCOUNTER — Telehealth: Payer: Self-pay | Admitting: Hematology

## 2014-03-11 ENCOUNTER — Ambulatory Visit (HOSPITAL_BASED_OUTPATIENT_CLINIC_OR_DEPARTMENT_OTHER): Payer: Managed Care, Other (non HMO)

## 2014-03-11 ENCOUNTER — Ambulatory Visit (HOSPITAL_BASED_OUTPATIENT_CLINIC_OR_DEPARTMENT_OTHER): Payer: Managed Care, Other (non HMO) | Admitting: Hematology

## 2014-03-11 ENCOUNTER — Other Ambulatory Visit (HOSPITAL_BASED_OUTPATIENT_CLINIC_OR_DEPARTMENT_OTHER): Payer: Managed Care, Other (non HMO)

## 2014-03-11 ENCOUNTER — Encounter: Payer: Self-pay | Admitting: Hematology

## 2014-03-11 ENCOUNTER — Telehealth: Payer: Self-pay | Admitting: *Deleted

## 2014-03-11 VITALS — BP 139/77 | HR 95 | Temp 98.2°F | Resp 18 | Ht 64.0 in | Wt 179.1 lb

## 2014-03-11 DIAGNOSIS — C50112 Malignant neoplasm of central portion of left female breast: Secondary | ICD-10-CM

## 2014-03-11 DIAGNOSIS — G629 Polyneuropathy, unspecified: Secondary | ICD-10-CM

## 2014-03-11 DIAGNOSIS — D6481 Anemia due to antineoplastic chemotherapy: Secondary | ICD-10-CM

## 2014-03-11 DIAGNOSIS — Z170 Estrogen receptor positive status [ER+]: Secondary | ICD-10-CM

## 2014-03-11 DIAGNOSIS — C50412 Malignant neoplasm of upper-outer quadrant of left female breast: Secondary | ICD-10-CM

## 2014-03-11 DIAGNOSIS — Z5112 Encounter for antineoplastic immunotherapy: Secondary | ICD-10-CM

## 2014-03-11 DIAGNOSIS — C50912 Malignant neoplasm of unspecified site of left female breast: Secondary | ICD-10-CM

## 2014-03-11 DIAGNOSIS — Z5111 Encounter for antineoplastic chemotherapy: Secondary | ICD-10-CM

## 2014-03-11 LAB — COMPREHENSIVE METABOLIC PANEL (CC13)
ALT: 52 U/L (ref 0–55)
ANION GAP: 9 meq/L (ref 3–11)
AST: 29 U/L (ref 5–34)
Albumin: 3.6 g/dL (ref 3.5–5.0)
Alkaline Phosphatase: 64 U/L (ref 40–150)
BILIRUBIN TOTAL: 0.43 mg/dL (ref 0.20–1.20)
BUN: 18.4 mg/dL (ref 7.0–26.0)
CALCIUM: 9.7 mg/dL (ref 8.4–10.4)
CHLORIDE: 105 meq/L (ref 98–109)
CO2: 27 meq/L (ref 22–29)
Creatinine: 0.8 mg/dL (ref 0.6–1.1)
GLUCOSE: 106 mg/dL (ref 70–140)
Potassium: 4 mEq/L (ref 3.5–5.1)
Sodium: 141 mEq/L (ref 136–145)
Total Protein: 6.3 g/dL — ABNORMAL LOW (ref 6.4–8.3)

## 2014-03-11 LAB — CBC WITH DIFFERENTIAL/PLATELET
BASO%: 0.3 % (ref 0.0–2.0)
Basophils Absolute: 0 10*3/uL (ref 0.0–0.1)
EOS ABS: 0 10*3/uL (ref 0.0–0.5)
EOS%: 0 % (ref 0.0–7.0)
HEMATOCRIT: 34.2 % — AB (ref 34.8–46.6)
HGB: 11 g/dL — ABNORMAL LOW (ref 11.6–15.9)
LYMPH#: 0.7 10*3/uL — AB (ref 0.9–3.3)
LYMPH%: 9 % — AB (ref 14.0–49.7)
MCH: 31.1 pg (ref 25.1–34.0)
MCHC: 32.3 g/dL (ref 31.5–36.0)
MCV: 96.5 fL (ref 79.5–101.0)
MONO#: 0.4 10*3/uL (ref 0.1–0.9)
MONO%: 5.6 % (ref 0.0–14.0)
NEUT%: 85.1 % — ABNORMAL HIGH (ref 38.4–76.8)
NEUTROS ABS: 6.7 10*3/uL — AB (ref 1.5–6.5)
PLATELETS: 197 10*3/uL (ref 145–400)
RBC: 3.54 10*6/uL — ABNORMAL LOW (ref 3.70–5.45)
RDW: 19.4 % — ABNORMAL HIGH (ref 11.2–14.5)
WBC: 7.9 10*3/uL (ref 3.9–10.3)

## 2014-03-11 MED ORDER — DIPHENHYDRAMINE HCL 25 MG PO CAPS
50.0000 mg | ORAL_CAPSULE | Freq: Once | ORAL | Status: AC
  Administered 2014-03-11: 50 mg via ORAL

## 2014-03-11 MED ORDER — DEXAMETHASONE SODIUM PHOSPHATE 20 MG/5ML IJ SOLN
12.0000 mg | Freq: Once | INTRAMUSCULAR | Status: AC
  Administered 2014-03-11: 12 mg via INTRAVENOUS

## 2014-03-11 MED ORDER — DIPHENHYDRAMINE HCL 25 MG PO CAPS
ORAL_CAPSULE | ORAL | Status: AC
  Filled 2014-03-11: qty 2

## 2014-03-11 MED ORDER — HEPARIN SOD (PORK) LOCK FLUSH 100 UNIT/ML IV SOLN
500.0000 [IU] | Freq: Once | INTRAVENOUS | Status: AC | PRN
  Administered 2014-03-11: 500 [IU]
  Filled 2014-03-11: qty 5

## 2014-03-11 MED ORDER — ACETAMINOPHEN 325 MG PO TABS
ORAL_TABLET | ORAL | Status: AC
  Filled 2014-03-11: qty 2

## 2014-03-11 MED ORDER — PALONOSETRON HCL INJECTION 0.25 MG/5ML
0.2500 mg | Freq: Once | INTRAVENOUS | Status: AC
  Administered 2014-03-11: 0.25 mg via INTRAVENOUS

## 2014-03-11 MED ORDER — DOCETAXEL CHEMO INJECTION 160 MG/16ML
75.0000 mg/m2 | Freq: Once | INTRAVENOUS | Status: AC
  Administered 2014-03-11: 140 mg via INTRAVENOUS
  Filled 2014-03-11: qty 14

## 2014-03-11 MED ORDER — PALONOSETRON HCL INJECTION 0.25 MG/5ML
INTRAVENOUS | Status: AC
  Filled 2014-03-11: qty 5

## 2014-03-11 MED ORDER — SODIUM CHLORIDE 0.9 % IV SOLN
420.0000 mg | Freq: Once | INTRAVENOUS | Status: AC
  Administered 2014-03-11: 420 mg via INTRAVENOUS
  Filled 2014-03-11: qty 14

## 2014-03-11 MED ORDER — SODIUM CHLORIDE 0.9 % IV SOLN
Freq: Once | INTRAVENOUS | Status: AC
  Administered 2014-03-11: 13:00:00 via INTRAVENOUS

## 2014-03-11 MED ORDER — SODIUM CHLORIDE 0.9 % IJ SOLN
10.0000 mL | INTRAMUSCULAR | Status: DC | PRN
  Administered 2014-03-11: 10 mL
  Filled 2014-03-11: qty 10

## 2014-03-11 MED ORDER — SODIUM CHLORIDE 0.9 % IV SOLN
150.0000 mg | Freq: Once | INTRAVENOUS | Status: AC
  Administered 2014-03-11: 150 mg via INTRAVENOUS
  Filled 2014-03-11: qty 5

## 2014-03-11 MED ORDER — ACETAMINOPHEN 325 MG PO TABS
650.0000 mg | ORAL_TABLET | Freq: Once | ORAL | Status: AC
  Administered 2014-03-11: 650 mg via ORAL

## 2014-03-11 MED ORDER — SODIUM CHLORIDE 0.9 % IV SOLN
730.0000 mg | Freq: Once | INTRAVENOUS | Status: AC
  Administered 2014-03-11: 730 mg via INTRAVENOUS
  Filled 2014-03-11: qty 73

## 2014-03-11 MED ORDER — DEXAMETHASONE SODIUM PHOSPHATE 20 MG/5ML IJ SOLN
INTRAMUSCULAR | Status: AC
  Filled 2014-03-11: qty 5

## 2014-03-11 MED ORDER — TRASTUZUMAB CHEMO INJECTION 440 MG
6.0000 mg/kg | Freq: Once | INTRAVENOUS | Status: AC
  Administered 2014-03-11: 462 mg via INTRAVENOUS
  Filled 2014-03-11: qty 22

## 2014-03-11 NOTE — Progress Notes (Signed)
[place] OFFICE PROGRESS NOTE  Patient Care Team: [name], MD as PCP - General (Internal Medicine) [name], MD as Consulting Physician (General Surgery)  SUMMARY OF ONCOLOGIC HISTORY:   Breast cancer of upper-outer quadrant of left female breast, T3N0M0 stage IIB    10/17/2013 Mammogram There is persistent distortion in the left upper outer quadrant, corresponding to the screening mammographic finding.    10/26/2013 Breast US Ultrasound is performed, showing a hypoechoic irregular mass with surrounding distortion in the left breast 2 o'clock location 4 cm from the nipple measuring 1.1 x 0.9 x 0.7 cm. This corresponds to the mammographic finding. 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 - CALCIFICATIONS IDENTIFIED   10/30/2013 Receptors her2 A sample was sent to [facility] for HER-2 testing by FISH. The results are as follows: Positive.Estrogen Receptor: 100%, POSITIVE, STRONG STAINING INTENSITY Progesterone Receptor: 42%, POSITIVE, MODERATE STAINING INTENSITY Proliferation Marker Ki67: 53%   11/05/2013 Breast MRI Irregular enhancing mass measuring 5.1 cm in the upper and upper-outer quadrants of the left breast. The MR enhancing mass is much larger than the mass seen mammographically or sonographically.    11/05/2013 Breast MRI Suspicious level 1 left axillary lymph node concerning for metastatic involving, but [place] negative, no node biopsy done    11/07/2013 Initial Diagnosis Breast cancer of upper-outer quadrant of left female breast   11/26/2013 -  Chemotherapy Cycle 1 of chemotherapy with TCH-P (Taxotere, Carbolatin, Herceptin and Perjeta followed by Neulasta   12/17/2013 -  Chemotherapy Cyle 2 of TCH-P given today. Clinically very good response in tumor.   01/07/2014 -  Chemotherapy Cycle 3 of TCH-P given today. clinical improvement  in breast cancer noted almost 80%.   01/28/2014 -  Chemotherapy Cycle 4 of THC-P given.    CURRENT THERAPY: Neoadjuvant chemo TCH-P, started on 12/17/2013, planned for 6 cycles   INTERVAL HISTORY: She returns for follow up and last cycle adjuvant chemo. She has been doing well overall. She was previously under Dr. San Jetty care, who has left the practice. She noticed bilateral ankle swelling at evening lately, slightly worse peripheral neuropathy (numberness, no weakness), (+) tearing, but manageable. No feer or chills. She has good appetite and weight is stable.   REVIEW OF SYSTEMS:   Constitutional: Denies fevers, chills or abnormal weight loss Eyes: Denies blurriness of vision Ears, nose, mouth, throat, and face: Denies mucositis or sore throat Respiratory: Denies cough, dyspnea or wheezes Cardiovascular: Denies palpitation, chest discomfort or lower extremity swelling Gastrointestinal:  Denies nausea, heartburn or change in bowel habits Skin: Denies abnormal skin rashes Lymphatics: Denies new lymphadenopathy or easy bruising Neurological:Denies numbness, tingling or new weaknesses Behavioral/Psych: Mood is stable, no new changes  All other systems were reviewed with the patient and are negative except those in history   MEDICAL HISTORY:  Past Medical History  Diagnosis Date  . Breast cancer   . Diabetes mellitus without complication   . Hypertension   . Alopecia areata   . Endometriosis   . Hypercholesteremia     SURGICAL HISTORY: Past Surgical History  Procedure Laterality Date  . Abdominal hysterectomy      BSO  . Colonoscopy    . Portacath placement N/A 11/20/2013    Procedure: INSERTION PORT-A-CATH;  Surgeon: [name], MD;  Location: [facility];  Service: General;  Laterality: N/A;    I  have reviewed the social history and family history with the patient and they are unchanged from previous note.  ALLERGIES:  is allergic to eggs or egg-derived products; sulfa  antibiotics; tetanus toxoids; multihance; and penicillins.  MEDICATIONS:  Current Outpatient Prescriptions  Medication Sig Dispense Refill  . calcium citrate-vitamin D (CITRACAL+D) 315-200 MG-UNIT per tablet Take 1 tablet by mouth daily.    Marland Kitchen dexamethasone (DECADRON) 4 MG tablet Take 1 tablet (4 mg total) by mouth 2 (two) times daily. Start the day before Taxotere. Then again the day after chemo for 3 days. 30 tablet 1  . hydrochlorothiazide (HYDRODIURIL) 25 MG tablet Take 25 mg by mouth daily.    Marland Kitchen HYDROcodone-acetaminophen (NORCO) 5-325 MG per tablet Take 1-2 tablets by mouth every 4 (four) hours as needed for moderate pain or severe pain. 30 tablet 0  . lidocaine-prilocaine (EMLA) cream Apply one application to port-a-cath 1 - 2 hours prior to access. 30 g 1  . Multiple Vitamin (MULTIVITAMIN) tablet Take 1 tablet by mouth daily.    . promethazine (PHENERGAN) 25 MG tablet Take 25 mg by mouth every 6 (six) hours as needed for nausea or vomiting.     No current facility-administered medications for this visit.    PHYSICAL EXAMINATION: ECOG PERFORMANCE STATUS: 1 - Symptomatic but completely ambulatory  Filed Vitals:   03/11/14 1104  BP: 139/77  Pulse: 95  Temp: 98.2 F (36.8 C)  Resp: 18   Filed Weights   03/11/14 1104  Weight: 179 lb 1.6 oz (81.239 kg)    GENERAL:alert, no distress and comfortable SKIN: skin color, texture, turgor are normal, no rashes or significant lesions EYES: normal, Conjunctiva are pink and non-injected, sclera clear OROPHARYNX:no exudate, no erythema and lips, buccal mucosa, and tongue normal  NECK: supple, thyroid normal size, non-tender, without nodularity LYMPH:  no palpable lymphadenopathy in the cervical, axillary or inguinal LUNGS: clear to auscultation and percussion with normal breathing effort HEART: regular rate & rhythm and no murmurs and no lower extremity edema ABDOMEN:abdomen soft, non-tender and normal bowel sounds Musculoskeletal:no  cyanosis of digits and no clubbing  NEURO: alert & oriented x 3 with fluent speech, no focal motor/sensory deficits Breasts: Breast inspection showed them to be symmetrical with no skin change or  nipple discharge. Palpation of the breasts and axilla revealed no obvious mass that I could appreciate, except some fullness at the upper out quadrant of left breast..   LABORATORY DATA:  I have reviewed the data as listed CBC Latest Ref Rng 03/11/2014 02/14/2014 01/24/2014  WBC 3.9 - 10.3 10e3/uL 7.9 7.3 8.4  Hemoglobin 11.6 - 15.9 g/dL 11.0(L) 10.5(L) 10.2(L)  Hematocrit 34.8 - 46.6 % 34.2(L) 32.4(L) 31.4(L)  Platelets 145 - 400 10e3/uL 197 165 171     CMP Latest Ref Rng 03/11/2014 02/14/2014 01/24/2014  Glucose 70 - 140 mg/dl 106 96 97  BUN 7.0 - 26.0 mg/dL 18.4 16.2 16.3  Creatinine 0.6 - 1.1 mg/dL 0.8 0.9 1.0  Sodium 136 - 145 mEq/L 141 143 146(H)  Potassium 3.5 - 5.1 mEq/L 4.0 4.4 3.5  CO2 22 - 29 mEq/L 27 29 31(H)  Calcium 8.4 - 10.4 mg/dL 9.7 9.0 9.4  Total Protein 6.4 - 8.3 g/dL 6.3(L) 5.8(L) 5.9(L)  Total Bilirubin 0.20 - 1.20 mg/dL 0.43 0.42 0.48  Alkaline Phos 40 - 150 U/L 64 63 65  AST 5 - 34 U/L _0 ALT 0 - 55 U/L 52 40 34      RADIOGRAPHIC STUDIES:  I have personally reviewed the radiological images as listed and agreed with the findings in the report. No results found.   ASSESSMENT & PLAN:  This is a very pleasant 58 years-old Afro-american female from [facility] who presents with a left Invasive Ductal Carcinoma. This was picked on a mammogram and maximal diameter was 1.7 cm on Ultrasound however the MRI breast showed this mass to be large almost 5.1 cm in maximal dimension making it a clinical T3 tumor. Also it showed a suspicious looking solitary lymph node in axilla but [place] was negative, not biopsied.  Clinical T3N0M0 Stage IIB.  The tumor biology indicates a LUMINAL-B type which means ER/PR/HER2 + tumor. Tumor is strongly ER+100%, PR 42%,KI-67 Index 53% showing a  brisk mitotic rate and grade is called grade 2 (but grading as per pathologist cannot be relied upon on initial biopsy).   1. 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 -Based on her clinical exam, she has had excellent response to neoadjuvant chemotherapy. I'll order a bilateral breast MRI with contrast to further evaluate her clinical response. -We'll refer her back to her surgeon Dr. Barry Dienes to discuss her surgery.  -I'll continue her Herceptin every 3 weeks to complete a total of one year treatment. -She will start hormonal therapy after she completes surgery and adjuvant radiation (if she needs) -We'll monitor her medical function by repeating echo every 3 months. She is due, was scheduled as soon as possible.  2. Anemia, secondary to chemotherapy -Her anemia stable, no need for blood transfusion. I anticipate her anemia will improve after we complete her neoadjuvant chemotherapy.  3. Grade 1 peripheral neuropathy, and a mild intermittent ankle swollen. Likely secondary to chemotherapy (docetaxel) -Continue observation  Follow-up: I'll see her back in 4 weeks and start her on Herceptin maintenance therapy.    Orders Placed This Encounter  Procedures  . MR Breast Bilateral W Contrast    Standing Status: Future     Number of Occurrences:      Standing Expiration Date: 03/11/2015    Order Specific Question:  Reason for Exam (SYMPTOM  OR DIAGNOSIS REQUIRED)    Answer:  restaging    Order Specific Question:  Preferred imaging location?    Answer:  [facility]    Order Specific Question:  Does the patient have a pacemaker or implanted devices?    Answer:  No    Order Specific Question:  What is the patient's sedation requirement?    Answer:  No Sedation  . CBC with  Differential    With each visit     Standing Status: Standing     Number of Occurrences: 20     Standing Expiration Date: 03/12/2015  . Comprehensive metabolic panel (Cmet) - [facility]    With each visit     Standing Status: Standing     Number of Occurrences: 20     Standing Expiration Date: 03/12/2015  . 2D Echocardiogram without contrast    Standing Status: Future     Number of Occurrences:      Standing Expiration Date: 03/11/2015    Scheduling Instructions:     Pt on anti-her2 therapy for breast cancer, follow up cardiac function    Order Specific Question:  Type of Echo    Answer:  Limited    Order Specific Question:  Reason for Exam    Answer:  Evaluate EF    Order Specific Question:  Where should this test be performed    Answer:  [facility]   All questions were answered. The patient knows to call the clinic with any problems, questions or concerns. No barriers to learning was detected. I spent 25 minutes counseling the patient face to face. The total time spent in the appointment was 30 minutes and more than 50% was on counseling and review of test results     Truitt Merle, MD 03/11/2014 10:45 PM

## 2014-03-11 NOTE — Patient Instructions (Signed)
[facility] Discharge Instructions for Patients Receiving Chemotherapy  Today you received the following chemotherapy agents Pjeta, Herceptin, Taxotere, Carboplatin  To help prevent nausea and vomiting after your treatment, we encourage you to take your nausea medication as prescribed.   If you develop nausea and vomiting that is not controlled by your nausea medication, call the clinic.   BELOW ARE SYMPTOMS THAT SHOULD BE REPORTED IMMEDIATELY:  *FEVER GREATER THAN 100.5 F  *CHILLS WITH OR WITHOUT FEVER  NAUSEA AND VOMITING THAT IS NOT CONTROLLED WITH YOUR NAUSEA MEDICATION  *UNUSUAL SHORTNESS OF BREATH  *UNUSUAL BRUISING OR BLEEDING  TENDERNESS IN MOUTH AND THROAT WITH OR WITHOUT PRESENCE OF ULCERS  *URINARY PROBLEMS  *BOWEL PROBLEMS  UNUSUAL RASH Items with * indicate a potential emergency and should be followed up as soon as possible.  Feel free to call the clinic you have any questions or concerns. The clinic phone number is [phone].

## 2014-03-11 NOTE — Telephone Encounter (Signed)
Gave avs & cal for Jan/Feb. °

## 2014-03-11 NOTE — Telephone Encounter (Signed)
Message left with [facility] to check on xeloda coverage for pt who will need in a few weeks.

## 2014-03-12 ENCOUNTER — Telehealth: Payer: Self-pay | Admitting: Hematology

## 2014-03-12 ENCOUNTER — Ambulatory Visit (HOSPITAL_BASED_OUTPATIENT_CLINIC_OR_DEPARTMENT_OTHER): Payer: Managed Care, Other (non HMO)

## 2014-03-12 ENCOUNTER — Telehealth: Payer: Self-pay | Admitting: *Deleted

## 2014-03-12 DIAGNOSIS — C50412 Malignant neoplasm of upper-outer quadrant of left female breast: Secondary | ICD-10-CM

## 2014-03-12 DIAGNOSIS — Z5189 Encounter for other specified aftercare: Secondary | ICD-10-CM

## 2014-03-12 DIAGNOSIS — C50112 Malignant neoplasm of central portion of left female breast: Secondary | ICD-10-CM

## 2014-03-12 MED ORDER — PEGFILGRASTIM INJECTION 6 MG/0.6ML ~~LOC~~
6.0000 mg | PREFILLED_SYRINGE | Freq: Once | SUBCUTANEOUS | Status: AC
  Administered 2014-03-12: 6 mg via SUBCUTANEOUS
  Filled 2014-03-12: qty 0.6

## 2014-03-12 NOTE — Patient Instructions (Signed)
Pegfilgrastim injection What is this medicine? PEGFILGRASTIM (peg fil GRA stim) is a long-acting granulocyte colony-stimulating factor that stimulates the growth of neutrophils, a type of white blood cell important in the body's fight against infection. It is used to reduce the incidence of fever and infection in patients with certain types of cancer who are receiving chemotherapy that affects the bone marrow. This medicine may be used for other purposes; ask your health care provider or pharmacist if you have questions. COMMON BRAND NAME(S): Neulasta What should I tell my health care provider before I take this medicine? They need to know if you have any of these conditions: -latex allergy -ongoing radiation therapy -sickle cell disease -skin reactions to acrylic adhesives (On-Body Injector only) -an unusual or allergic reaction to pegfilgrastim, filgrastim, other medicines, foods, dyes, or preservatives -pregnant or trying to get pregnant -breast-feeding How should I use this medicine? This medicine is for injection under the skin. If you get this medicine at home, you will be taught how to prepare and give the pre-filled syringe or how to use the On-body Injector. Refer to the patient Instructions for Use for detailed instructions. Use exactly as directed. Take your medicine at regular intervals. Do not take your medicine more often than directed. It is important that you put your used needles and syringes in a special sharps container. Do not put them in a trash can. If you do not have a sharps container, call your pharmacist or healthcare provider to get one. Talk to your pediatrician regarding the use of this medicine in children. Special care may be needed. Overdosage: If you think you have taken too much of this medicine contact a poison control center or emergency room at once. NOTE: This medicine is only for you. Do not share this medicine with others. What if I miss a dose? It is  important not to miss your dose. Call your doctor or health care professional if you miss your dose. If you miss a dose due to an On-body Injector failure or leakage, a new dose should be administered as soon as possible using a single prefilled syringe for manual use. What may interact with this medicine? Interactions have not been studied. Give your health care provider a list of all the medicines, herbs, non-prescription drugs, or dietary supplements you use. Also tell them if you smoke, drink alcohol, or use illegal drugs. Some items may interact with your medicine. This list may not describe all possible interactions. Give your health care provider a list of all the medicines, herbs, non-prescription drugs, or dietary supplements you use. Also tell them if you smoke, drink alcohol, or use illegal drugs. Some items may interact with your medicine. What should I watch for while using this medicine? You may need blood work done while you are taking this medicine. If you are going to need a MRI, CT scan, or other procedure, tell your doctor that you are using this medicine (On-Body Injector only). What side effects may I notice from receiving this medicine? Side effects that you should report to your doctor or health care professional as soon as possible: -allergic reactions like skin rash, itching or hives, swelling of the face, lips, or tongue -dizziness -fever -pain, redness, or irritation at site where injected -pinpoint red spots on the skin -shortness of breath or breathing problems -stomach or side pain, or pain at the shoulder -swelling -tiredness -trouble passing urine Side effects that usually do not require medical attention (report to your doctor   or health care professional if they continue or are bothersome): -bone pain -muscle pain This list may not describe all possible side effects. Call your doctor for medical advice about side effects. You may report side effects to FDA at  1-800-FDA-1088. Where should I keep my medicine? Keep out of the reach of children. Store pre-filled syringes in a refrigerator between 2 and 8 degrees C (36 and 46 degrees F). Do not freeze. Keep in carton to protect from light. Throw away this medicine if it is left out of the refrigerator for more than 48 hours. Throw away any unused medicine after the expiration date. NOTE: This sheet is a summary. It may not cover all possible information. If you have questions about this medicine, talk to your doctor, pharmacist, or health care provider.  2015, Elsevier/Gold Standard. (2013-06-21 16:14:05)  

## 2014-03-12 NOTE — Telephone Encounter (Signed)
Pt called asking when she could go to the dentist.  Informed that she should wait at least 3 weeks from her chemo.  Verified with Dr Burr Medico .

## 2014-03-13 ENCOUNTER — Telehealth: Payer: Self-pay | Admitting: *Deleted

## 2014-03-13 ENCOUNTER — Telehealth: Payer: Self-pay | Admitting: Hematology

## 2014-03-13 NOTE — Telephone Encounter (Signed)
Pt calle stating that she had an U/S of breast but no bx of lymph node & she was concerned after seeing Dr Burr Medico & wanted to know if she should have had a biopsy of lymph node.  Reviewed chart & discussed with Dr Burr Medico & informed pt that she did not need the biopsy since u/s did not show an enlarged lymph node & everything has been done correctly so far.

## 2014-03-15 ENCOUNTER — Ambulatory Visit (HOSPITAL_COMMUNITY)
Admission: RE | Admit: 2014-03-15 | Discharge: 2014-03-15 | Disposition: A | Discharge status: Home / Self Care | Payer: Managed Care, Other (non HMO) | Source: Ambulatory Visit | Attending: Hematology | Admitting: Hematology

## 2014-03-15 DIAGNOSIS — E785 Hyperlipidemia, unspecified: Secondary | ICD-10-CM | POA: Diagnosis not present

## 2014-03-15 DIAGNOSIS — C50412 Malignant neoplasm of upper-outer quadrant of left female breast: Secondary | ICD-10-CM

## 2014-03-15 DIAGNOSIS — E119 Type 2 diabetes mellitus without complications: Secondary | ICD-10-CM | POA: Diagnosis not present

## 2014-03-15 DIAGNOSIS — D4860 Neoplasm of uncertain behavior of unspecified breast: Secondary | ICD-10-CM | POA: Diagnosis not present

## 2014-03-15 DIAGNOSIS — C50919 Malignant neoplasm of unspecified site of unspecified female breast: Secondary | ICD-10-CM

## 2014-03-15 DIAGNOSIS — I10 Essential (primary) hypertension: Secondary | ICD-10-CM | POA: Insufficient documentation

## 2014-03-25 ENCOUNTER — Telehealth: Payer: Self-pay | Admitting: *Deleted

## 2014-03-25 NOTE — Telephone Encounter (Signed)
Pt called and left message wanting to know when her MRI would be scheduled as informed by md at pt's last visit.  Pt has f/u appt with Dr. Barry Dienes on 04/09/14. Pt's  Phone     [phone]

## 2014-04-03 ENCOUNTER — Ambulatory Visit
Admission: RE | Admit: 2014-04-03 | Discharge: 2014-04-03 | Disposition: A | Discharge status: Home / Self Care | Payer: Managed Care, Other (non HMO) | Source: Ambulatory Visit | Attending: Hematology | Admitting: Hematology

## 2014-04-03 DIAGNOSIS — C50412 Malignant neoplasm of upper-outer quadrant of left female breast: Secondary | ICD-10-CM

## 2014-04-03 MED ORDER — GADOBENATE DIMEGLUMINE 529 MG/ML IV SOLN
17.0000 mL | Freq: Once | INTRAVENOUS | Status: AC | PRN
  Administered 2014-04-03: 17 mL via INTRAVENOUS

## 2014-04-08 ENCOUNTER — Encounter: Payer: Self-pay | Admitting: Hematology

## 2014-04-08 ENCOUNTER — Ambulatory Visit (HOSPITAL_BASED_OUTPATIENT_CLINIC_OR_DEPARTMENT_OTHER): Payer: Managed Care, Other (non HMO) | Admitting: Hematology

## 2014-04-08 ENCOUNTER — Ambulatory Visit: Payer: Managed Care, Other (non HMO)

## 2014-04-08 ENCOUNTER — Other Ambulatory Visit (HOSPITAL_BASED_OUTPATIENT_CLINIC_OR_DEPARTMENT_OTHER): Payer: Managed Care, Other (non HMO)

## 2014-04-08 ENCOUNTER — Ambulatory Visit (HOSPITAL_BASED_OUTPATIENT_CLINIC_OR_DEPARTMENT_OTHER): Payer: Managed Care, Other (non HMO)

## 2014-04-08 ENCOUNTER — Telehealth: Payer: Self-pay | Admitting: Hematology

## 2014-04-08 VITALS — BP 119/57 | HR 91 | Temp 98.2°F | Resp 18 | Ht 64.0 in | Wt 181.2 lb

## 2014-04-08 DIAGNOSIS — C50412 Malignant neoplasm of upper-outer quadrant of left female breast: Secondary | ICD-10-CM

## 2014-04-08 DIAGNOSIS — G629 Polyneuropathy, unspecified: Secondary | ICD-10-CM

## 2014-04-08 DIAGNOSIS — C50112 Malignant neoplasm of central portion of left female breast: Secondary | ICD-10-CM

## 2014-04-08 DIAGNOSIS — I10 Essential (primary) hypertension: Secondary | ICD-10-CM

## 2014-04-08 DIAGNOSIS — E119 Type 2 diabetes mellitus without complications: Secondary | ICD-10-CM

## 2014-04-08 DIAGNOSIS — Z5112 Encounter for antineoplastic immunotherapy: Secondary | ICD-10-CM

## 2014-04-08 DIAGNOSIS — D6481 Anemia due to antineoplastic chemotherapy: Secondary | ICD-10-CM

## 2014-04-08 LAB — COMPREHENSIVE METABOLIC PANEL (CC13)
ALBUMIN: 3.6 g/dL (ref 3.5–5.0)
ALK PHOS: 54 U/L (ref 40–150)
ALT: 17 U/L (ref 0–55)
ANION GAP: 7 meq/L (ref 3–11)
AST: 21 U/L (ref 5–34)
BUN: 20.4 mg/dL (ref 7.0–26.0)
CALCIUM: 9.4 mg/dL (ref 8.4–10.4)
CO2: 29 meq/L (ref 22–29)
Chloride: 106 mEq/L (ref 98–109)
Creatinine: 0.9 mg/dL (ref 0.6–1.1)
EGFR: 87 mL/min/{1.73_m2} — ABNORMAL LOW (ref >90)
GLUCOSE: 131 mg/dL (ref 70–140)
POTASSIUM: 4.2 meq/L (ref 3.5–5.1)
SODIUM: 142 meq/L (ref 136–145)
TOTAL PROTEIN: 6.2 g/dL — AB (ref 6.4–8.3)
Total Bilirubin: 0.58 mg/dL (ref 0.20–1.20)

## 2014-04-08 LAB — CBC WITH DIFFERENTIAL/PLATELET
BASO%: 0.2 % (ref 0.0–2.0)
Basophils Absolute: 0 10*3/uL (ref 0.0–0.1)
EOS%: 0 % (ref 0.0–7.0)
Eosinophils Absolute: 0 10*3/uL (ref 0.0–0.5)
HCT: 35.6 % (ref 34.8–46.6)
HGB: 11.5 g/dL — ABNORMAL LOW (ref 11.6–15.9)
LYMPH%: 13 % — AB (ref 14.0–49.7)
MCH: 32.1 pg (ref 25.1–34.0)
MCHC: 32.3 g/dL (ref 31.5–36.0)
MCV: 99.4 fL (ref 79.5–101.0)
MONO#: 0.3 10*3/uL (ref 0.1–0.9)
MONO%: 5.1 % (ref 0.0–14.0)
NEUT%: 81.7 % — ABNORMAL HIGH (ref 38.4–76.8)
NEUTROS ABS: 4.7 10*3/uL (ref 1.5–6.5)
Platelets: 248 10*3/uL (ref 145–400)
RBC: 3.58 10*6/uL — AB (ref 3.70–5.45)
RDW: 17.4 % — ABNORMAL HIGH (ref 11.2–14.5)
WBC: 5.7 10*3/uL (ref 3.9–10.3)
lymph#: 0.7 10*3/uL — ABNORMAL LOW (ref 0.9–3.3)

## 2014-04-08 MED ORDER — DIPHENHYDRAMINE HCL 25 MG PO CAPS
50.0000 mg | ORAL_CAPSULE | Freq: Once | ORAL | Status: AC
  Administered 2014-04-08: 50 mg via ORAL

## 2014-04-08 MED ORDER — ACETAMINOPHEN 325 MG PO TABS
650.0000 mg | ORAL_TABLET | Freq: Once | ORAL | Status: AC
  Administered 2014-04-08: 650 mg via ORAL

## 2014-04-08 MED ORDER — SODIUM CHLORIDE 0.9 % IJ SOLN
10.0000 mL | INTRAMUSCULAR | Status: DC | PRN
  Administered 2014-04-08: 10 mL
  Filled 2014-04-08: qty 10

## 2014-04-08 MED ORDER — HEPARIN SOD (PORK) LOCK FLUSH 100 UNIT/ML IV SOLN
500.0000 [IU] | Freq: Once | INTRAVENOUS | Status: AC | PRN
  Administered 2014-04-08: 500 [IU]
  Filled 2014-04-08: qty 5

## 2014-04-08 MED ORDER — DIPHENHYDRAMINE HCL 25 MG PO CAPS
ORAL_CAPSULE | ORAL | Status: AC
Start: 2014-04-08 — End: 2014-04-08
  Filled 2014-04-08: qty 2

## 2014-04-08 MED ORDER — ACETAMINOPHEN 325 MG PO TABS
ORAL_TABLET | ORAL | Status: AC
  Filled 2014-04-08: qty 2

## 2014-04-08 MED ORDER — TRASTUZUMAB CHEMO INJECTION 440 MG
6.0000 mg/kg | Freq: Once | INTRAVENOUS | Status: AC
  Administered 2014-04-08: 483 mg via INTRAVENOUS
  Filled 2014-04-08: qty 23

## 2014-04-08 MED ORDER — SODIUM CHLORIDE 0.9 % IV SOLN
Freq: Once | INTRAVENOUS | Status: AC
  Administered 2014-04-08: 12:00:00 via INTRAVENOUS

## 2014-04-08 NOTE — Patient Instructions (Signed)
[facility] Discharge Instructions for Patients Receiving Chemotherapy  Today you received the following chemotherapy agents Herceptin   To help prevent nausea and vomiting after your treatment, we encourage you to take your nausea medication as directed/prescribed   If you develop nausea and vomiting that is not controlled by your nausea medication, call the clinic.   BELOW ARE SYMPTOMS THAT SHOULD BE REPORTED IMMEDIATELY:  *FEVER GREATER THAN 100.5 F  *CHILLS WITH OR WITHOUT FEVER  NAUSEA AND VOMITING THAT IS NOT CONTROLLED WITH YOUR NAUSEA MEDICATION  *UNUSUAL SHORTNESS OF BREATH  *UNUSUAL BRUISING OR BLEEDING  TENDERNESS IN MOUTH AND THROAT WITH OR WITHOUT PRESENCE OF ULCERS  *URINARY PROBLEMS  *BOWEL PROBLEMS  UNUSUAL RASH Items with * indicate a potential emergency and should be followed up as soon as possible.  Feel free to call the clinic you have any questions or concerns. The clinic phone number is [phone].

## 2014-04-08 NOTE — Telephone Encounter (Signed)
gv adn printed appt sched and avs for pt for Jan adn Feb....sed added tx. °

## 2014-04-08 NOTE — Progress Notes (Signed)
[place] OFFICE PROGRESS NOTE  Patient Care Team: [name], MD as PCP - General (Internal Medicine) [name], MD as Consulting Physician (General Surgery)  SUMMARY OF ONCOLOGIC HISTORY: Breast cancer of upper-outer quadrant of left female breast   Staging form: Breast, AJCC 7th Edition     Clinical: Stage IIB (T3, N0, cM0) - Unsigned       Staging comments: Staged at breast conference 8.5.15      Pathologic: No stage assigned - Unsigned    Breast cancer of upper-outer quadrant of left female breast, T3N0M0 stage IIB    10/17/2013 Mammogram There is persistent distortion in the left upper outer quadrant, corresponding to the screening mammographic finding.    10/26/2013 Breast US Ultrasound is performed, showing a hypoechoic irregular mass with surrounding distortion in the left breast 2 o'clock location 4 cm from the nipple measuring 1.1 x 0.9 x 0.7 cm. This corresponds to the mammographic finding. 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 - CALCIFICATIONS IDENTIFIED   10/30/2013 Receptors her2 A sample was sent to [facility] for HER-2 testing by FISH. The results are as follows: Positive.Estrogen Receptor: 100%, POSITIVE, STRONG STAINING INTENSITY Progesterone Receptor: 42%, POSITIVE, MODERATE STAINING INTENSITY Proliferation Marker Ki67: 53%   11/05/2013 Breast MRI Irregular enhancing mass measuring 5.1 cm in the upper and upper-outer quadrants of the left breast. The MR enhancing mass is much larger than the mass seen mammographically or sonographically.    11/05/2013 Breast MRI Suspicious level 1 left axillary lymph node concerning for metastatic involving, but [place] negative, no node biopsy done    11/07/2013 Initial Diagnosis Breast cancer of upper-outer quadrant of left female breast   11/26/2013 -  Chemotherapy Cycle 1 of  chemotherapy with TCH-P (Taxotere, Carbolatin, Herceptin and Perjeta followed by Neulasta   12/17/2013 -  Chemotherapy Cyle 2 of TCH-P given today. Clinically very good response in tumor.   01/07/2014 -  Chemotherapy Cycle 3 of TCH-P given today. clinical improvement in breast cancer noted almost 80%.   01/28/2014 -  Chemotherapy Cycle 4 of THC-P given.    CURRENT THERAPY: Neoadjuvant chemo TCH-P, started on 12/17/2013, planned for 6 cycles   INTERVAL HISTORY: She returns for follow up. She is doing well overall. She has had nail change, and pain and numberness at finger tips, (+) numberness at tip of toes, which has improved. No fever, she has good appetite and eats well. No other new complains.   REVIEW OF SYSTEMS:   Constitutional: Denies fevers, chills or abnormal weight loss Eyes: Denies blurriness of vision Ears, nose, mouth, throat, and face: Denies mucositis or sore throat Respiratory: Denies cough, dyspnea or wheezes Cardiovascular: Denies palpitation, chest discomfort or lower extremity swelling Gastrointestinal:  Denies nausea, heartburn or change in bowel habits Skin: Denies abnormal skin rashes Lymphatics: Denies new lymphadenopathy or easy bruising Neurological:Denies numbness, tingling or new weaknesses Behavioral/Psych: Mood is stable, no new changes  All other systems were reviewed with the patient and are negative except those in history   MEDICAL HISTORY:  Past Medical History  Diagnosis Date  . Breast cancer   . Diabetes mellitus without complication   . Hypertension   . Alopecia areata   . Endometriosis   . Hypercholesteremia     SURGICAL HISTORY: Past Surgical History  Procedure Laterality Date  . Abdominal hysterectomy  BSO  . Colonoscopy    . Portacath placement N/A 11/20/2013    Procedure: INSERTION PORT-A-CATH;  Surgeon: [name], MD;  Location: [facility];  Service: General;  Laterality: N/A;    I have reviewed the social history and family  history with the patient and they are unchanged from previous note.  ALLERGIES:  is allergic to eggs or egg-derived products; sulfa antibiotics; tetanus toxoids; multihance; and penicillins.  MEDICATIONS:  Current Outpatient Prescriptions  Medication Sig Dispense Refill  . calcium citrate-vitamin D (CITRACAL+D) 315-200 MG-UNIT per tablet Take 1 tablet by mouth daily.    Marland Kitchen dexamethasone (DECADRON) 4 MG tablet Take 1 tablet (4 mg total) by mouth 2 (two) times daily. Start the day before Taxotere. Then again the day after chemo for 3 days. 30 tablet 1  . hydrochlorothiazide (HYDRODIURIL) 25 MG tablet Take 25 mg by mouth daily.    Marland Kitchen HYDROcodone-acetaminophen (NORCO) 5-325 MG per tablet Take 1-2 tablets by mouth every 4 (four) hours as needed for moderate pain or severe pain. 30 tablet 0  . lidocaine-prilocaine (EMLA) cream Apply one application to port-a-cath 1 - 2 hours prior to access. 30 g 1  . Multiple Vitamin (MULTIVITAMIN) tablet Take 1 tablet by mouth daily.    . promethazine (PHENERGAN) 25 MG tablet Take 25 mg by mouth every 6 (six) hours as needed for nausea or vomiting.     No current facility-administered medications for this visit.    PHYSICAL EXAMINATION: ECOG PERFORMANCE STATUS: 1 - Symptomatic but completely ambulatory  Filed Vitals:   04/08/14 1108  BP: 119/57  Pulse: 91  Temp: 98.2 F (36.8 C)  Resp: 18   Filed Weights   04/08/14 1108  Weight: 181 lb 3.2 oz (82.192 kg)    GENERAL:alert, no distress and comfortable SKIN: skin color, texture, turgor are normal, no rashes or significant lesions EYES: normal, Conjunctiva are pink and non-injected, sclera clear OROPHARYNX:no exudate, no erythema and lips, buccal mucosa, and tongue normal  NECK: supple, thyroid normal size, non-tender, without nodularity LYMPH:  no palpable lymphadenopathy in the cervical, axillary or inguinal LUNGS: clear to auscultation and percussion with normal breathing effort HEART: regular rate  & rhythm and no murmurs and no lower extremity edema ABDOMEN:abdomen soft, non-tender and normal bowel sounds Musculoskeletal:no cyanosis of digits and no clubbing  NEURO: alert & oriented x 3 with fluent speech, no focal motor/sensory deficits Breasts: Breast inspection showed them to be symmetrical with no skin change or  nipple discharge. Palpation of the breasts and axilla revealed no obvious mass that I could appreciate, except some fullness at the upper out quadrant of left breast.. EXTREMITIES: (+) Thickening of the nailbeds, mild pink discharge from the left middle finger nail bed, no surrounding skin edema or redness.   LABORATORY DATA:  I have reviewed the data as listed CBC Latest Ref Rng 04/08/2014 03/11/2014 02/14/2014  WBC 3.9 - 10.3 10e3/uL 5.7 7.9 7.3  Hemoglobin 11.6 - 15.9 g/dL 11.5(L) 11.0(L) 10.5(L)  Hematocrit 34.8 - 46.6 % 35.6 34.2(L) 32.4(L)  Platelets 145 - 400 10e3/uL 248 197 165     CMP Latest Ref Rng 04/08/2014 03/11/2014 02/14/2014  Glucose 70 - 140 mg/dl 131 106 96  BUN 7.0 - 26.0 mg/dL 20.4 18.4 16.2  Creatinine 0.6 - 1.1 mg/dL 0.9 0.8 0.9  Sodium 136 - 145 mEq/L 142 141 143  Potassium 3.5 - 5.1 mEq/L 4.2 4.0 4.4  CO2 22 - 29 mEq/L '29 27 29  ' Calcium 8.4 - 10.4  mg/dL 9.4 9.7 9.0  Total Protein 6.4 - 8.3 g/dL 6.2(L) 6.3(L) 5.8(L)  Total Bilirubin 0.20 - 1.20 mg/dL 0.58 0.43 0.42  Alkaline Phos 40 - 150 U/L 54 64 63  AST 5 - 34 U/L '21 29 29  ' ALT 0 - 55 U/L 17 52 40      RADIOGRAPHIC STUDIES: I have personally reviewed the radiological images as listed and agreed with the findings in the report.  BREAST MRI 04/03/2014  IMPRESSION: 1. There has been significant improvement in the non mass enhancement in the upper-outer quadrant of the left breast. Persistent faint enhancement is identified now measuring up to 4.4 cm. One of the biopsy sites is within the area of enhancement. The second biopsy site is not associated with persistent enhancement  on today's exam. 2. No suspicious lymph nodes identified on today's exam.   ASSESSMENT & PLAN:  This is a very pleasant 59 years-old Afro-american female from [facility] who presents with a left Invasive Ductal Carcinoma. This was picked on a mammogram and maximal diameter was 1.7 cm on Ultrasound however the MRI breast showed this mass to be large almost 5.1 cm in maximal dimension making it a clinical T3 tumor. Also it showed a suspicious looking solitary lymph node in axilla but [place] was negative, not biopsied.  Clinical T3N0M0 Stage IIB.  The tumor biology indicates a LUMINAL-B type which means ER/PR/HER2 + tumor. Tumor is strongly ER+100%, PR 42%,KI-67 Index 53% showing a brisk mitotic rate and grade is called grade 2 (but grading as per pathologist cannot be relied upon on initial biopsy).   1. 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  -I reviewed her breast MRI results which showed good partial response -We'll refer her back to her surgeon Dr. Barry Dienes to discuss her surgery.  -I'll continue her Herceptin every 3 weeks to complete a total of one year treatment. -She will start hormonal therapy after she completes surgery and adjuvant radiation (if she needs) -We'll monitor her medical function by repeating echo every 3 months. She is due, was scheduled as soon as possible.  2. Anemia, secondary to chemotherapy -Her anemia stable, no need for blood transfusion. I anticipate her anemia will improve after we complete her neoadjuvant chemotherapy.  3. Grade 1 peripheral neuropathy, and a mild intermittent ankle swollen, nail changes.  Likely secondary to chemotherapy (docetaxel) -Continue observation  4. HTN, DM -Follow up with primary care physician  Plan -cont herceptin q3w, start maintenance treatment today -See Dr. Barry Dienes tomorrow for surgery  -RTC in 3 weeks  All questions were  answered. The patient knows to call the clinic with any problems, questions or concerns. No barriers to learning was detected.  I spent 15 minutes counseling the patient face to face. The total time spent in the appointment was 20 minutes and more than 50% was on counseling and review of test results     Truitt Merle, MD 04/08/2014 11:24 AM

## 2014-04-09 ENCOUNTER — Other Ambulatory Visit (INDEPENDENT_AMBULATORY_CARE_PROVIDER_SITE_OTHER): Payer: Self-pay | Admitting: General Surgery

## 2014-04-09 DIAGNOSIS — C50912 Malignant neoplasm of unspecified site of left female breast: Secondary | ICD-10-CM

## 2014-04-09 NOTE — H&P (Signed)
Rachel [name] Rachel [name] 04/09/2014 3:21 PM Location: [facility] Patient #: [id number] DOB: 05/25/1955 Married / Language: English / Race: Black or African American Female  History of Present Illness [name] MD; 04/09/2014 3:50 PM) Patient words: Would like to speak about possible surgery on LT Breast.  The patient is a 59 year old female who presents with breast cancer. Patient is a 59 year old female who presented this summer with left-sided invasive ductal carcinoma. The ultrasound and mammogram demonstrated a 1.7 cm mass however the MRI showed this to be more like 5.1 cm. She did get biopsies of the anterior and posterior aspects of the tumor which were positive for cancer. There was a suspicious node on mammogram, but the ultrasound was negative sentinel lymph node was hot biopsied. 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 She has been receiving [place] P She has completed her preoperative therapy. She does have to get Herceptin for the rest of the year. Her recent MRI demonstrated a significant decrease in enhancement. The total size was still 4.1 cm. The patient does not want to pursue breast conservation and would like to get a mastectomy.   Other Problems Festus Holts, LPN; 0/06/90 3:30 PM) Breast Cancer Diabetes Mellitus High blood pressure Oophorectomy  Past Surgical History Festus Holts, LPN; 0/10/6224 3:33 PM) Breast Biopsy Left. Hysterectomy (not due to cancer) - Complete  Diagnostic Studies History Festus Holts, LPN; 08/07/5623 6:38 PM) Colonoscopy 5-10 years ago Mammogram within last year  Allergies Festus Holts, LPN; 12/06/7340 8:76 PM) Eggs Sulfa Antibiotics Tetanus Toxoids MultiHance *DIAGNOSTIC PRODUCTS* Penicillins  Medication History Festus Holts, LPN; 11/04/1570 6:20 PM) Hydrochlorothiazide (25MG Tablet, Oral) Active. Multivitamins (Oral) Active. Citracal Maximum (315-250MG-UNIT Tablet, Oral) Active. EMLA (2.5-2.5%  Cream, External prn) Active.  Social History Festus Holts, LPN; 06/07/5972 1:63 PM) No alcohol use No caffeine use No drug use Tobacco use Never smoker.  Family History Festus Holts, LPN; 11/07/5362 6:80 PM) Cerebrovascular Accident Mother, Sister. Diabetes Mellitus Father, Mother. Heart disease in female family member before age 44 Hypertension Mother.  Pregnancy / Birth History Festus Holts, LPN; 06/05/1222 8:25 PM) Age at menarche 34 years. Gravida 2 Maternal age 27-25 Para 1  Review of Systems Festus Holts LPN; 0/0/3704 8:88 PM) General Not Present- Appetite Loss, Chills, Fatigue, Fever, Night Sweats, Weight Gain and Weight Loss. Skin Not Present- Change in Wart/Mole, Dryness, Hives, Jaundice, New Lesions, Non-Healing Wounds, Rash and Ulcer. HEENT Present- Wears glasses/contact lenses. Not Present- Earache, Hearing Loss, Hoarseness, Nose Bleed, Oral Ulcers, Ringing in the Ears, Seasonal Allergies, Sinus Pain, Sore Throat, Visual Disturbances and Yellow Eyes. Respiratory Not Present- Bloody sputum, Chronic Cough, Difficulty Breathing, Snoring and Wheezing. Cardiovascular Not Present- Chest Pain, Difficulty Breathing Lying Down, Leg Cramps, Palpitations, Rapid Heart Rate, Shortness of Breath and Swelling of Extremities. Gastrointestinal Not Present- Abdominal Pain, Bloating, Bloody Stool, Change in Bowel Habits, Chronic diarrhea, Constipation, Difficulty Swallowing, Excessive gas, Gets full quickly at meals, Hemorrhoids, Indigestion, Nausea, Rectal Pain and Vomiting. Female Genitourinary Not Present- Frequency, Nocturia, Painful Urination, Pelvic Pain and Urgency. Psychiatric Not Present- Anxiety, Bipolar, Change in Sleep Pattern, Depression, Fearful and Frequent crying. Endocrine Not Present- Cold Intolerance, Excessive Hunger, Hair Changes, Heat Intolerance, Hot flashes and New Diabetes. Hematology Not Present- Easy Bruising, Excessive bleeding, Gland problems, HIV  and Persistent Infections.   Vitals Festus Holts LPN; 12/05/6943 0:38 PM) 04/09/2014 3:25 PM Weight: 181.38 lb Height: 65in Body Surface Area: 1.94 m Body Mass Index: 30.18 kg/m Temp.: 98.2F(Temporal)  Pulse: 76 (Regular)  Resp.: 18 (Unlabored)  BP: 144/92 (Sitting, Left Arm, Standard)    Physical Exam [name] MD; 04/09/2014 3:51 PM) General Mental Status-Alert. General Appearance-Consistent with stated age. Hydration-Well hydrated. Voice-Normal.  Head and Neck Head-normocephalic, atraumatic with no lesions or palpable masses.  Eye Sclera/Conjunctiva - Bilateral-No scleral icterus.  Chest and Lung Exam Chest and lung exam reveals -quiet, even and easy respiratory effort with no use of accessory muscles. Inspection Chest Wall - Normal. Back - normal.  Breast Note: No palpable mass in the left breast. no lymphadenopathy.   Cardiovascular Cardiovascular examination reveals -normal pedal pulses bilaterally. Note: regular rate and rhythm  Abdomen Inspection-Inspection Normal. Palpation/Percussion Palpation and Percussion of the abdomen reveal - Soft, Non Tender, No Rebound tenderness, No Rigidity (guarding) and No hepatosplenomegaly. Auscultation Auscultation of the abdomen reveals - Bowel sounds normal.  Peripheral Vascular Upper Extremity Inspection - Bilateral - Normal - No Clubbing, No Cyanosis, No Edema, Pulses Intact. Lower Extremity Palpation - Edema - Bilateral - No edema.  Neurologic Neurologic evaluation reveals -alert and oriented x 3 with no impairment of recent or remote memory. Mental Status-Normal.  Musculoskeletal Global Assessment -Note: no gross deformities.  Normal Exam - Left-Upper Extremity Strength Normal and Lower Extremity Strength Normal. Normal Exam - Right-Upper Extremity Strength Normal and Lower Extremity Strength Normal.  Lymphatic Head & Neck  General Head & Neck Lymphatics:  Bilateral - Description - Normal. Axillary  General Axillary Region: Bilateral - Description - Normal. Tenderness - Non Tender.    Assessment & Plan [name] MD; 04/09/2014 3:52 PM) PRIMARY CANCER OF UPPER OUTER QUADRANT OF LEFT FEMALE BREAST (174.4  C50.412) Impression: We will plan a left mastectomy with sentinel lymph node biopsy. I have discussed the risks of the surgery including bleeding, infection, damage to adjacent structures, pain, heart or lung issues. She is likely to need radiation based on the size of her tumor and the likelihood that her lymph node is involved. She would be a candidate for delayed reconstruction.  I think a mastectomy as a good decision for her because with the bracketed needle loc there is a frequent incidence of positive margins. She would be likely to require multiple re-excisions and perhaps a mastectomy anyway. She also does not have large breasts and would have a significant size discrepancy with the lumpectomy.  We will schedule this at the first available opportunity. Current Plans  Pt Education - [facility] Mastectomy [facility] Schedule for Surgery   Signed by [name], MD (04/09/2014 3:53 PM)

## 2014-04-29 ENCOUNTER — Ambulatory Visit: Payer: Managed Care, Other (non HMO)

## 2014-04-29 ENCOUNTER — Encounter: Payer: Self-pay | Admitting: Hematology

## 2014-04-29 ENCOUNTER — Ambulatory Visit (HOSPITAL_BASED_OUTPATIENT_CLINIC_OR_DEPARTMENT_OTHER): Payer: Managed Care, Other (non HMO)

## 2014-04-29 ENCOUNTER — Ambulatory Visit (HOSPITAL_BASED_OUTPATIENT_CLINIC_OR_DEPARTMENT_OTHER): Payer: Managed Care, Other (non HMO) | Admitting: Hematology

## 2014-04-29 ENCOUNTER — Other Ambulatory Visit: Payer: Managed Care, Other (non HMO)

## 2014-04-29 ENCOUNTER — Telehealth: Payer: Self-pay | Admitting: Hematology

## 2014-04-29 ENCOUNTER — Other Ambulatory Visit (HOSPITAL_BASED_OUTPATIENT_CLINIC_OR_DEPARTMENT_OTHER): Payer: Managed Care, Other (non HMO)

## 2014-04-29 VITALS — BP 136/69 | HR 76 | Temp 97.8°F | Resp 18 | Ht 64.0 in | Wt 173.5 lb

## 2014-04-29 DIAGNOSIS — C50012 Malignant neoplasm of nipple and areola, left female breast: Secondary | ICD-10-CM

## 2014-04-29 DIAGNOSIS — Z5112 Encounter for antineoplastic immunotherapy: Secondary | ICD-10-CM

## 2014-04-29 DIAGNOSIS — C50412 Malignant neoplasm of upper-outer quadrant of left female breast: Secondary | ICD-10-CM

## 2014-04-29 DIAGNOSIS — I10 Essential (primary) hypertension: Secondary | ICD-10-CM

## 2014-04-29 DIAGNOSIS — D6481 Anemia due to antineoplastic chemotherapy: Secondary | ICD-10-CM

## 2014-04-29 DIAGNOSIS — G620 Drug-induced polyneuropathy: Secondary | ICD-10-CM

## 2014-04-29 DIAGNOSIS — Z171 Estrogen receptor negative status [ER-]: Secondary | ICD-10-CM

## 2014-04-29 DIAGNOSIS — E119 Type 2 diabetes mellitus without complications: Secondary | ICD-10-CM

## 2014-04-29 LAB — COMPREHENSIVE METABOLIC PANEL (CC13)
ALT: 15 U/L (ref 0–55)
ANION GAP: 9 meq/L (ref 3–11)
AST: 18 U/L (ref 5–34)
Albumin: 3.7 g/dL (ref 3.5–5.0)
Alkaline Phosphatase: 58 U/L (ref 40–150)
BUN: 21.1 mg/dL (ref 7.0–26.0)
CALCIUM: 9.3 mg/dL (ref 8.4–10.4)
CHLORIDE: 107 meq/L (ref 98–109)
CO2: 28 mEq/L (ref 22–29)
CREATININE: 0.9 mg/dL (ref 0.6–1.1)
EGFR: 83 mL/min/{1.73_m2} — ABNORMAL LOW (ref >90)
Glucose: 79 mg/dl (ref 70–140)
Potassium: 4.1 mEq/L (ref 3.5–5.1)
Sodium: 145 mEq/L (ref 136–145)
Total Bilirubin: 0.42 mg/dL (ref 0.20–1.20)
Total Protein: 6.4 g/dL (ref 6.4–8.3)

## 2014-04-29 LAB — CBC WITH DIFFERENTIAL/PLATELET
BASO%: 0.9 % (ref 0.0–2.0)
BASOS ABS: 0 10*3/uL (ref 0.0–0.1)
EOS%: 1.7 % (ref 0.0–7.0)
Eosinophils Absolute: 0.1 10*3/uL (ref 0.0–0.5)
HEMATOCRIT: 38 % (ref 34.8–46.6)
HGB: 12.2 g/dL (ref 11.6–15.9)
LYMPH#: 1.4 10*3/uL (ref 0.9–3.3)
LYMPH%: 34.3 % (ref 14.0–49.7)
MCH: 31.2 pg (ref 25.1–34.0)
MCHC: 32 g/dL (ref 31.5–36.0)
MCV: 97.3 fL (ref 79.5–101.0)
MONO#: 0.5 10*3/uL (ref 0.1–0.9)
MONO%: 11.2 % (ref 0.0–14.0)
NEUT#: 2.2 10*3/uL (ref 1.5–6.5)
NEUT%: 51.9 % (ref 38.4–76.8)
PLATELETS: 175 10*3/uL (ref 145–400)
RBC: 3.91 10*6/uL (ref 3.70–5.45)
RDW: 15 % — ABNORMAL HIGH (ref 11.2–14.5)
WBC: 4.2 10*3/uL (ref 3.9–10.3)

## 2014-04-29 MED ORDER — ACETAMINOPHEN 325 MG PO TABS
650.0000 mg | ORAL_TABLET | Freq: Once | ORAL | Status: AC
  Administered 2014-04-29: 650 mg via ORAL

## 2014-04-29 MED ORDER — DIPHENHYDRAMINE HCL 25 MG PO CAPS
50.0000 mg | ORAL_CAPSULE | Freq: Once | ORAL | Status: AC
  Administered 2014-04-29: 50 mg via ORAL

## 2014-04-29 MED ORDER — ACETAMINOPHEN 325 MG PO TABS
ORAL_TABLET | ORAL | Status: AC
  Filled 2014-04-29: qty 2

## 2014-04-29 MED ORDER — TRASTUZUMAB CHEMO INJECTION 440 MG
6.0000 mg/kg | Freq: Once | INTRAVENOUS | Status: AC
  Administered 2014-04-29: 483 mg via INTRAVENOUS
  Filled 2014-04-29: qty 23

## 2014-04-29 MED ORDER — SODIUM CHLORIDE 0.9 % IV SOLN
Freq: Once | INTRAVENOUS | Status: AC
  Administered 2014-04-29: 10:00:00 via INTRAVENOUS

## 2014-04-29 MED ORDER — HEPARIN SOD (PORK) LOCK FLUSH 100 UNIT/ML IV SOLN
500.0000 [IU] | Freq: Once | INTRAVENOUS | Status: AC | PRN
  Administered 2014-04-29: 500 [IU]
  Filled 2014-04-29: qty 5

## 2014-04-29 MED ORDER — SODIUM CHLORIDE 0.9 % IJ SOLN
10.0000 mL | INTRAMUSCULAR | Status: DC | PRN
  Administered 2014-04-29: 10 mL
  Filled 2014-04-29: qty 10

## 2014-04-29 MED ORDER — DIPHENHYDRAMINE HCL 25 MG PO CAPS
ORAL_CAPSULE | ORAL | Status: AC
  Filled 2014-04-29: qty 2

## 2014-04-29 NOTE — Patient Instructions (Signed)
[facility] Discharge Instructions for Patients Receiving Chemotherapy  Today you received the following chemotherapy agents Herceptin.  To help prevent nausea and vomiting after your treatment, we encourage you to take your nausea medication as directed.   If you develop nausea and vomiting that is not controlled by your nausea medication, call the clinic.   BELOW ARE SYMPTOMS THAT SHOULD BE REPORTED IMMEDIATELY:  *FEVER GREATER THAN 100.5 F  *CHILLS WITH OR WITHOUT FEVER  NAUSEA AND VOMITING THAT IS NOT CONTROLLED WITH YOUR NAUSEA MEDICATION  *UNUSUAL SHORTNESS OF BREATH  *UNUSUAL BRUISING OR BLEEDING  TENDERNESS IN MOUTH AND THROAT WITH OR WITHOUT PRESENCE OF ULCERS  *URINARY PROBLEMS  *BOWEL PROBLEMS  UNUSUAL RASH Items with * indicate a potential emergency and should be followed up as soon as possible.  Feel free to call the clinic you have any questions or concerns. The clinic phone number is [phone].  

## 2014-04-29 NOTE — Telephone Encounter (Signed)
gv and printed appt sched anda vs for pt for Feb and march...sed added tx....sent msg to Tia Masker. to auth echo will call pt with d.t

## 2014-04-29 NOTE — Progress Notes (Signed)
[facility] OFFICE PROGRESS NOTE  Patient Care Team: [name], MD as PCP - General (Internal Medicine) [name], MD as Consulting Physician (General Surgery)  SUMMARY OF ONCOLOGIC HISTORY:   Breast cancer of upper-outer quadrant of left female breast   10/17/2013 Mammogram There is persistent distortion in the left upper outer quadrant, corresponding to the screening mammographic finding.    10/26/2013 Breast US Ultrasound is performed, showing a hypoechoic irregular mass with surrounding distortion in the left breast 2 o'clock location 4 cm from the nipple measuring 1.1 x 0.9 x 0.7 cm. This corresponds to the mammographic finding. 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 - CALCIFICATIONS IDENTIFIED   10/30/2013 Receptors her2 tumor molecular markers: ER 100%, POSITIVE, PR: 42%, POSITIVE, Ki67: 53%; HER2 (+) (ration 2.8, copy number 3.5)   11/05/2013 Breast MRI Irregular enhancing mass measuring 5.1 cm in the upper and upper-outer quadrants of the left breast. The MR enhancing mass is much larger than the mass seen mammographically or sonographically.    11/05/2013 Breast MRI Suspicious level 1 left axillary lymph node concerning for metastatic involving. But [place] negative, no node biopsy.    11/07/2013 Initial Diagnosis Breast cancer of upper-outer quadrant of left female breast   11/26/2013 - 03/11/2014 Chemotherapy 6 cycles of neoadjuvant chemotherapy with TCH-P (Taxotere, Carbolatin, Herceptin and Perjeta followed by Neulasta   04/08/2014 -  Chemotherapy Herceptin maintenance      CURRENT THERAPY: Herceptin maintenance therapy started on 04/08/2014, every 3 weeks   INTERVAL HISTORY: She returns for follow up. She is doing well overall. Her nails are still dark and thick, but no more discharge and pain. Her neuropathy has improved also,  her sensation on feet is much better. She has some pealing skin on feet, but no oepn wound. She is eating better, exercise regularly. Her blurry vision has improved. She is scheduled to have surgery on 2/3.    REVIEW OF SYSTEMS:   Constitutional: Denies fevers, chills, she intentionally lost about 5 lbs lately  Eyes: Denies blurriness of vision Ears, nose, mouth, throat, and face: Denies mucositis or sore throat Respiratory: Denies cough, dyspnea or wheezes Cardiovascular: Denies palpitation, chest discomfort or lower extremity swelling Gastrointestinal:  Denies nausea, heartburn or change in bowel habits Skin: Denies abnormal skin rashes Lymphatics: Denies new lymphadenopathy or easy bruising Neurological:Denies numbness, tingling or new weaknesses Behavioral/Psych: Mood is stable, no new changes  All other systems were reviewed with the patient and are negative except those in history   MEDICAL HISTORY:  Past Medical History  Diagnosis Date  . Breast cancer   . Diabetes mellitus without complication   . Hypertension   . Alopecia areata   . Endometriosis   . Hypercholesteremia     SURGICAL HISTORY: Past Surgical History  Procedure Laterality Date  . Abdominal hysterectomy      BSO  . Colonoscopy    . Portacath placement N/A 11/20/2013    Procedure: INSERTION PORT-A-CATH;  Surgeon: [name], MD;  Location: [facility];  Service: General;  Laterality: N/A;    I have reviewed the social history and family history with the patient and they are unchanged from previous note.  ALLERGIES:  is allergic to eggs or egg-derived products; sulfa antibiotics; tetanus toxoids; multihance; and penicillins.  MEDICATIONS:    Medication List       This  list is accurate as of: 04/29/14  9:16 AM.  Always use your most recent med list.               calcium citrate-vitamin D 315-200 MG-UNIT per tablet  Commonly known as:  CITRACAL+D  Take 1 tablet by mouth daily.     dexamethasone 4 MG  tablet  Commonly known as:  DECADRON  Take 1 tablet (4 mg total) by mouth 2 (two) times daily. Start the day before Taxotere. Then again the day after chemo for 3 days.     hydrochlorothiazide 25 MG tablet  Commonly known as:  HYDRODIURIL  Take 25 mg by mouth daily.     HYDROcodone-acetaminophen 5-325 MG per tablet  Commonly known as:  NORCO  Take 1-2 tablets by mouth every 4 (four) hours as needed for moderate pain or severe pain.     lidocaine-prilocaine cream  Commonly known as:  EMLA  Apply one application to port-a-cath 1 - 2 hours prior to access.     multivitamin tablet  Take 1 tablet by mouth daily.     promethazine 25 MG tablet  Commonly known as:  PHENERGAN  Take 25 mg by mouth every 6 (six) hours as needed for nausea or vomiting.         PHYSICAL EXAMINATION: ECOG PERFORMANCE STATUS: 0  Filed Vitals:   04/29/14 0835  BP: 136/69  Pulse: 76  Temp: 97.8 F (36.6 C)  Resp: 18   Filed Weights   04/29/14 0835  Weight: 173 lb 8 oz (78.699 kg)    GENERAL:alert, no distress and comfortable SKIN: skin color, texture, turgor are normal, no rashes or significant lesions EYES: normal, Conjunctiva are pink and non-injected, sclera clear OROPHARYNX:no exudate, no erythema and lips, buccal mucosa, and tongue normal  NECK: supple, thyroid normal size, non-tender, without nodularity LYMPH:  no palpable lymphadenopathy in the cervical, axillary or inguinal LUNGS: clear to auscultation and percussion with normal breathing effort HEART: regular rate & rhythm and no murmurs and no lower extremity edema ABDOMEN:abdomen soft, non-tender and normal bowel sounds Musculoskeletal:no cyanosis of digits and no clubbing  NEURO: alert & oriented x 3 with fluent speech, no focal motor/sensory deficits Breasts: Breast inspection showed them to be symmetrical with no skin change or  nipple discharge. Palpation of the breasts and axilla revealed no obvious mass that I could appreciate,  except some fullness at the upper out quadrant of left breast.. EXTREMITIES: (+) Thickening of the nailbeds, no discharge from the left middle finger nail bed, no surrounding skin edema or redness.   LABORATORY DATA:  I have reviewed the data as listed CBC Latest Ref Rng 04/29/2014 04/08/2014 03/11/2014  WBC 3.9 - 10.3 10e3/uL 4.2 5.7 7.9  Hemoglobin 11.6 - 15.9 g/dL 12.2 11.5(L) 11.0(L)  Hematocrit 34.8 - 46.6 % 38.0 35.6 34.2(L)  Platelets 145 - 400 10e3/uL 175 248 197     CMP Latest Ref Rng 04/29/2014 04/08/2014 03/11/2014  Glucose 70 - 140 mg/dl 79 131 106  BUN 7.0 - 26.0 mg/dL 21.1 20.4 18.4  Creatinine 0.6 - 1.1 mg/dL 0.9 0.9 0.8  Sodium 136 - 145 mEq/L 145 142 141  Potassium 3.5 - 5.1 mEq/L 4.1 4.2 4.0  CO2 22 - 29 mEq/L '28 29 27  ' Calcium 8.4 - 10.4 mg/dL 9.3 9.4 9.7  Total Protein 6.4 - 8.3 g/dL 6.4 6.2(L) 6.3(L)  Total Bilirubin 0.20 - 1.20 mg/dL 0.42 0.58 0.43  Alkaline Phos 40 - 150 U/L 58 54 64  AST 5 - 34 U/L '18 21 29  ' ALT 0 - 55 U/L 15 17 52      RADIOGRAPHIC STUDIES: I have personally reviewed the radiological images as listed and agreed with the findings in the report.  BREAST MRI 04/03/2014  IMPRESSION: 1. There has been significant improvement in the non mass enhancement in the upper-outer quadrant of the left breast. Persistent faint enhancement is identified now measuring up to 4.4 cm. One of the biopsy sites is within the area of enhancement. The second biopsy site is not associated with persistent enhancement on today's exam. 2. No suspicious lymph nodes identified on today's exam.   ASSESSMENT & PLAN:  This is a very pleasant 59 years-old Afro-american female from [facility] who presents with a left Invasive Ductal Carcinoma. This was picked on a mammogram and maximal diameter was 1.7 cm on Ultrasound however the MRI breast showed this mass to be large almost 5.1 cm in maximal dimension making it a clinical T3 tumor. Also it showed a suspicious looking  solitary lymph node in axilla but [place] was negative, not biopsied.  Clinical T3N0M0 Stage IIB.  The tumor biology indicates a LUMINAL-B type which means ER/PR/HER2 + tumor. Tumor is strongly ER+100%, PR 42%,KI-67 Index 53% showing a brisk mitotic rate and grade is called grade 2 (but grading as per pathologist cannot be relied upon on initial biopsy).   1. 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  -her restaging breast MRI results showed good partial response -She is scheduled to have mastectomy and SLN biopsy on 2/3 -I'll continue her Herceptin every 3 weeks to complete a total of one year treatment, next on 2/15 -She will start hormonal therapy after she completes surgery and adjuvant radiation (if she needs) -We'll monitor her heart function by repeating echo every 3 months. She is due on 06/14/14  2. Anemia, secondary to chemotherapy -improved  3. Grade 1 peripheral neuropathy, and a mild intermittent ankle swollen, nail changes.  Likely secondary to chemotherapy (docetaxel) -Continue observation  4. HTN, DM -Follow up with primary care physician  Plan -cont herceptin q3w -repeat echo on 06/14/2014  -RTC in 3 weeks  All questions were answered. The patient knows to call the clinic with any problems, questions or concerns. No barriers to learning was detected.  I spent 15 minutes counseling the patient face to face. The total time spent in the appointment was 20 minutes and more than 50% was on counseling and review of test results     Truitt Merle, MD 04/29/2014   9:16 AM

## 2014-05-01 ENCOUNTER — Encounter (HOSPITAL_COMMUNITY)
Admission: RE | Admit: 2014-05-01 | Discharge: 2014-05-01 | Disposition: A | Discharge status: Home / Self Care | Payer: Managed Care, Other (non HMO) | Source: Ambulatory Visit | Attending: General Surgery | Admitting: General Surgery

## 2014-05-01 ENCOUNTER — Encounter (HOSPITAL_COMMUNITY): Payer: Self-pay

## 2014-05-01 DIAGNOSIS — Z01812 Encounter for preprocedural laboratory examination: Secondary | ICD-10-CM | POA: Diagnosis present

## 2014-05-01 DIAGNOSIS — C50919 Malignant neoplasm of unspecified site of unspecified female breast: Secondary | ICD-10-CM | POA: Diagnosis not present

## 2014-05-01 LAB — GLUCOSE, CAPILLARY: Glucose-Capillary: 86 mg/dL (ref 70–99)

## 2014-05-01 LAB — HEMOGLOBIN A1C
HEMOGLOBIN A1C: 5.5 % (ref <5.7)
Mean Plasma Glucose: 111 mg/dL (ref <117)

## 2014-05-01 NOTE — Pre-Procedure Instructions (Addendum)
Rachel [name]  05/01/2014   Your procedure is scheduled on:  05/08/14  Report to [facility] short stay admitting at 630 AM.  Call this number if you have problems the morning of surgery: [phone]   Remember:   Do not eat food or drink liquids after midnight.   Take these medicines the morning of surgery with A SIP OF WATER: pain med if needed  STOP all herbel meds, nsaids (aleve,naproxen,advil,ibuprofen) 5 days prior to surgery starting 05/03/14 including vitamins ,aspirin    Do not wear jewelry, make-up or nail polish.  Do not wear lotions, powders, or perfumes. You may wear deodorant.  Do not shave 48 hours prior to surgery. Men may shave face and neck.  Do not bring valuables to the hospital.  [facility] is not responsible                  for any belongings or valuables.               Contacts, dentures or bridgework may not be worn into surgery.  Leave suitcase in the car. After surgery it may be brought to your room.  For patients admitted to the hospital, discharge time is determined by your                treatment team.               Patients discharged the day of surgery will not be allowed to drive  home.  Name and phone number of your driver:   Special Instructions:  Special Instructions: [facility] - Preparing for Surgery  Before surgery, you can play an important role.  Because skin is not sterile, your skin needs to be as free of germs as possible.  You can reduce the number of germs on you skin by washing with CHG (chlorahexidine gluconate) soap before surgery.  CHG is an antiseptic cleaner which kills germs and bonds with the skin to continue killing germs even after washing.  Please DO NOT use if you have an allergy to CHG or antibacterial soaps.  If your skin becomes reddened/irritated stop using the CHG and inform your nurse when you arrive at Short Stay.  Do not shave (including legs and underarms) for at least 48 hours prior to the first CHG shower.   You may shave your face.  Please follow these instructions carefully:   1.  Shower with CHG Soap the night before surgery and the morning of Surgery.  2.  If you choose to wash your hair, wash your hair first as usual with your normal shampoo.  3.  After you shampoo, rinse your hair and body thoroughly to remove the Shampoo.  4.  Use CHG as you would any other liquid soap.  You can apply chg directly  to the skin and wash gently with scrungie or a clean washcloth.  5.  Apply the CHG Soap to your body ONLY FROM THE NECK DOWN.  Do not use on open wounds or open sores.  Avoid contact with your eyes ears, mouth and genitals (private parts).  Wash genitals (private parts)       with your normal soap.  6.  Wash thoroughly, paying special attention to the area where your surgery will be performed.  7.  Thoroughly rinse your body with warm water from the neck down.  8.  DO NOT shower/wash with your normal soap after using and rinsing off the CHG Soap.  9.  Pat yourself dry with a clean towel.            10.  Wear clean pajamas.            11.  Place clean sheets on your bed the night of your first shower and do not sleep with pets.  Day of Surgery  Do not apply any lotions/deodorants the morning of surgery.  Please wear clean clothes to the hospital/surgery center.   Please read over the following fact sheets that you were given: Pain Booklet, Coughing and Deep Breathing and Surgical Site Infection Prevention

## 2014-05-06 ENCOUNTER — Encounter: Payer: Self-pay | Admitting: Hematology

## 2014-05-06 NOTE — Progress Notes (Signed)
Called patient back. She said her living will was scanned into the [facility] system

## 2014-05-07 ENCOUNTER — Other Ambulatory Visit: Payer: Self-pay | Admitting: Physician Assistant

## 2014-05-07 ENCOUNTER — Other Ambulatory Visit: Payer: Self-pay | Admitting: Nurse Practitioner

## 2014-05-07 MED ORDER — CIPROFLOXACIN IN D5W 400 MG/200ML IV SOLN
400.0000 mg | INTRAVENOUS | Status: AC
  Administered 2014-05-08: 400 mg via INTRAVENOUS
  Filled 2014-05-07: qty 200

## 2014-05-08 ENCOUNTER — Encounter (HOSPITAL_COMMUNITY)
Admission: RE | Disposition: A | Discharge status: Home / Self Care | Payer: Self-pay | Source: Ambulatory Visit | Attending: General Surgery

## 2014-05-08 ENCOUNTER — Encounter (HOSPITAL_COMMUNITY)
Admission: RE | Admit: 2014-05-08 | Discharge: 2014-05-08 | Disposition: A | Discharge status: Home / Self Care | Payer: Managed Care, Other (non HMO) | Source: Ambulatory Visit | Attending: General Surgery | Admitting: General Surgery

## 2014-05-08 ENCOUNTER — Ambulatory Visit (HOSPITAL_COMMUNITY): Payer: Managed Care, Other (non HMO) | Admitting: Certified Registered Nurse Anesthetist

## 2014-05-08 ENCOUNTER — Ambulatory Visit (HOSPITAL_COMMUNITY)
Admission: RE | Admit: 2014-05-08 | Discharge: 2014-05-09 | Disposition: A | Discharge status: Home / Self Care | Payer: Managed Care, Other (non HMO) | Source: Ambulatory Visit | Attending: General Surgery | Admitting: General Surgery

## 2014-05-08 ENCOUNTER — Encounter (HOSPITAL_COMMUNITY): Payer: Self-pay | Admitting: *Deleted

## 2014-05-08 DIAGNOSIS — Z79899 Other long term (current) drug therapy: Secondary | ICD-10-CM | POA: Insufficient documentation

## 2014-05-08 DIAGNOSIS — Z91012 Allergy to eggs: Secondary | ICD-10-CM | POA: Diagnosis not present

## 2014-05-08 DIAGNOSIS — Z880 Allergy status to penicillin: Secondary | ICD-10-CM | POA: Diagnosis not present

## 2014-05-08 DIAGNOSIS — Z887 Allergy status to serum and vaccine status: Secondary | ICD-10-CM | POA: Diagnosis not present

## 2014-05-08 DIAGNOSIS — C50912 Malignant neoplasm of unspecified site of left female breast: Secondary | ICD-10-CM | POA: Insufficient documentation

## 2014-05-08 DIAGNOSIS — Z882 Allergy status to sulfonamides status: Secondary | ICD-10-CM | POA: Insufficient documentation

## 2014-05-08 DIAGNOSIS — I10 Essential (primary) hypertension: Secondary | ICD-10-CM | POA: Insufficient documentation

## 2014-05-08 DIAGNOSIS — E119 Type 2 diabetes mellitus without complications: Secondary | ICD-10-CM | POA: Insufficient documentation

## 2014-05-08 HISTORY — PX: MASTECTOMY W/ SENTINEL NODE BIOPSY: SHX2001

## 2014-05-08 LAB — CBC
HCT: 29.9 % — ABNORMAL LOW (ref 36.0–46.0)
Hemoglobin: 10 g/dL — ABNORMAL LOW (ref 12.0–15.0)
MCH: 30.9 pg (ref 26.0–34.0)
MCHC: 33.4 g/dL (ref 30.0–36.0)
MCV: 92.3 fL (ref 78.0–100.0)
Platelets: 146 10*3/uL — ABNORMAL LOW (ref 150–400)
RBC: 3.24 MIL/uL — AB (ref 3.87–5.11)
RDW: 14.4 % (ref 11.5–15.5)
WBC: 4.9 10*3/uL (ref 4.0–10.5)

## 2014-05-08 LAB — GLUCOSE, CAPILLARY
GLUCOSE-CAPILLARY: 104 mg/dL — AB (ref 70–99)
Glucose-Capillary: 87 mg/dL (ref 70–99)

## 2014-05-08 SURGERY — MASTECTOMY WITH SENTINEL LYMPH NODE BIOPSY
Anesthesia: General | Site: Breast | Laterality: Left

## 2014-05-08 MED ORDER — BUPIVACAINE-EPINEPHRINE 0.5% -1:200000 IJ SOLN
INTRAMUSCULAR | Status: DC | PRN
  Administered 2014-05-08: 40 mL

## 2014-05-08 MED ORDER — KETOROLAC TROMETHAMINE 30 MG/ML IJ SOLN
INTRAMUSCULAR | Status: AC
  Administered 2014-05-08: 30 mg via INTRAVENOUS
  Filled 2014-05-08: qty 1

## 2014-05-08 MED ORDER — METOCLOPRAMIDE HCL 5 MG/ML IJ SOLN
INTRAMUSCULAR | Status: DC | PRN
  Administered 2014-05-08: 10 mg via INTRAVENOUS

## 2014-05-08 MED ORDER — HYDROCODONE-ACETAMINOPHEN 5-325 MG PO TABS: 1.0000 | ORAL_TABLET | ORAL | Status: DC | PRN

## 2014-05-08 MED ORDER — HYDROCHLOROTHIAZIDE 25 MG PO TABS
25.0000 mg | ORAL_TABLET | Freq: Every day | ORAL | Status: DC
  Administered 2014-05-09: 25 mg via ORAL
  Filled 2014-05-08: qty 1

## 2014-05-08 MED ORDER — FENTANYL CITRATE 0.05 MG/ML IJ SOLN
INTRAMUSCULAR | Status: DC | PRN
  Administered 2014-05-08: 25 ug via INTRAVENOUS
  Administered 2014-05-08 (×2): 50 ug via INTRAVENOUS
  Administered 2014-05-08: 25 ug via INTRAVENOUS
  Administered 2014-05-08: 50 ug via INTRAVENOUS
  Administered 2014-05-08 (×2): 25 ug via INTRAVENOUS

## 2014-05-08 MED ORDER — ACETAMINOPHEN 500 MG PO TABS: 500.0000 mg | ORAL_TABLET | Freq: Four times a day (QID) | ORAL | Status: DC | PRN

## 2014-05-08 MED ORDER — SUCCINYLCHOLINE CHLORIDE 20 MG/ML IJ SOLN
INTRAMUSCULAR | Status: AC
  Filled 2014-05-08: qty 1

## 2014-05-08 MED ORDER — LIDOCAINE HCL (CARDIAC) 20 MG/ML IV SOLN
INTRAVENOUS | Status: AC
  Filled 2014-05-08: qty 5

## 2014-05-08 MED ORDER — ONDANSETRON HCL 4 MG/2ML IJ SOLN
INTRAMUSCULAR | Status: AC
  Filled 2014-05-08: qty 2

## 2014-05-08 MED ORDER — KETOROLAC TROMETHAMINE 30 MG/ML IJ SOLN
30.0000 mg | Freq: Once | INTRAMUSCULAR | Status: AC | PRN
  Administered 2014-05-08: 30 mg via INTRAVENOUS

## 2014-05-08 MED ORDER — SODIUM CHLORIDE 0.9 % IJ SOLN
INTRAMUSCULAR | Status: AC
  Filled 2014-05-08: qty 10

## 2014-05-08 MED ORDER — 0.9 % SODIUM CHLORIDE (POUR BTL) OPTIME
TOPICAL | Status: DC | PRN
  Administered 2014-05-08 (×2): 1000 mL

## 2014-05-08 MED ORDER — LACTATED RINGERS IV SOLN
INTRAVENOUS | Status: DC | PRN
  Administered 2014-05-08 (×2): via INTRAVENOUS

## 2014-05-08 MED ORDER — LIDOCAINE HCL (CARDIAC) 10 MG/ML IV SOLN
INTRAVENOUS | Status: DC | PRN
  Administered 2014-05-08: 100 mg via INTRAVENOUS

## 2014-05-08 MED ORDER — KCL IN DEXTROSE-NACL 20-5-0.45 MEQ/L-%-% IV SOLN
INTRAVENOUS | Status: DC
  Administered 2014-05-08 – 2014-05-09 (×2): via INTRAVENOUS
  Filled 2014-05-08 (×4): qty 1000

## 2014-05-08 MED ORDER — HYDROMORPHONE HCL 1 MG/ML IJ SOLN
INTRAMUSCULAR | Status: AC
  Administered 2014-05-08: 0.5 mg via INTRAVENOUS
  Filled 2014-05-08: qty 1

## 2014-05-08 MED ORDER — PROPOFOL 10 MG/ML IV BOLUS
INTRAVENOUS | Status: AC
  Filled 2014-05-08: qty 20

## 2014-05-08 MED ORDER — ONDANSETRON HCL 4 MG/2ML IJ SOLN: 4.0000 mg | Freq: Four times a day (QID) | INTRAMUSCULAR | Status: DC | PRN

## 2014-05-08 MED ORDER — BUPIVACAINE-EPINEPHRINE (PF) 0.5% -1:200000 IJ SOLN
INTRAMUSCULAR | Status: AC
  Filled 2014-05-08: qty 30

## 2014-05-08 MED ORDER — HYDROMORPHONE HCL 1 MG/ML IJ SOLN
0.2500 mg | INTRAMUSCULAR | Status: DC | PRN
  Administered 2014-05-08 (×2): 0.5 mg via INTRAVENOUS

## 2014-05-08 MED ORDER — FENTANYL CITRATE 0.05 MG/ML IJ SOLN
INTRAMUSCULAR | Status: AC
  Filled 2014-05-08: qty 5

## 2014-05-08 MED ORDER — ARTIFICIAL TEARS OP OINT
TOPICAL_OINTMENT | OPHTHALMIC | Status: AC
  Filled 2014-05-08: qty 3.5

## 2014-05-08 MED ORDER — PROPOFOL 10 MG/ML IV BOLUS
INTRAVENOUS | Status: DC | PRN
  Administered 2014-05-08: 200 mg via INTRAVENOUS

## 2014-05-08 MED ORDER — METOCLOPRAMIDE HCL 5 MG/ML IJ SOLN
INTRAMUSCULAR | Status: AC
  Filled 2014-05-08: qty 2

## 2014-05-08 MED ORDER — METHYLENE BLUE 1 % INJ SOLN
INTRAMUSCULAR | Status: AC
  Filled 2014-05-08: qty 10

## 2014-05-08 MED ORDER — KETOROLAC TROMETHAMINE 15 MG/ML IJ SOLN
15.0000 mg | Freq: Four times a day (QID) | INTRAMUSCULAR | Status: DC | PRN
  Filled 2014-05-08: qty 1

## 2014-05-08 MED ORDER — ONDANSETRON HCL 4 MG PO TABS: 4.0000 mg | ORAL_TABLET | Freq: Four times a day (QID) | ORAL | Status: DC | PRN

## 2014-05-08 MED ORDER — ONDANSETRON HCL 4 MG/2ML IJ SOLN
INTRAMUSCULAR | Status: DC | PRN
  Administered 2014-05-08: 4 mg via INTRAVENOUS

## 2014-05-08 MED ORDER — MIDAZOLAM HCL 2 MG/2ML IJ SOLN
INTRAMUSCULAR | Status: AC
  Filled 2014-05-08: qty 2

## 2014-05-08 MED ORDER — SODIUM CHLORIDE 0.9 % IJ SOLN
INTRAMUSCULAR | Status: DC | PRN
  Administered 2014-05-08: 5 mL via INTRAMUSCULAR

## 2014-05-08 MED ORDER — CIPROFLOXACIN IN D5W 400 MG/200ML IV SOLN
400.0000 mg | Freq: Two times a day (BID) | INTRAVENOUS | Status: AC
  Administered 2014-05-08: 400 mg via INTRAVENOUS
  Filled 2014-05-08: qty 200

## 2014-05-08 MED ORDER — KETOROLAC TROMETHAMINE 15 MG/ML IJ SOLN: 15.0000 mg | Freq: Four times a day (QID) | INTRAMUSCULAR | Status: DC

## 2014-05-08 MED ORDER — MORPHINE SULFATE 2 MG/ML IJ SOLN: 1.0000 mg | INTRAMUSCULAR | Status: DC | PRN

## 2014-05-08 MED ORDER — SODIUM CHLORIDE 0.9 % IV SOLN
10.0000 mg | INTRAVENOUS | Status: DC | PRN
  Administered 2014-05-08: 10 ug/min via INTRAVENOUS

## 2014-05-08 MED ORDER — ROCURONIUM BROMIDE 50 MG/5ML IV SOLN
INTRAVENOUS | Status: AC
  Filled 2014-05-08: qty 1

## 2014-05-08 MED ORDER — TECHNETIUM TC 99M SULFUR COLLOID FILTERED: 1.0000 | Freq: Once | INTRAVENOUS | Status: AC | PRN

## 2014-05-08 MED ORDER — SODIUM CHLORIDE 0.9 % IJ SOLN
INTRAMUSCULAR | Status: DC | PRN
  Administered 2014-05-08: 60 mL

## 2014-05-08 MED ORDER — PROMETHAZINE HCL 25 MG/ML IJ SOLN: 6.2500 mg | INTRAMUSCULAR | Status: DC | PRN

## 2014-05-08 MED ORDER — PHENYLEPHRINE HCL 10 MG/ML IJ SOLN
INTRAMUSCULAR | Status: DC | PRN
  Administered 2014-05-08: 40 ug via INTRAVENOUS
  Administered 2014-05-08 (×3): 80 ug via INTRAVENOUS
  Administered 2014-05-08: 120 ug via INTRAVENOUS

## 2014-05-08 MED ORDER — PHENYLEPHRINE 40 MCG/ML (10ML) SYRINGE FOR IV PUSH (FOR BLOOD PRESSURE SUPPORT)
PREFILLED_SYRINGE | INTRAVENOUS | Status: AC
  Filled 2014-05-08: qty 10

## 2014-05-08 MED ORDER — MIDAZOLAM HCL 5 MG/5ML IJ SOLN
INTRAMUSCULAR | Status: DC | PRN
  Administered 2014-05-08: 2 mg via INTRAVENOUS

## 2014-05-08 SURGICAL SUPPLY — 62 items
BINDER BREAST XLRG (GAUZE/BANDAGES/DRESSINGS) ×2 IMPLANT
BNDG COHESIVE 4X5 TAN STRL (GAUZE/BANDAGES/DRESSINGS) ×3 IMPLANT
CANISTER SUCTION 2500CC (MISCELLANEOUS) ×6 IMPLANT
CHLORAPREP W/TINT 26ML (MISCELLANEOUS) ×3 IMPLANT
CLIP TI LARGE 6 (CLIP) ×3 IMPLANT
CLIP TI MEDIUM 6 (CLIP) ×5 IMPLANT
CLIP TI WIDE RED SMALL 6 (CLIP) ×3 IMPLANT
CLOSURE STERI-STRIP 1/2X4 (GAUZE/BANDAGES/DRESSINGS) ×1
CLOSURE WOUND 1/2 X4 (GAUZE/BANDAGES/DRESSINGS) ×2
CLSR STERI-STRIP ANTIMIC 1/2X4 (GAUZE/BANDAGES/DRESSINGS) ×1 IMPLANT
CONT SPEC 4OZ CLIKSEAL STRL BL (MISCELLANEOUS) ×5 IMPLANT
COVER SURGICAL LIGHT HANDLE (MISCELLANEOUS) ×3 IMPLANT
COVER TRANSDUCER ULTRASND GEL (DRAPE) ×3 IMPLANT
DEVICE DISSECT PLASMABLAD 3.0S (MISCELLANEOUS) ×1 IMPLANT
DRAIN CHANNEL 19F RND (DRAIN) ×3 IMPLANT
DRAPE UTILITY XL STRL (DRAPES) ×6 IMPLANT
DRSG PAD ABDOMINAL 8X10 ST (GAUZE/BANDAGES/DRESSINGS) ×2 IMPLANT
ELECT CAUTERY BLADE 6.4 (BLADE) ×3 IMPLANT
ELECT REM PT RETURN 9FT ADLT (ELECTROSURGICAL) ×6
ELECTRODE REM PT RTRN 9FT ADLT (ELECTROSURGICAL) ×2 IMPLANT
EVACUATOR SILICONE 100CC (DRAIN) ×3 IMPLANT
GLOVE BIO SURGEON STRL SZ 6 (GLOVE) ×3 IMPLANT
GLOVE BIO SURGEON STRL SZ7 (GLOVE) ×8 IMPLANT
GLOVE BIOGEL PI IND STRL 6.5 (GLOVE) ×1 IMPLANT
GLOVE BIOGEL PI INDICATOR 6.5 (GLOVE) ×2
GOWN STRL REUS W/ TWL LRG LVL3 (GOWN DISPOSABLE) ×2 IMPLANT
GOWN STRL REUS W/TWL 2XL LVL3 (GOWN DISPOSABLE) ×3 IMPLANT
GOWN STRL REUS W/TWL LRG LVL3 (GOWN DISPOSABLE) ×6
KIT BASIN OR (CUSTOM PROCEDURE TRAY) ×3 IMPLANT
KIT ROOM TURNOVER OR (KITS) ×3 IMPLANT
LIQUID BAND (GAUZE/BANDAGES/DRESSINGS) ×2 IMPLANT
MARKER SKIN DUAL TIP RULER LAB (MISCELLANEOUS) ×3 IMPLANT
NDL 18GX1X1/2 (RX/OR ONLY) (NEEDLE) ×1 IMPLANT
NDL HYPO 25GX1X1/2 BEV (NEEDLE) ×1 IMPLANT
NDL SPNL 22GX3.5 QUINCKE BK (NEEDLE) ×1 IMPLANT
NEEDLE 18GX1X1/2 (RX/OR ONLY) (NEEDLE) ×3 IMPLANT
NEEDLE HYPO 25GX1X1/2 BEV (NEEDLE) ×3 IMPLANT
NEEDLE SPNL 22GX3.5 QUINCKE BK (NEEDLE) ×3 IMPLANT
NS IRRIG 1000ML POUR BTL (IV SOLUTION) ×3 IMPLANT
PACK GENERAL/GYN (CUSTOM PROCEDURE TRAY) ×3 IMPLANT
PACK UNIVERSAL I (CUSTOM PROCEDURE TRAY) ×3 IMPLANT
PAD ARMBOARD 7.5X6 YLW CONV (MISCELLANEOUS) ×3 IMPLANT
PLASMABLADE 3.0S (MISCELLANEOUS) ×3
SPECIMEN JAR MEDIUM (MISCELLANEOUS) ×2 IMPLANT
SPECIMEN JAR X LARGE (MISCELLANEOUS) ×3 IMPLANT
SPONGE GAUZE 4X4 12PLY STER LF (GAUZE/BANDAGES/DRESSINGS) ×3 IMPLANT
SPONGE LAP 18X18 X RAY DECT (DISPOSABLE) ×2 IMPLANT
STAPLER VISISTAT 35W (STAPLE) ×3 IMPLANT
STOCKINETTE IMPERVIOUS 9X36 MD (GAUZE/BANDAGES/DRESSINGS) ×3 IMPLANT
STRIP CLOSURE SKIN 1/2X4 (GAUZE/BANDAGES/DRESSINGS) ×3 IMPLANT
SUT ETHILON 2 0 FS 18 (SUTURE) ×3 IMPLANT
SUT MON AB 4-0 PC3 18 (SUTURE) ×5 IMPLANT
SUT SILK 2 0 (SUTURE) ×3
SUT SILK 2 0 FS (SUTURE) ×3 IMPLANT
SUT SILK 2-0 18XBRD TIE 12 (SUTURE) ×1 IMPLANT
SUT VIC AB 3-0 SH 8-18 (SUTURE) ×5 IMPLANT
SYR 50ML LL SCALE MARK (SYRINGE) ×3 IMPLANT
SYR CONTROL 10ML LL (SYRINGE) ×3 IMPLANT
TOWEL OR 17X24 6PK STRL BLUE (TOWEL DISPOSABLE) ×3 IMPLANT
TOWEL OR 17X26 10 PK STRL BLUE (TOWEL DISPOSABLE) ×3 IMPLANT
TUBE CONNECTING 12'X1/4 (SUCTIONS) ×1
TUBE CONNECTING 12X1/4 (SUCTIONS) ×2 IMPLANT

## 2014-05-08 NOTE — Interval H&P Note (Signed)
History and Physical Interval Note:  05/08/2014 7:19 AM  Rachel [name]  has presented today for surgery, with the diagnosis of LEFT BREAST CANCER  The various methods of treatment have been discussed with the patient and family. After consideration of risks, benefits and other options for treatment, the patient has consented to  Procedure(s): LEFT MASTECTOMY WITH SENTINEL LYMPH NODE BIOPSY (Left) as a surgical intervention .  The patient's history has been reviewed, patient examined, no change in status, stable for surgery.  I have reviewed the patient's chart and labs.  Questions were answered to the patient's satisfaction.     Emmit Oriley

## 2014-05-08 NOTE — Anesthesia Preprocedure Evaluation (Signed)
Anesthesia Evaluation  Patient identified by MRN, date of birth, ID band Patient awake    Reviewed: Allergy & Precautions, NPO status , Patient's Chart, lab work & pertinent test results  Airway Mallampati: II  TM Distance: >3 FB Neck ROM: Full    Dental no notable dental hx.    Pulmonary neg pulmonary ROS,  breath sounds clear to auscultation  Pulmonary exam normal       Cardiovascular hypertension, Rhythm:Regular Rate:Normal     Neuro/Psych negative neurological ROS  negative psych ROS   GI/Hepatic negative GI ROS, Neg liver ROS,   Endo/Other  diabetes  Renal/GU negative Renal ROS  negative genitourinary   Musculoskeletal negative musculoskeletal ROS (+)   Abdominal   Peds negative pediatric ROS (+)  Hematology negative hematology ROS (+)   Anesthesia Other Findings   Reproductive/Obstetrics negative OB ROS                             Anesthesia Physical Anesthesia Plan  ASA: II  Anesthesia Plan: General   Post-op Pain Management:    Induction: Intravenous  Airway Management Planned: Oral ETT and LMA  Additional Equipment:   Intra-op Plan:   Post-operative Plan: Extubation in OR  Informed Consent: I have reviewed the patients History and Physical, chart, labs and discussed the procedure including the risks, benefits and alternatives for the proposed anesthesia with the patient or authorized representative who has indicated his/her understanding and acceptance.   Dental advisory given  Plan Discussed with: CRNA and Surgeon  Anesthesia Plan Comments:         Anesthesia Quick Evaluation

## 2014-05-08 NOTE — H&P (View-Only) (Signed)
Rachel [name] 04/09/2014 3:21 PM Location: [facility] Patient #: [id number] DOB: 1955/07/04 Married / Language: English / Race: Black or African American Female  History of Present Illness [name] MD; 04/09/2014 3:50 PM) Patient words: Would like to speak about possible surgery on LT Breast.  The patient is a 59 year old female who presents with breast cancer. Patient is a 59 year old female who presented this summer with left-sided invasive ductal carcinoma. The ultrasound and mammogram demonstrated a 1.7 cm mass however the MRI showed this to be more like 5.1 cm. She did get biopsies of the anterior and posterior aspects of the tumor which were positive for cancer. There was a suspicious node on mammogram, but the ultrasound was negative sentinel lymph node was hot biopsied. 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 She has been receiving [place] P She has completed her preoperative therapy. She does have to get Herceptin for the rest of the year. Her recent MRI demonstrated a significant decrease in enhancement. The total size was still 4.1 cm. The patient does not want to pursue breast conservation and would like to get a mastectomy.   Other Problems Festus Holts, LPN; 0/12/2328 0:76 PM) Breast Cancer Diabetes Mellitus High blood pressure Oophorectomy  Past Surgical History Festus Holts, LPN; 05/08/6331 5:45 PM) Breast Biopsy Left. Hysterectomy (not due to cancer) - Complete  Diagnostic Studies History Festus Holts, LPN; 09/04/5636 9:37 PM) Colonoscopy 5-10 years ago Mammogram within last year  Allergies Festus Holts, LPN; 06/07/2874 8:11 PM) Eggs Sulfa Antibiotics Tetanus Toxoids MultiHance *DIAGNOSTIC PRODUCTS* Penicillins  Medication History Festus Holts, LPN; 08/10/2618 3:55 PM) Hydrochlorothiazide (25MG Tablet, Oral) Active. Multivitamins (Oral) Active. Citracal Maximum (315-250MG-UNIT Tablet, Oral) Active. EMLA (2.5-2.5%  Cream, External prn) Active.  Social History Festus Holts, LPN; 12/10/4161 8:45 PM) No alcohol use No caffeine use No drug use Tobacco use Never smoker.  Family History Festus Holts, LPN; 06/09/4678 3:21 PM) Cerebrovascular Accident Mother, Sister. Diabetes Mellitus Father, Mother. Heart disease in female family member before age 1 Hypertension Mother.  Pregnancy / Birth History Festus Holts, LPN; 05/08/4823 0:03 PM) Age at menarche 65 years. Gravida 2 Maternal age 19-25 Para 1  Review of Systems Festus Holts LPN; 7/0/4888 9:16 PM) General Not Present- Appetite Loss, Chills, Fatigue, Fever, Night Sweats, Weight Gain and Weight Loss. Skin Not Present- Change in Wart/Mole, Dryness, Hives, Jaundice, New Lesions, Non-Healing Wounds, Rash and Ulcer. HEENT Present- Wears glasses/contact lenses. Not Present- Earache, Hearing Loss, Hoarseness, Nose Bleed, Oral Ulcers, Ringing in the Ears, Seasonal Allergies, Sinus Pain, Sore Throat, Visual Disturbances and Yellow Eyes. Respiratory Not Present- Bloody sputum, Chronic Cough, Difficulty Breathing, Snoring and Wheezing. Cardiovascular Not Present- Chest Pain, Difficulty Breathing Lying Down, Leg Cramps, Palpitations, Rapid Heart Rate, Shortness of Breath and Swelling of Extremities. Gastrointestinal Not Present- Abdominal Pain, Bloating, Bloody Stool, Change in Bowel Habits, Chronic diarrhea, Constipation, Difficulty Swallowing, Excessive gas, Gets full quickly at meals, Hemorrhoids, Indigestion, Nausea, Rectal Pain and Vomiting. Female Genitourinary Not Present- Frequency, Nocturia, Painful Urination, Pelvic Pain and Urgency. Psychiatric Not Present- Anxiety, Bipolar, Change in Sleep Pattern, Depression, Fearful and Frequent crying. Endocrine Not Present- Cold Intolerance, Excessive Hunger, Hair Changes, Heat Intolerance, Hot flashes and New Diabetes. Hematology Not Present- Easy Bruising, Excessive bleeding, Gland problems, HIV  and Persistent Infections.   Vitals Festus Holts LPN; 12/07/5036 8:82 PM) 04/09/2014 3:25 PM Weight: 181.38 lb Height: 65in Body Surface Area: 1.94 m Body Mass Index: 30.18 kg/m Temp.: 98.18F(Temporal)  Pulse: 76 (Regular)  Resp.: 18 (Unlabored)  BP: 144/92 (Sitting, Left Arm, Standard)    Physical Exam [name] MD; 04/09/2014 3:51 PM) General Mental Status-Alert. General Appearance-Consistent with stated age. Hydration-Well hydrated. Voice-Normal.  Head and Neck Head-normocephalic, atraumatic with no lesions or palpable masses.  Eye Sclera/Conjunctiva - Bilateral-No scleral icterus.  Chest and Lung Exam Chest and lung exam reveals -quiet, even and easy respiratory effort with no use of accessory muscles. Inspection Chest Wall - Normal. Back - normal.  Breast Note: No palpable mass in the left breast. no lymphadenopathy.   Cardiovascular Cardiovascular examination reveals -normal pedal pulses bilaterally. Note: regular rate and rhythm  Abdomen Inspection-Inspection Normal. Palpation/Percussion Palpation and Percussion of the abdomen reveal - Soft, Non Tender, No Rebound tenderness, No Rigidity (guarding) and No hepatosplenomegaly. Auscultation Auscultation of the abdomen reveals - Bowel sounds normal.  Peripheral Vascular Upper Extremity Inspection - Bilateral - Normal - No Clubbing, No Cyanosis, No Edema, Pulses Intact. Lower Extremity Palpation - Edema - Bilateral - No edema.  Neurologic Neurologic evaluation reveals -alert and oriented x 3 with no impairment of recent or remote memory. Mental Status-Normal.  Musculoskeletal Global Assessment -Note: no gross deformities.  Normal Exam - Left-Upper Extremity Strength Normal and Lower Extremity Strength Normal. Normal Exam - Right-Upper Extremity Strength Normal and Lower Extremity Strength Normal.  Lymphatic Head & Neck  General Head & Neck Lymphatics:  Bilateral - Description - Normal. Axillary  General Axillary Region: Bilateral - Description - Normal. Tenderness - Non Tender.    Assessment & Plan [name] MD; 04/09/2014 3:52 PM) PRIMARY CANCER OF UPPER OUTER QUADRANT OF LEFT FEMALE BREAST (174.4  C50.412) Impression: We will plan a left mastectomy with sentinel lymph node biopsy. I have discussed the risks of the surgery including bleeding, infection, damage to adjacent structures, pain, heart or lung issues. She is likely to need radiation based on the size of her tumor and the likelihood that her lymph node is involved. She would be a candidate for delayed reconstruction.  I think a mastectomy as a good decision for her because with the bracketed needle loc there is a frequent incidence of positive margins. She would be likely to require multiple re-excisions and perhaps a mastectomy anyway. She also does not have large breasts and would have a significant size discrepancy with the lumpectomy.  We will schedule this at the first available opportunity. Current Plans  Pt Education - [facility] Mastectomy [facility] Schedule for Surgery   Signed by [name], MD (04/09/2014 3:53 PM)

## 2014-05-08 NOTE — Progress Notes (Signed)
Patient arrived from PACU. She is alert, denied any distress or discomfort. She denied any nausea. S/e of pain medication reviewed with patient. Bowel sounds present. Oral fluid tolerated. Diet advanced. Safety precautions reviewed with patient/family. Call light and possessions at bedside. Husband is now at bedside. Will continue to monitor.   Ave Filter, RN

## 2014-05-08 NOTE — Discharge Instructions (Signed)
[facility] [place] °[phone] ° °MASTECTOMY: POST OP INSTRUCTIONS ° °Always review your discharge instruction sheet given to you by the facility where your surgery was performed. °IF YOU HAVE DISABILITY OR FAMILY LEAVE FORMS, YOU MUST BRING THEM TO THE OFFICE FOR PROCESSING.   °DO NOT GIVE THEM TO YOUR DOCTOR. °A prescription for pain medication may be given to you upon discharge.  Take your pain medication as prescribed, if needed.  If narcotic pain medicine is not needed, then you may take acetaminophen (Tylenol) or ibuprofen (Advil) as needed. °1. Take your usually prescribed medications unless otherwise directed. °2. If you need a refill on your pain medication, please contact your pharmacy.  They will contact our office to request authorization.  Prescriptions will not be filled after 5pm or on week-ends. °3. You should follow a light diet the first few days after arrival home, such as soup and crackers, etc.  Resume your normal diet the day after surgery. °4. Most patients will experience some swelling and bruising on the chest and underarm.  Ice packs will help.  Swelling and bruising can take several days to resolve.  °5. It is common to experience some constipation if taking pain medication after surgery.  Increasing fluid intake and taking a stool softener (such as Colace) will usually help or prevent this problem from occurring.  A mild laxative (Milk of Magnesia or Miralax) should be taken according to package instructions if there are no bowel movements after 48 hours. °6. Unless discharge instructions indicate otherwise, leave your bandage dry and in place until your next appointment in 3-5 days.  You may take a limited sponge bath.  No tube baths or showers until the drains are removed.  You may have steri-strips (small skin tapes) in place directly over the incision.  These strips should be left on the skin for 7-10 days.  If your surgeon used skin glue on the incision, you may  shower in 24 hours.  The glue will flake off over the next 2-3 weeks.  Any sutures or staples will be removed at the office during your follow-up visit. °7. DRAINS:  If you have drains in place, it is important to keep a list of the amount of drainage produced each day in your drains.  Before leaving the hospital, you should be instructed on drain care.  Call our office if you have any questions about your drains. °8. ACTIVITIES:  You may resume regular (light) daily activities beginning the next day--such as daily self-care, walking, climbing stairs--gradually increasing activities as tolerated.  You may have sexual intercourse when it is comfortable.  Refrain from any heavy lifting or straining until approved by your doctor. °a. You may drive when you are no longer taking prescription pain medication, you can comfortably wear a seatbelt, and you can safely maneuver your car and apply brakes. °b. RETURN TO WORK:  __________________________________________________________ °9. You should see your doctor in the office for a follow-up appointment approximately 3-5 days after your surgery.  Your doctor’s nurse will typically make your follow-up appointment when she calls you with your pathology report.  Expect your pathology report 2-3 business days after your surgery.  You may call to check if you do not hear from us after three days.   °10. OTHER INSTRUCTIONS: ______________________________________________________________________________________________ ____________________________________________________________________________________________ °WHEN TO CALL YOUR DOCTOR: °1. Fever over 101.0 °2. Nausea and/or vomiting °3. Extreme swelling or bruising °4. Continued bleeding from incision. °5. Increased pain, redness, or drainage from the incision. °  The clinic staff is available to answer your questions during regular business hours.  Please don’t hesitate to call and ask to speak to one of the nurses for clinical  concerns.  If you have a medical emergency, go to the nearest emergency room or call 911.  A surgeon from [facility] is always on call at the hospital. °[address], [address], [place], [place]  [address] ? [address] [address], , [place]   [address] °[phone] ? [phone] ? FAX [phone] °Web site: [email or web address] °

## 2014-05-08 NOTE — Transfer of Care (Signed)
Immediate Anesthesia Transfer of Care Note  Patient: Rachel [name]  Procedure(s) Performed: Procedure(s): LEFT MASTECTOMY WITH SENTINEL LYMPH NODE BIOPSY (Left)  Patient Location: PACU  Anesthesia Type:General  Level of Consciousness: awake, alert , oriented and patient cooperative  Airway & Oxygen Therapy: Patient Spontanous Breathing and Patient connected to nasal cannula oxygen  Post-op Assessment: Report given to RN, Post -op Vital signs reviewed and stable and Patient moving all extremities X 4  Post vital signs: Reviewed and stable  Last Vitals:  Filed Vitals:   05/08/14 0641  BP: 129/70  Pulse: 71  Temp: 37 C  Resp: 18    Complications: No apparent anesthesia complications

## 2014-05-08 NOTE — Anesthesia Postprocedure Evaluation (Signed)
  Anesthesia Post-op Note  Patient: Rachel [name]  Procedure(s) Performed: Procedure(s): LEFT MASTECTOMY WITH SENTINEL LYMPH NODE BIOPSY (Left)  Patient Location: PACU  Anesthesia Type:General  Level of Consciousness: awake and alert   Airway and Oxygen Therapy: Patient Spontanous Breathing  Post-op Pain: none  Post-op Assessment: Post-op Vital signs reviewed  Post-op Vital Signs: Reviewed  Last Vitals:  Filed Vitals:   05/08/14 1324  BP: 125/62  Pulse: 63  Temp: 36.6 C  Resp: 16    Complications: No apparent anesthesia complications

## 2014-05-08 NOTE — Op Note (Signed)
Left Mastectomy with Sentinel Node Biopsy Procedure Note  Indications: This patient presents with history of left breast cancer with clinically negative axillary lymph node exam, s/p neoadjuvant chemotherapy.  Pre-operative Diagnosis: left breast cancer, [id number]  Post-operative Diagnosis: left breast cancer, same  Surgeon: [name]   Assistant:  Chester Holstein PA-S  Anesthesia: General endotracheal anesthesia and Local anesthesia 0.25.% bupivacaine, with epinephrine  Procedure Details  The patient was seen in the Holding Room. The risks, benefits, complications, treatment options, and expected outcomes were discussed with the patient. The possibilities of reaction to medication, pulmonary aspiration, bleeding, infection, the need for additional procedures, failure to diagnose a condition, and creating a complication requiring transfusion or operation were discussed with the patient. The patient concurred with the proposed plan, giving informed consent.  The site of surgery properly noted/marked. The patient was taken to Operating Room # 1, identified as Rachel [name] and the procedure verified as Left Mastectomy and Sentinel Node Biopsy. A Time Out was held and the above information confirmed.    After induction of anesthesia, the left arm, breast, and chest were prepped and draped in standard fashion.   The borders of the breast were identified and marked.  The incisions of the breast were drawn out to make sure incision lines were equidistant in length.  The superior incision was made with the PlasmaBlade.  The marcaine/saline mixture was infiltrated into the superior flap.  Mastectomy hooks were used to provide elevation of the skin edges, and the PlasmaBlade was used to create the mastectomy flaps.  The dissection was taken to the fascia of the pectoralis major.  The penetrating vessels were clipped.  The superior flap was taken medially to the lateral sternal border, superiorly  to the inferior border of the clavicle.  The inferior flap was similarly created, inferiorly to the inframammary fold and laterally to the border of the latissimus.  The breast was taken off including the pectoralis fascia and the axillary tail marked.    Using a hand-held gamma probe, axillary sentinel nodes were identified.  Two level 2 axillary sentinel nodes were removed and submitted to pathology.  The findings are below.  The lymphovascular channels were clipped with metal clips.        The wound was irrigated.  One 19 Blake drain was placed laterally.   Hemostasis was achieved with cautery.  The wound was irrigated and closed with a 3-0 Vicryl deep dermal interrupted sutures and 4-0 Vicryl subcuticular closure in layers.    Sterile dressings were applied. At the end of the operation, all sponge, instrument, and needle counts were correct.  Findings: grossly clear surgical margins, cps SLN #1 290; cps SLN #2 580, Background count 5  Estimated Blood Loss:  less than 50 mL         Drains: One 19 Blake drain                Specimens: L breast and 2 axillary sentinel nodes         Complications:  None; patient tolerated the procedure well.         Disposition: PACU - hemodynamically stable.         Condition: stable

## 2014-05-09 ENCOUNTER — Encounter (HOSPITAL_COMMUNITY): Payer: Self-pay | Admitting: General Surgery

## 2014-05-09 DIAGNOSIS — C50912 Malignant neoplasm of unspecified site of left female breast: Secondary | ICD-10-CM | POA: Diagnosis not present

## 2014-05-09 LAB — CBC
HCT: 27.3 % — ABNORMAL LOW (ref 36.0–46.0)
HEMATOCRIT: 28 % — AB (ref 36.0–46.0)
HEMOGLOBIN: 9.4 g/dL — AB (ref 12.0–15.0)
Hemoglobin: 9.1 g/dL — ABNORMAL LOW (ref 12.0–15.0)
MCH: 30.8 pg (ref 26.0–34.0)
MCH: 30.9 pg (ref 26.0–34.0)
MCHC: 33.3 g/dL (ref 30.0–36.0)
MCHC: 33.6 g/dL (ref 30.0–36.0)
MCV: 92.5 fL (ref 78.0–100.0)
MCV: 92.1 fL (ref 78.0–100.0)
PLATELETS: 120 10*3/uL — AB (ref 150–400)
Platelets: 135 10*3/uL — ABNORMAL LOW (ref 150–400)
RBC: 2.95 MIL/uL — ABNORMAL LOW (ref 3.87–5.11)
RBC: 3.04 MIL/uL — ABNORMAL LOW (ref 3.87–5.11)
RDW: 14.6 % (ref 11.5–15.5)
RDW: 14.5 % (ref 11.5–15.5)
WBC: 4.2 10*3/uL (ref 4.0–10.5)
WBC: 4.7 10*3/uL (ref 4.0–10.5)

## 2014-05-09 LAB — BASIC METABOLIC PANEL
Anion gap: 6 (ref 5–15)
BUN: 14 mg/dL (ref 6–23)
CALCIUM: 8.2 mg/dL — AB (ref 8.4–10.5)
CO2: 26 mmol/L (ref 19–32)
Chloride: 107 mmol/L (ref 96–112)
Creatinine, Ser: 1.05 mg/dL (ref 0.50–1.10)
GFR, EST AFRICAN AMERICAN: 66 mL/min — AB (ref >90)
GFR, EST NON AFRICAN AMERICAN: 57 mL/min — AB (ref >90)
Glucose, Bld: 162 mg/dL — ABNORMAL HIGH (ref 70–99)
Potassium: 3.9 mmol/L (ref 3.5–5.1)
SODIUM: 139 mmol/L (ref 135–145)

## 2014-05-09 LAB — GLUCOSE, CAPILLARY: GLUCOSE-CAPILLARY: 141 mg/dL — AB (ref 70–99)

## 2014-05-09 MED ORDER — SODIUM CHLORIDE 0.9 % IV BOLUS (SEPSIS)
250.0000 mL | Freq: Once | INTRAVENOUS | Status: AC
  Administered 2014-05-09: 250 mL via INTRAVENOUS

## 2014-05-09 MED ORDER — HYDROCODONE-ACETAMINOPHEN 5-325 MG PO TABS: 1.0000 | ORAL_TABLET | ORAL | Status: DC | PRN

## 2014-05-09 NOTE — Progress Notes (Signed)
Rachel [name] to be D/C'd Home per MD order.  Discussed with the patient and all questions fully answered.  VSS, Surgical site clean, dry, intact with ABD and breast binder in place.   IV catheter discontinued intact. Site without signs and symptoms of complications. Dressing and pressure applied.  An After Visit Summary was printed and given to the patient. Discussed care of JP drain and importance of recording drainage for follow up appointment with patient.  Patient received prescription.  D/C education completed with patient/family including follow up instructions, medication list, d/c activities limitations if indicated, with other d/c instructions as indicated by MD - patient able to verbalize understanding, all questions fully answered.   Patient instructed to return to ED, call 911, or call MD for any changes in condition.   Patient escorted via [place], and D/C home via private auto.  [name] 05/09/2014 11:57 AM

## 2014-05-09 NOTE — Discharge Summary (Signed)
Physician Discharge Summary  Patient ID: Rachel [name] MRN: [id number] DOB/AGE: 1955-05-11 59 y.o.  Admit date: 05/08/2014 Discharge date: 05/09/2014  Admission Diagnoses: Left breast cancer  Discharge Diagnoses: Left breast cancer Active Problems:   Breast cancer, female   Discharged Condition: good  Hospital Course: Pt admitted s/p left mastectomy with SLN biopsy for left breast cancer. She struggled with some syncope and blood loss, but hematocrit was stable. Patient is alert, in no pain and improved from last night. Patient is ready for discharge.  Significant Diagnostic Studies: labs: HCT 228.0 at 5:55am on 05/09/14.  Treatments: IV hydration, analgesics, and surgery (mastectomy)  Discharge Exam: Blood pressure 129/66, pulse 87, temperature 98.4 F (36.9 C), temperature source Oral, resp. rate 16, height 5\' 5"  (1.651 m), weight 173 lb 3.1 oz (78.56 kg), SpO2 100 %.   General appearance: alert, cooperative and no distress Resp: breathing comfortably Breasts: normal appearance, Incision c/d/i, JP drain intact, no fluid buildup under skin   Disposition: 01-Home or Self Care     Medication List    ASK your doctor about these medications        acetaminophen 500 MG tablet  Commonly known as:  TYLENOL  Take 500 mg by mouth every 6 (six) hours as needed (pain).     calcium citrate-vitamin D 315-200 MG-UNIT per tablet  Commonly known as:  CITRACAL+D  Take 1 tablet by mouth daily.     hydrochlorothiazide 25 MG tablet  Commonly known as:  HYDRODIURIL  Take 25 mg by mouth daily.     HYDROcodone-acetaminophen 5-325 MG per tablet  Commonly known as:  NORCO  Take 1-2 tablets by mouth every 4 (four) hours as needed for moderate pain or severe pain.     hydrocortisone cream 1 %  Apply 1 application topically 2 (two) times daily as needed for itching (rash).     lidocaine-prilocaine cream  Commonly known as:  EMLA  Apply one application to port-a-cath 1 - 2 hours prior  to access.     multivitamin with minerals Tabs tablet  Take 1 tablet by mouth daily.     promethazine 25 MG tablet  Commonly known as:  PHENERGAN  Take 25 mg by mouth as needed for nausea or vomiting (prior to MRI).           Follow-up Information    Follow up with [facility], MD In 2 weeks.   Specialty:  General Surgery   Contact information:   [address] [place] [address] [phone]       Signed: Chester Holstein 05/09/2014, 11:51 AM

## 2014-05-09 NOTE — Significant Event (Signed)
Rapid Response Event Note Called per floor RN regarding patient loss of consciousness following trip to bathroom. Pt maintained pulse and respirations but would not respond to voice or painful stimuli for about 2-4 minutes. Pt awakened, VS checked revealing low BP. NS bolus started just prior to my arrival.   Overview: Time Called: 0050 Arrival Time: 0100 Event Type: Neurologic, Hypotension, Cardiac  Initial Focused Assessment: Pt found resting in bed alert oriented anxious. Denies pain but complains of mild dizziness. BP soft and reactive to fluids.   Interventions: Lab called for AM labs CBC and BMET to be drawn now. Dr.Wyatt on call paged and updated on pt status. NS bolus (already in process) ordered as well as Stat H/H. MD to be called once labs resulted this AM. Pt repositioned in bed and updated on plan of care. Pt requesting husband to come for support, Spouse called and updated by myself, now en route to [facility]. Pt left resting in bed. RRT to follow.  Event Summary: Name of Physician Notified: Dr. Hulen Skains on call Surgeon at 0100    at    Outcome: Stayed in room and stabalized  Event End Time: [place], [place]

## 2014-05-14 ENCOUNTER — Telehealth (INDEPENDENT_AMBULATORY_CARE_PROVIDER_SITE_OTHER): Payer: Self-pay | Admitting: General Surgery

## 2014-05-14 NOTE — Telephone Encounter (Signed)
Reviewed pathology with patient.  Discussed positive lymph nodes.  Will need to get radiation and go back to see Dr. Valere Dross.

## 2014-05-14 NOTE — Addendum Note (Signed)
Addendum  created 05/14/14 2244 by Myrtie Soman, MD   Modules edited: Anesthesia Events, Narrator   Narrator:  Narrator: Event Log Edited

## 2014-05-20 ENCOUNTER — Encounter: Payer: Self-pay | Admitting: Hematology

## 2014-05-20 ENCOUNTER — Ambulatory Visit (HOSPITAL_BASED_OUTPATIENT_CLINIC_OR_DEPARTMENT_OTHER): Payer: Managed Care, Other (non HMO) | Admitting: Hematology

## 2014-05-20 ENCOUNTER — Telehealth: Payer: Self-pay | Admitting: Hematology

## 2014-05-20 ENCOUNTER — Other Ambulatory Visit (HOSPITAL_BASED_OUTPATIENT_CLINIC_OR_DEPARTMENT_OTHER): Payer: Managed Care, Other (non HMO)

## 2014-05-20 ENCOUNTER — Ambulatory Visit (HOSPITAL_BASED_OUTPATIENT_CLINIC_OR_DEPARTMENT_OTHER): Payer: Managed Care, Other (non HMO)

## 2014-05-20 ENCOUNTER — Ambulatory Visit: Payer: Managed Care, Other (non HMO)

## 2014-05-20 VITALS — BP 118/70 | HR 82 | Temp 98.3°F | Resp 18 | Ht 65.0 in | Wt 166.2 lb

## 2014-05-20 DIAGNOSIS — D6481 Anemia due to antineoplastic chemotherapy: Secondary | ICD-10-CM

## 2014-05-20 DIAGNOSIS — Z170 Estrogen receptor positive status [ER+]: Secondary | ICD-10-CM

## 2014-05-20 DIAGNOSIS — C50412 Malignant neoplasm of upper-outer quadrant of left female breast: Secondary | ICD-10-CM

## 2014-05-20 DIAGNOSIS — Z5112 Encounter for antineoplastic immunotherapy: Secondary | ICD-10-CM

## 2014-05-20 DIAGNOSIS — E119 Type 2 diabetes mellitus without complications: Secondary | ICD-10-CM

## 2014-05-20 LAB — COMPREHENSIVE METABOLIC PANEL (CC13)
ALK PHOS: 57 U/L (ref 40–150)
ALT: 12 U/L (ref 0–55)
ANION GAP: 8 meq/L (ref 3–11)
AST: 17 U/L (ref 5–34)
Albumin: 3.6 g/dL (ref 3.5–5.0)
BUN: 17.6 mg/dL (ref 7.0–26.0)
CALCIUM: 9.7 mg/dL (ref 8.4–10.4)
CHLORIDE: 106 meq/L (ref 98–109)
CO2: 31 mEq/L — ABNORMAL HIGH (ref 22–29)
Creatinine: 0.9 mg/dL (ref 0.6–1.1)
EGFR: 81 mL/min/{1.73_m2} — ABNORMAL LOW (ref >90)
Glucose: 103 mg/dl (ref 70–140)
POTASSIUM: 4.1 meq/L (ref 3.5–5.1)
Sodium: 144 mEq/L (ref 136–145)
Total Bilirubin: 0.76 mg/dL (ref 0.20–1.20)
Total Protein: 6.3 g/dL — ABNORMAL LOW (ref 6.4–8.3)

## 2014-05-20 LAB — CBC WITH DIFFERENTIAL/PLATELET
BASO%: 0.9 % (ref 0.0–2.0)
Basophils Absolute: 0 10*3/uL (ref 0.0–0.1)
EOS ABS: 0.1 10*3/uL (ref 0.0–0.5)
EOS%: 1.4 % (ref 0.0–7.0)
HEMATOCRIT: 29.8 % — AB (ref 34.8–46.6)
HGB: 9.7 g/dL — ABNORMAL LOW (ref 11.6–15.9)
LYMPH%: 29.1 % (ref 14.0–49.7)
MCH: 31 pg (ref 25.1–34.0)
MCHC: 32.4 g/dL (ref 31.5–36.0)
MCV: 95.8 fL (ref 79.5–101.0)
MONO#: 0.4 10*3/uL (ref 0.1–0.9)
MONO%: 10 % (ref 0.0–14.0)
NEUT#: 2.4 10*3/uL (ref 1.5–6.5)
NEUT%: 58.6 % (ref 38.4–76.8)
PLATELETS: 231 10*3/uL (ref 145–400)
RBC: 3.11 10*6/uL — AB (ref 3.70–5.45)
RDW: 14.5 % (ref 11.2–14.5)
WBC: 4.2 10*3/uL (ref 3.9–10.3)
lymph#: 1.2 10*3/uL (ref 0.9–3.3)

## 2014-05-20 MED ORDER — DIPHENHYDRAMINE HCL 25 MG PO CAPS
50.0000 mg | ORAL_CAPSULE | Freq: Once | ORAL | Status: AC
  Administered 2014-05-20: 50 mg via ORAL

## 2014-05-20 MED ORDER — TRASTUZUMAB CHEMO INJECTION 440 MG
6.0000 mg/kg | Freq: Once | INTRAVENOUS | Status: AC
  Administered 2014-05-20: 483 mg via INTRAVENOUS
  Filled 2014-05-20: qty 23

## 2014-05-20 MED ORDER — HEPARIN SOD (PORK) LOCK FLUSH 100 UNIT/ML IV SOLN
500.0000 [IU] | Freq: Once | INTRAVENOUS | Status: AC | PRN
  Administered 2014-05-20: 500 [IU]
  Filled 2014-05-20: qty 5

## 2014-05-20 MED ORDER — SODIUM CHLORIDE 0.9 % IJ SOLN
10.0000 mL | INTRAMUSCULAR | Status: DC | PRN
  Administered 2014-05-20: 10 mL
  Filled 2014-05-20: qty 10

## 2014-05-20 MED ORDER — LIDOCAINE-PRILOCAINE 2.5-2.5 % EX CREA
TOPICAL_CREAM | CUTANEOUS | Status: AC
  Filled 2014-05-20: qty 5

## 2014-05-20 MED ORDER — DIPHENHYDRAMINE HCL 25 MG PO CAPS
ORAL_CAPSULE | ORAL | Status: AC
  Filled 2014-05-20: qty 2

## 2014-05-20 MED ORDER — ACETAMINOPHEN 325 MG PO TABS
ORAL_TABLET | ORAL | Status: AC
  Filled 2014-05-20: qty 2

## 2014-05-20 MED ORDER — SODIUM CHLORIDE 0.9 % IV SOLN: Freq: Once | INTRAVENOUS | Status: DC

## 2014-05-20 MED ORDER — ACETAMINOPHEN 325 MG PO TABS
650.0000 mg | ORAL_TABLET | Freq: Once | ORAL | Status: AC
  Administered 2014-05-20: 650 mg via ORAL

## 2014-05-20 NOTE — Patient Instructions (Signed)
[facility] Discharge Instructions for Patients Receiving Chemotherapy  Today you received the following chemotherapy agents Herceptin.  To help prevent nausea and vomiting after your treatment, we encourage you to take your nausea medication as directed.   If you develop nausea and vomiting that is not controlled by your nausea medication, call the clinic.   BELOW ARE SYMPTOMS THAT SHOULD BE REPORTED IMMEDIATELY:  *FEVER GREATER THAN 100.5 F  *CHILLS WITH OR WITHOUT FEVER  NAUSEA AND VOMITING THAT IS NOT CONTROLLED WITH YOUR NAUSEA MEDICATION  *UNUSUAL SHORTNESS OF BREATH  *UNUSUAL BRUISING OR BLEEDING  TENDERNESS IN MOUTH AND THROAT WITH OR WITHOUT PRESENCE OF ULCERS  *URINARY PROBLEMS  *BOWEL PROBLEMS  UNUSUAL RASH Items with * indicate a potential emergency and should be followed up as soon as possible.  Feel free to call the clinic you have any questions or concerns. The clinic phone number is [phone].  

## 2014-05-20 NOTE — Telephone Encounter (Signed)
Gave avs & calendar for March.Sent message to auth ECHO

## 2014-05-20 NOTE — Progress Notes (Signed)
[facility] [place] OFFICE PROGRESS NOTE  Patient Care Team: [name], MD as PCP - General (Internal Medicine) [name], MD as Consulting Physician (General Surgery)  SUMMARY OF ONCOLOGIC HISTORY:   Breast cancer of upper-outer quadrant of left female breast   05/08/2013 Surgery left mastectomy and SLN biopsy, negative margins. Path showed 2 lesions 0.5cm an d1.4cm, [id number]   10/17/2013 Mammogram There is persistent distortion in the left upper outer quadrant, corresponding to the screening mammographic finding.    10/26/2013 Breast US Ultrasound is performed, showing a hypoechoic irregular mass with surrounding distortion in the left breast 2 o'clock location 4 cm from the nipple measuring 1.1 x 0.9 x 0.7 cm. This corresponds to the mammographic finding. 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 - CALCIFICATIONS IDENTIFIED   10/30/2013 Receptors her2 tumor molecular markers: ER 100%, POSITIVE, PR: 42%, POSITIVE, Ki67: 53%; HER2 (+) (ration 2.8, copy number 3.5)   11/05/2013 Breast MRI Irregular enhancing mass measuring 5.1 cm in the upper and upper-outer quadrants of the left breast. The MR enhancing mass is much larger than the mass seen mammographically or sonographically.    11/05/2013 Breast MRI Suspicious level 1 left axillary lymph node concerning for metastatic involving. But [place] negative, no node biopsy.    11/07/2013 Initial Diagnosis Breast cancer of upper-outer quadrant of left female breast   11/26/2013 - 03/11/2014 Chemotherapy 6 cycles of neoadjuvant chemotherapy with TCH-P (Taxotere, Carbolatin, Herceptin and Perjeta followed by Neulasta   04/08/2014 -  Chemotherapy Herceptin maintenance    05/08/2014 Surgery Left mastectomy, margins are negative. Surgical past showed multifocal invasive ductal carcinoma (0.5 cm, 1.4 cm), 2/2 SLN were  positive.     CURRENT THERAPY: Herceptin maintenance therapy started on 04/08/2014, every 3 weeks   INTERVAL HISTORY: She returns for follow up. She had left mastectomy on 05/09/2014 and did well with the surgery. She still has the drainage tube in, some discomfort at surgical site, for which she takes ibuprofen only. No fever or chill, she has good appetite an energy level.   REVIEW OF SYSTEMS:   Constitutional: Denies fevers, chills.  Eyes: Denies blurriness of vision Ears, nose, mouth, throat, and face: Denies mucositis or sore throat Respiratory: Denies cough, dyspnea or wheezes Cardiovascular: Denies palpitation, chest discomfort or lower extremity swelling Gastrointestinal:  Denies nausea, heartburn or change in bowel habits Skin: Denies abnormal skin rashes Lymphatics: Denies new lymphadenopathy or easy bruising Neurological:Denies numbness, tingling or new weaknesses Behavioral/Psych: Mood is stable, no new changes  All other systems were reviewed with the patient and are negative except those in history   MEDICAL HISTORY:  Past Medical History  Diagnosis Date  . Breast cancer   . Hypertension   . Alopecia areata   . Endometriosis   . Hypercholesteremia   . Diabetes mellitus without complication     controled by diet    SURGICAL HISTORY: Past Surgical History  Procedure Laterality Date  . Abdominal hysterectomy      BSO  . Colonoscopy    . Portacath placement N/A 11/20/2013    Procedure: INSERTION PORT-A-CATH;  Surgeon: [name], MD;  Location: [facility];  Service: General;  Laterality: N/A;  . Breast surgery Left 7/15    bx  . Mastectomy w/ sentinel node biopsy Left 05/08/2014    Procedure: LEFT MASTECTOMY WITH SENTINEL LYMPH NODE BIOPSY;  Surgeon: [name], MD;  Location: [facility];  Service: General;  Laterality: Left;    I have reviewed the social history and family history with the patient and they are unchanged from previous note.  ALLERGIES:  is allergic to  eggs or egg-derived products; nickel; sulfa antibiotics; neosporin; multihance; penicillins; and tetanus toxoids.  MEDICATIONS:    Medication List       This list is accurate as of: 05/20/14 12:39 PM.  Always use your most recent med list.               acetaminophen 500 MG tablet  Commonly known as:  TYLENOL  Take 500 mg by mouth every 6 (six) hours as needed (pain).     calcium citrate-vitamin D 315-200 MG-UNIT per tablet  Commonly known as:  CITRACAL+D  Take 1 tablet by mouth daily.     hydrochlorothiazide 25 MG tablet  Commonly known as:  HYDRODIURIL  Take 25 mg by mouth daily.     HYDROcodone-acetaminophen 5-325 MG per tablet  Commonly known as:  NORCO  Take 1-2 tablets by mouth every 4 (four) hours as needed for moderate pain or severe pain.     hydrocortisone cream 1 %  Apply 1 application topically 2 (two) times daily as needed for itching (rash).     lidocaine-prilocaine cream  Commonly known as:  EMLA  Apply one application to port-a-cath 1 - 2 hours prior to access.     multivitamin with minerals Tabs tablet  Take 1 tablet by mouth daily.     promethazine 25 MG tablet  Commonly known as:  PHENERGAN  Take 25 mg by mouth as needed for nausea or vomiting (prior to MRI).         PHYSICAL EXAMINATION: ECOG PERFORMANCE STATUS: 0  Filed Vitals:   05/20/14 0850  BP: 118/70  Pulse: 82  Temp: 98.3 F (36.8 C)  Resp: 18   Filed Weights   05/20/14 0850  Weight: 166 lb 3.2 oz (75.388 kg)    GENERAL:alert, no distress and comfortable SKIN: skin color, texture, turgor are normal, no rashes or significant lesions EYES: normal, Conjunctiva are pink and non-injected, sclera clear OROPHARYNX:no exudate, no erythema and lips, buccal mucosa, and tongue normal  NECK: supple, thyroid normal size, non-tender, without nodularity LYMPH:  no palpable lymphadenopathy in the cervical, axillary or inguinal LUNGS: clear to auscultation and percussion with normal  breathing effort HEART: regular rate & rhythm and no murmurs and no lower extremity edema ABDOMEN:abdomen soft, non-tender and normal bowel sounds Musculoskeletal:no cyanosis of digits and no clubbing  NEURO: alert & oriented x 3 with fluent speech, no focal motor/sensory deficits Breasts: Status post left mastectomy, surgical wound healing well, draining tube in place. Palpation of the right breasts and axilla revealed no obvious mass that I could appreciate, except some fullness at the upper out quadrant of left breast.. EXTREMITIES: (+) Thickening of the nailbeds, no discharge from the left middle finger nail bed, no surrounding skin edema or redness.   LABORATORY DATA:  I have reviewed the data as listed CBC Latest Ref Rng 05/20/2014 05/09/2014 05/09/2014  WBC 3.9 - 10.3 10e3/uL 4.2 4.7 4.2  Hemoglobin 11.6 - 15.9 g/dL 9.7(L) 9.4(L) 9.1(L)  Hematocrit 34.8 - 46.6 % 29.8(L) 28.0(L) 27.3(L)  Platelets 145 - 400 10e3/uL 231 135(L) 120(L)     CMP Latest Ref Rng 05/20/2014 05/09/2014 04/29/2014  Glucose 70 - 140 mg/dl 103 162(H) 79  BUN 7.0 - 26.0 mg/dL 17.6 14  21.1  Creatinine 0.6 - 1.1 mg/dL 0.9 1.05 0.9  Sodium 136 - 145 mEq/L 144 139 145  Potassium 3.5 - 5.1 mEq/L 4.1 3.9 4.1  Chloride 96 - 112 mmol/L - 107 -  CO2 22 - 29 mEq/L 31(H) 26 28  Calcium 8.4 - 10.4 mg/dL 9.7 8.2(L) 9.3  Total Protein 6.4 - 8.3 g/dL 6.3(L) - 6.4  Total Bilirubin 0.20 - 1.20 mg/dL 0.76 - 0.42  Alkaline Phos 40 - 150 U/L 57 - 58  AST 5 - 34 U/L 17 - 18  ALT 0 - 55 U/L 12 - 15   PATHOLOGY REPORT: Diagnosis 1. Breast, simple mastectomy, left 05/08/2014 - PENDING STAINS. - MULTIFOCAL INVASIVE DUCTAL CARCINOMA WITH NEOADJUVANT RELATED CHANGES, SEE COMMENT. - NEGATIVE FOR LYMPH VASCULAR INVASION. - INVASIVE TUMOR IS 4 MM FROM NEAREST MARGIN (ANTERIOR). - DUCTAL CARCINOMA IN SITU - PREVIOUS BIOPSY SITE. - BENIGN SKIN; NEGATIVE FOR TUMOR - SEE TUMOR SYNOPTIC TEMPLATE BELOW. 2. Lymph node, sentinel, biopsy, left  axillary #1 - ONE LYMPH NODE, POSITIVE FOR METASTATIC MAMMARY CARCINOMA (1/1). - INTRANODAL TUMOR DEPOSIT IS 2 MM - NEGATIVE FOR EXTRACAPSULAR TUMOR EXTENSION. 3. Lymph node, sentinel, biopsy, left axillary #2 - ONE LYMPH NODE, POSITIVE FOR METASTATIC MAMMARY CARCINOMA (1/1). - INTRANODAL TUMOR DEPOSIT IS 4 MM - NEGATIVE FOR EXTRACAPSULAR TUMOR EXTENSION.  Microscopic Comment 1. BREAST, INVASIVE TUMOR, WITH LYMPH NODES PRESENT Specimen, including laterality and lymph node sampling (sentinel, non-sentinel): Left breast Procedure: Simple mastectomy Histologic type: Ductal (x3 tumors), see comment. Grade: II of III (Tumor #1 and 2), see comment Tubule formation: 2 Nuclear pleomorphism: 3 Mitotic: 2 Grade: I of III (Tumor #3), see comment Tubule formation: 1 Nuclear pleomorphism: 1 Mitotic: 1 1 of 4 FINAL for ADAMS-FOUST, Rachel [name] ([id number]) Microscopic Comment(continued) Tumor size (glass slide measurement): 0.5 cm and 1.4 cm; gross measurement 2.0 cm, see comment. Margins: Invasive, distance to closest margin: 4 mm In-situ, distance to closest margin: 4 mm (anterior) If margin positive, focally or broadly: N/A Lymphovascular invasion: Absent Ductal carcinoma in situ: Present Grade: III of III Extensive intraductal component: Absent Lobular neoplasia: Pending stains Tumor focality: Multifocal Treatment effect: Present If present, treatment effect in breast tissue, lymph nodes or both: Tissue Extent of tumor: Skin: Negative Nipple: Negative Skeletal muscle: Negative Lymph nodes: Examined: 2 Sentinel 0 Non-sentinel 2 Total Lymph nodes with metastasis: 2 Isolated tumor cells (< 0.2 mm): 0 Micrometastasis: (> 0.2 mm and < 2.0 mm): 0 Macrometastasis: (> 2.0 mm): X 2 Extracapsular extension: Absent Breast prognostic profile: Estrogen receptor: Not repeated, previous study demonstrated 100% positivity and 100% positivity ([id number] and [id number]) Progesterone  receptor: Not repeated, previous study demonstrated 67%% and 42% positivity ([id number] and [id number]) Her 2 neu: Not repeated, previous study demonstrated amplification ([id number] and [id number]) Ki-67: Not repeated, previous study demonstrated 12% and 53% proliferation rate ([id number] and [id number]) Non-neoplastic breast: Previous biopsy site; fibrocystic change, usual ductal hyperplasia, calcifications and neoadjuvant related tissue changes. TNM: ympT1c, pN1a, pMX Comments: There are three different mass areas identified. The first central mass associated with barbell shaped clip demonstrates both in situ and invasive ductal carcinoma. Within the focus, the invasive ductal carcinoma spans 5 mm. The second posterior medial mass demonstrates both in situ and invasive ductal carcinoma spanning the entire 2.0 cm grossly identified stellate area. The third and final lateral mass with associated heart shaped clip demonstrates both in situ and invasive ductal carcinoma. The invasive tumor spans 1.4cm and is involved by and away from previous biopsy  site. The lack of the myoepithelial layer was confirmed with smooth muscle myosin heavy chain, Calponin, and p63 immunostains (slide 1K).. The presence of strong diffuse [name]-Cadherin expression and absence of cytokeratin 5/6 expression supports a ductal phenotype to the in situ carcinoma (slide 1K). (CR:kh 05-09-14) Mali RUND DO Pathologist, Electronic Signature (Case signed 05/13/2014) Specimen Gross and Clinical Information  RADIOGRAPHIC STUDIES: I have personally reviewed the radiological images as listed and agreed with the findings in the report.  BREAST MRI 04/03/2014  IMPRESSION: 1. There has been significant improvement in the non mass enhancement in the upper-outer quadrant of the left breast. Persistent faint enhancement is identified now measuring up to 4.4 cm. One of the biopsy sites is within the area of enhancement.  The second biopsy site is not associated with persistent enhancement on today's exam. 2. No suspicious lymph nodes identified on today's exam.   ASSESSMENT & PLAN:  This is a very pleasant 60 years-old Afro-american female from [facility] who presents with a left Invasive Ductal Carcinoma. This was picked on a mammogram and maximal diameter was 1.7 cm on Ultrasound however the MRI breast showed this mass to be large almost 5.1 cm in maximal dimension making it a clinical T3 tumor. Also it showed a suspicious looking solitary lymph node in axilla but [place] was negative, not biopsied.  Clinical T3N0M0 Stage IIB.  The tumor biology indicates a LUMINAL-B type which means ER/PR/HER2 + tumor. Tumor is strongly ER+100%, PR 42%,KI-67 Index 53% showing a brisk mitotic rate and grade is called grade 2 (but grading as per pathologist cannot be relied upon on initial biopsy).   1. 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  -her restaging breast MRI results showed good partial response -I discussed her surgical pathology findings with her in details. Unfortunately she had metastasis to 2 Sentinel lymph nodes. Axillary lymph node dissection was discussed, Dr. Barry Dienes, Dr. Valere Dross and me and we recommend to proceed ALND, which is the standard care for now. She agrees with the surgery. -I'll continue her Herceptin every 3 weeks to complete a total of one year treatment, through 11/2014 -She will start hormonal therapy after she completes adjuvant radiation -We'll monitor her heart function by repeating echo every 3 months. She is due on in 3 weeks, we'll do it before her next cycle of Herceptin.  2. Anemia, secondary to chemotherapy -improved  3. Grade 1 peripheral neuropathy, and a mild intermittent ankle swollen, nail changes.  Likely secondary to chemotherapy (docetaxel) -Continue  observation  4. HTN, DM -Follow up with primary care physician  Plan -cont herceptin q3w -repeat echo before next cycle of Herceptin  -RTC in 3 weeks -She will follow-up with Dr. Barry Dienes and Dr. Valere Dross  All questions were answered. The patient knows to call the clinic with any problems, questions or concerns. No barriers to learning was detected.  I spent 25 minutes counseling the patient face to face. The total time spent in the appointment was 30 minutes and more than 50% was on counseling and review of test results     Truitt Merle, MD 05/20/2014   12:39 PM

## 2014-05-21 ENCOUNTER — Telehealth: Payer: Self-pay | Admitting: Hematology

## 2014-05-21 NOTE — Telephone Encounter (Signed)
pt called to confirm appt....pt ok and aware °

## 2014-05-22 ENCOUNTER — Telehealth: Payer: Self-pay | Admitting: Hematology

## 2014-05-22 NOTE — Telephone Encounter (Signed)
Left message to confirm appointment for Echo.

## 2014-05-27 ENCOUNTER — Ambulatory Visit (HOSPITAL_COMMUNITY): Payer: Managed Care, Other (non HMO)

## 2014-05-29 ENCOUNTER — Encounter: Payer: Self-pay | Admitting: Radiation Oncology

## 2014-05-29 NOTE — Progress Notes (Signed)
Location of Breast Cancer:Left Breast, Upper Outer Quadrant   Histology per Pathology Report:   05/08/14 Diagnosis 1. Breast, simple mastectomy, left - PENDING STAINS. - MULTIFOCAL INVASIVE DUCTAL CARCINOMA WITH NEOADJUVANT RELATED CHANGES, SEE COMMENT. - NEGATIVE FOR LYMPH VASCULAR INVASION. - INVASIVE TUMOR IS 4 MM FROM NEAREST MARGIN (ANTERIOR). - DUCTAL CARCINOMA IN SITU - PREVIOUS BIOPSY SITE. - BENIGN SKIN; NEGATIVE FOR TUMOR - SEE TUMOR SYNOPTIC TEMPLATE BELOW. 2. Lymph node, sentinel, biopsy, left axillary #1 - ONE LYMPH NODE, POSITIVE FOR METASTATIC MAMMARY CARCINOMA (1/1). - INTRANODAL TUMOR DEPOSIT IS 2 MM - NEGATIVE FOR EXTRACAPSULAR TUMOR EXTENSION. 3. Lymph node, sentinel, biopsy, left axillary #2 - ONE LYMPH NODE, POSITIVE FOR METASTATIC MAMMARY CARCINOMA (1/1). - INTRANODAL TUMOR DEPOSIT IS 4 MM - NEGATIVE FOR EXTRACAPSULAR TUMOR EXTENSION.   11/09/13 Diagnosis Breast, left, needle core biopsy, mass, subareolar - INVASIVE MAMMARY CARCINOMA, SEE COMMENT. - MAMMARY CARCINOMA IN SITU.  Receptor Status: ER(100%), PR (42%), Her2-neu (Amplification 2.80), Ki-67(53%)  [name] had a screening mammogram on 10/17/2013 to [facility] and was found to have distortion in left breast and spot compression views and ultrasound was recommended. Mammography on 10/17/2013 showed distortion on the left breast upper outer quadrant. Ultrasound showed a 1.1 x 0.9 cm mass at 2:00. The axilla was benign on ultrasound. Biopsy on 10/30/2013 was diagnostic for invasive ductal/DCIS. ER positive at 100% and PR +42% with Ki-67 of 53%. HER-2/neu was amplified. Breast MR on 11/05/2013 showed 5.1 cm upper and upper-outer quadrant mass and area of enhancement along with a 9 mm level 1 left axillary lymph node that does not maintain a normal fatty hilum. It was felt to be concerning for metastatic involvement  Past/Anticipated interventions by surgeon, if any:Dr. Faera  Byerly - Lumpectomy, JP drain in with bloody drainage, emptying twice a day, this morning 20 cc; hematoma at surgical site near tubing, saw Dr Byerly last week and next appt is tomorrow, pt wearing compression top  Past/Anticipated interventions by medical oncology, if any: Chemotherapy Dr. Feng: Completed 6 cycle neoadjuvant TCH P regimen therapy 11/26/2013 - 03/11/2014 , Now on Herceptin   Lymphedema issues, if any: None     Pain issues, if any:   Post op soreness of left breast, not taking any medications for this  SAFETY ISSUES:  Prior radiation? No  Pacemaker/ICD? No  Possible current pregnancy? No  Is the patient on methotrexate? No  Current Complaints / other details:  She had 1 miscarriage, then 1 live birth, her first child was born at age 59. She received birth control pills and shots off and on for 20 years. She was never exposed to fertility medications but did take hormone replacement therapy (Premarin) for about 5 years ending in 2001. She has NO family history of Breast/GYN/GI cancer. Worked in food service, education Tingling in tips of fingers and toes, but is resolving.    Cheston, Cheryl Mintz, RN 05/29/2014,11:36 AM   

## 2014-05-30 ENCOUNTER — Ambulatory Visit
Admission: RE | Admit: 2014-05-30 | Discharge: 2014-05-30 | Disposition: A | Discharge status: Home / Self Care | Payer: Managed Care, Other (non HMO) | Source: Ambulatory Visit | Attending: Radiation Oncology | Admitting: Radiation Oncology

## 2014-05-30 ENCOUNTER — Ambulatory Visit (HOSPITAL_COMMUNITY)
Admission: RE | Admit: 2014-05-30 | Discharge: 2014-05-30 | Disposition: A | Discharge status: Home / Self Care | Payer: Managed Care, Other (non HMO) | Source: Ambulatory Visit | Attending: Hematology | Admitting: Hematology

## 2014-05-30 ENCOUNTER — Encounter: Payer: Self-pay | Admitting: Radiation Oncology

## 2014-05-30 VITALS — BP 112/71 | HR 90 | Temp 98.4°F | Resp 18 | Ht 65.0 in | Wt 164.2 lb

## 2014-05-30 DIAGNOSIS — Z9012 Acquired absence of left breast and nipple: Secondary | ICD-10-CM | POA: Diagnosis not present

## 2014-05-30 DIAGNOSIS — D0512 Intraductal carcinoma in situ of left breast: Secondary | ICD-10-CM | POA: Diagnosis not present

## 2014-05-30 DIAGNOSIS — C50412 Malignant neoplasm of upper-outer quadrant of left female breast: Secondary | ICD-10-CM | POA: Insufficient documentation

## 2014-05-30 DIAGNOSIS — C773 Secondary and unspecified malignant neoplasm of axilla and upper limb lymph nodes: Secondary | ICD-10-CM | POA: Insufficient documentation

## 2014-05-30 DIAGNOSIS — L539 Erythematous condition, unspecified: Secondary | ICD-10-CM | POA: Insufficient documentation

## 2014-05-30 DIAGNOSIS — T80219A Unspecified infection due to central venous catheter, initial encounter: Secondary | ICD-10-CM | POA: Diagnosis not present

## 2014-05-30 DIAGNOSIS — C50919 Malignant neoplasm of unspecified site of unspecified female breast: Secondary | ICD-10-CM

## 2014-05-30 DIAGNOSIS — Z510 Encounter for antineoplastic radiation therapy: Secondary | ICD-10-CM | POA: Diagnosis not present

## 2014-05-30 DIAGNOSIS — L814 Other melanin hyperpigmentation: Secondary | ICD-10-CM | POA: Diagnosis not present

## 2014-05-30 DIAGNOSIS — Z170 Estrogen receptor positive status [ER+]: Secondary | ICD-10-CM | POA: Diagnosis not present

## 2014-05-30 DIAGNOSIS — Z9221 Personal history of antineoplastic chemotherapy: Secondary | ICD-10-CM | POA: Insufficient documentation

## 2014-05-30 DIAGNOSIS — Z08 Encounter for follow-up examination after completed treatment for malignant neoplasm: Secondary | ICD-10-CM

## 2014-05-30 DIAGNOSIS — L598 Other specified disorders of the skin and subcutaneous tissue related to radiation: Secondary | ICD-10-CM | POA: Diagnosis not present

## 2014-05-30 DIAGNOSIS — B965 Pseudomonas (aeruginosa) (mallei) (pseudomallei) as the cause of diseases classified elsewhere: Secondary | ICD-10-CM | POA: Insufficient documentation

## 2014-05-30 HISTORY — PX: TRANSTHORACIC ECHOCARDIOGRAM: SHX275

## 2014-05-30 HISTORY — DX: Personal history of antineoplastic chemotherapy: Z92.21

## 2014-05-30 NOTE — Progress Notes (Signed)
Please see the Nurse Progress Note in the MD Initial Consult Encounter for this patient. 

## 2014-05-30 NOTE — Progress Notes (Signed)
  Echocardiogram 2D Echocardiogram has been performed.  Diamond Nickel 05/30/2014, 12:37 PM

## 2014-05-30 NOTE — Progress Notes (Signed)
CC: Dr. [name], Dr. Truitt Merle, Dr. Seward Carol  Diagnosis: Clinical stage IIB (T3 N0 M0), pathologic stage IIA (pT1 pN1 M0)  invasive ductal/DCIS of the left breast.  History: [name] is a pleasant 59 year old female who is seen today for review and scheduling of post mastectomy radiation therapy.  I first saw the patient at the breast multidisciplinary clinic on 11/07/2013. Mammography on 10/17/2013 showed distortion on the left breast upper outer quadrant. Ultrasound showed a 1.1 x 0.9 cm mass at 2:00. The axilla was benign on ultrasound. Biopsy on 10/30/2013 was diagnostic for invasive ductal/DCIS. ER positive at 100% and PR +42% with Ki-67 of 53%. HER-2/neu was amplified. Breast MR on 11/05/2013 showed 5.1 cm upper and upper-outer quadrant mass and area of enhancement along with a 9 mm level 1 left axillary lymph node that did not maintain a normal fatty hilum. It was felt to be concerning for metastatic involvement.  Ultrasound of the left axilla was negative and there was no node biopsy.  She went onto receive 6 cycles of anti-HER-2/neu  based neoadjuvant chemotherapy.  She underwent a left mastectomy and sentinel lymph node biopsy by Dr. Barry Dienes on 05/08/2014.  She was found to have residual multifocal invasive ductal carcinoma with the closest margin being 4 mm, anteriorly.  There were 2 tumors measuring 0.5 and 1.4 cm.  2 sentinel lymph nodes contain metastatic disease with deposits of 2 mm in one node and 4 mm and the other node.  She is doing well postoperatively although she does have a drain along her left chest wall for a hematoma.  Physical examination: Alert and oriented. Filed Vitals:   05/30/14 0730  BP: 112/71  Pulse: 90  Temp: 98.4 F (36.9 C)  Resp: 18   Head and neck examination: Grossly unremarkable except that she wears a wig.  Nodes: Without palpable cervical, supraclavicular, or axillary lymphadenopathy.  Chest: Lungs clear.  Left-sided mastectomy with  Steri-Strips covering her mastectomy wound.  There is a drain along the left lower anterior chest wall with a resolving hematoma.  Right breast without masses or lesions.  Extremities: Without edema.  Laboratory data: Lab Results  Component Value Date   WBC 4.2 05/20/2014   HGB 9.7* 05/20/2014   HCT 29.8* 05/20/2014   MCV 95.8 05/20/2014   PLT 231 05/20/2014   Impression:  Clinical stage IIB (T3 N0 M0), pathologic stage IIA (pT1 pN1 M0)  invasive ductal/DCIS of the left breast.  She would benefit from post mastectomy radiation therapy in view of her presenting clinical stage and nodal positivity.  We discussed the standard of care for management of her axilla and that would be an axillary node dissection.  She would like to avoid an axillary dissection, and this would not be unreasonable considering the current [employer] a 11202 trial which randomizes patients to radiation therapy alone or axillary dissection followed by radiation therapy in patients with a positive sentinel lymph node following neoadjuvant chemotherapy.  She would've been a candidate for this trial had we proven that she had nodal positivity at presentation.  With radiation only she may have a decreased risk for lymphedema.  We discussed the potential acute and late toxicities of radiation therapy.  We also discussed deep inspiration breath-hold technology to avoid the heart.  Consent is signed today.  She needs further time to heal, and I'll have her return for CT simulation in 2 weeks.  Plan: As above.  30 minutes was spent face-to-face with the  patient, primarily counseling patient and coordinating her care.

## 2014-05-31 ENCOUNTER — Telehealth: Payer: Self-pay | Admitting: *Deleted

## 2014-05-31 NOTE — Telephone Encounter (Signed)
Returned Rachel [name]'s call.  She wanted to know if following her radiation therapy, if she would require further biopsies to determine the effectiveness of the radiation therapy.  Informed her that the process s/p radiation therapy is

## 2014-06-01 ENCOUNTER — Emergency Department (HOSPITAL_COMMUNITY): Payer: Managed Care, Other (non HMO)

## 2014-06-01 ENCOUNTER — Encounter (HOSPITAL_COMMUNITY): Payer: Self-pay | Admitting: Cardiology

## 2014-06-01 ENCOUNTER — Emergency Department (HOSPITAL_COMMUNITY)
Admission: EM | Admit: 2014-06-01 | Discharge: 2014-06-02 | Disposition: A | Discharge status: Home / Self Care | Payer: Managed Care, Other (non HMO) | Attending: Emergency Medicine | Admitting: Emergency Medicine

## 2014-06-01 DIAGNOSIS — L03313 Cellulitis of chest wall: Secondary | ICD-10-CM | POA: Diagnosis not present

## 2014-06-01 DIAGNOSIS — R000 Tachycardia, unspecified: Secondary | ICD-10-CM | POA: Insufficient documentation

## 2014-06-01 DIAGNOSIS — Z880 Allergy status to penicillin: Secondary | ICD-10-CM | POA: Insufficient documentation

## 2014-06-01 DIAGNOSIS — Z853 Personal history of malignant neoplasm of breast: Secondary | ICD-10-CM | POA: Insufficient documentation

## 2014-06-01 DIAGNOSIS — L639 Alopecia areata, unspecified: Secondary | ICD-10-CM | POA: Insufficient documentation

## 2014-06-01 DIAGNOSIS — Z8742 Personal history of other diseases of the female genital tract: Secondary | ICD-10-CM | POA: Insufficient documentation

## 2014-06-01 DIAGNOSIS — R5082 Postprocedural fever: Secondary | ICD-10-CM | POA: Insufficient documentation

## 2014-06-01 DIAGNOSIS — R55 Syncope and collapse: Secondary | ICD-10-CM | POA: Insufficient documentation

## 2014-06-01 DIAGNOSIS — Z79899 Other long term (current) drug therapy: Secondary | ICD-10-CM | POA: Diagnosis not present

## 2014-06-01 DIAGNOSIS — E119 Type 2 diabetes mellitus without complications: Secondary | ICD-10-CM | POA: Insufficient documentation

## 2014-06-01 DIAGNOSIS — C50919 Malignant neoplasm of unspecified site of unspecified female breast: Secondary | ICD-10-CM

## 2014-06-01 DIAGNOSIS — Z9012 Acquired absence of left breast and nipple: Secondary | ICD-10-CM | POA: Diagnosis not present

## 2014-06-01 DIAGNOSIS — I10 Essential (primary) hypertension: Secondary | ICD-10-CM | POA: Insufficient documentation

## 2014-06-01 LAB — URINALYSIS, ROUTINE W REFLEX MICROSCOPIC
Bilirubin Urine: NEGATIVE
Glucose, UA: NEGATIVE mg/dL
Hgb urine dipstick: NEGATIVE
KETONES UR: 15 mg/dL — AB
LEUKOCYTES UA: NEGATIVE
NITRITE: NEGATIVE
PH: 6 (ref 5.0–8.0)
Protein, ur: NEGATIVE mg/dL
Specific Gravity, Urine: 1.005 (ref 1.005–1.030)
Urobilinogen, UA: 0.2 mg/dL (ref 0.0–1.0)

## 2014-06-01 LAB — BASIC METABOLIC PANEL
Anion gap: 7 (ref 5–15)
BUN: 13 mg/dL (ref 6–23)
CHLORIDE: 98 mmol/L (ref 96–112)
CO2: 30 mmol/L (ref 19–32)
Calcium: 9.2 mg/dL (ref 8.4–10.5)
Creatinine, Ser: 0.99 mg/dL (ref 0.50–1.10)
GFR calc Af Amer: 71 mL/min — ABNORMAL LOW (ref >90)
GFR calc non Af Amer: 61 mL/min — ABNORMAL LOW (ref >90)
Glucose, Bld: 147 mg/dL — ABNORMAL HIGH (ref 70–99)
POTASSIUM: 3.5 mmol/L (ref 3.5–5.1)
Sodium: 135 mmol/L (ref 135–145)

## 2014-06-01 LAB — CBC
HEMATOCRIT: 31.3 % — AB (ref 36.0–46.0)
Hemoglobin: 10.5 g/dL — ABNORMAL LOW (ref 12.0–15.0)
MCH: 31.3 pg (ref 26.0–34.0)
MCHC: 33.5 g/dL (ref 30.0–36.0)
MCV: 93.4 fL (ref 78.0–100.0)
PLATELETS: 211 10*3/uL (ref 150–400)
RBC: 3.35 MIL/uL — AB (ref 3.87–5.11)
RDW: 13.9 % (ref 11.5–15.5)
WBC: 8 10*3/uL (ref 4.0–10.5)

## 2014-06-01 MED ORDER — ONDANSETRON HCL 4 MG/2ML IJ SOLN
4.0000 mg | Freq: Once | INTRAMUSCULAR | Status: DC
  Filled 2014-06-01: qty 2

## 2014-06-01 MED ORDER — SODIUM CHLORIDE 0.9 % IV BOLUS (SEPSIS)
1000.0000 mL | Freq: Once | INTRAVENOUS | Status: AC
  Administered 2014-06-01: 1000 mL via INTRAVENOUS

## 2014-06-01 MED ORDER — IOHEXOL 350 MG/ML SOLN
80.0000 mL | Freq: Once | INTRAVENOUS | Status: AC | PRN
  Administered 2014-06-01: 80 mL via INTRAVENOUS

## 2014-06-01 MED ORDER — VANCOMYCIN HCL IN DEXTROSE 1-5 GM/200ML-% IV SOLN
1000.0000 mg | Freq: Once | INTRAVENOUS | Status: AC
  Administered 2014-06-01: 1000 mg via INTRAVENOUS
  Filled 2014-06-01: qty 200

## 2014-06-01 NOTE — ED Provider Notes (Signed)
CSN: [id number]     Arrival date & time 06/01/14  1846 History   First MD Initiated Contact with Patient 06/01/14 1920     Chief Complaint  Patient presents with  . Loss of Consciousness     (Consider location/radiation/quality/duration/timing/severity/associated sxs/prior Treatment) HPI   59 year old female with history of breast cancer status post left mastectomy on Feb 3th presents for evaluation of syncopal episode. Patient states since the mastectomy she had a JP drain placed which has produced moderate amount of bloody drainage. She was scheduled to have a JP drain removed on February 16 however with a persistent bleeding that drain is still in place. States she produced about 60-190 mL daily. Today while standing in the kitchen patient felt lightheadedness, and weak. She fell onto the ground and states she may have passed out for a brief second. She was able to call her husband who is in the room next door who guided her to the chair. She called her cancer Center in regard to her syncopal episode and was recommended to come to the ER for further evaluation. Leading up to this event patient states she has been feeling fine. She denies any precipitating symptoms prior to the fall. Denies having fever, severe headache, neck pain, chest pain, shortness of breath, productive cough, hemoptysis, abdominal pain, numbness or weakness. Denies any dysuria. Denies any other abnormal bleeding. She is currently on Herceptin and states she has decreased appetite due to it.  Past Medical History  Diagnosis Date  . Breast cancer   . Hypertension   . Alopecia areata   . Endometriosis   . Hypercholesteremia   . Diabetes mellitus without complication     controled by diet  . Status post chemotherapy 11/26/2013 - 03/11/2014    6 cycles of neoadjuvant chemotherapy with TCH-P (Taxotere, Carbolatin, Herceptin and Perjeta followed by Neulasta   Past Surgical History  Procedure Laterality Date  .  Colonoscopy    . Portacath placement N/A 11/20/2013    Procedure: INSERTION PORT-A-CATH;  Surgeon: [name], MD;  Location: [place];  Service: General;  Laterality: N/A;  . Breast surgery Left 7/15    bx  . Mastectomy w/ sentinel node biopsy Left 05/08/2014    Procedure: LEFT MASTECTOMY WITH SENTINEL LYMPH NODE BIOPSY;  Surgeon: [name], MD;  Location: [facility];  Service: General;  Laterality: Left;  . Abdominal hysterectomy  1995    BSO 1996, endometriosis   Family History  Problem Relation Age of Onset  . Diabetes Mother   . Coronary artery disease Mother   . Diabetes Father   . Diabetes Sister     gestational  . Diabetes Brother   . Diabetes Brother   . Diabetes Sister    History  Substance Use Topics  . Smoking status: Never Smoker   . Smokeless tobacco: Never Used  . Alcohol Use: No   OB History    No data available     Review of Systems  All other systems reviewed and are negative.     Allergies  Eggs or egg-derived products; Nickel; Sulfa antibiotics; Neosporin; Multihance; Penicillins; and Tetanus toxoids  Home Medications   Prior to Admission medications   Medication Sig Start Date End Date Taking? Authorizing Provider  acetaminophen (TYLENOL) 500 MG tablet Take 500 mg by mouth every 6 (six) hours as needed (pain).    Historical Provider, MD  calcium citrate-vitamin D (CITRACAL+D) 315-200 MG-UNIT per tablet Take 1 tablet by mouth daily.  Historical Provider, MD  hydrochlorothiazide (HYDRODIURIL) 25 MG tablet Take 25 mg by mouth daily.    Historical Provider, MD  HYDROcodone-acetaminophen (NORCO) 5-325 MG per tablet Take 1-2 tablets by mouth every 4 (four) hours as needed for moderate pain or severe pain. Patient not taking: Reported on 05/20/2014 05/09/14   [name], MD  hydrocortisone cream 1 % Apply 1 application topically 2 (two) times daily as needed for itching (rash).    Historical Provider, MD  lidocaine-prilocaine (EMLA) cream Apply one  application to port-a-cath 1 - 2 hours prior to access. Patient taking differently: Apply 1 application topically as needed (apply 1-2 hours prior to Herceptin infusion (every 3 weeks)).  11/15/13   Aasim Marla Roe, MD  Multiple Vitamin (MULTIVITAMIN WITH MINERALS) TABS tablet Take 1 tablet by mouth daily.    Historical Provider, MD  promethazine (PHENERGAN) 25 MG tablet Take 25 mg by mouth as needed for nausea or vomiting (prior to MRI).     Historical Provider, MD   BP 115/67 mmHg  Pulse 102  Temp(Src) 100.3 F (37.9 C) (Oral)  Resp 18  Ht 5\' 5"  (1.651 m)  Wt 164 lb (74.39 kg)  BMI 27.29 kg/m2  SpO2 98% Physical Exam  Constitutional: She is oriented to person, place, and time. She appears well-developed and well-nourished. No distress.  HENT:  Head: Atraumatic.  No signs of head injury, no scalp tenderness.  Eyes: Conjunctivae and EOM are normal. Pupils are equal, round, and reactive to light. No scleral icterus.  Neck: Normal range of motion. Neck supple.  Cardiovascular:  Mild tachycardia without murmur rubs or gallops.  Pulmonary/Chest: Effort normal and breath sounds normal.  Chaperone present: Left mastectomy with well healing surgical scar. Surrounding skin erythema with mild warmth. JP drain in place, oozing a small amount of discharge.  Abdominal: Soft. There is no tenderness.  Musculoskeletal: She exhibits no edema or tenderness.  Neurological: She is alert and oriented to person, place, and time. She has normal strength. No cranial nerve deficit. She displays a negative Romberg sign. Coordination and gait normal. GCS eye subscore is 4. GCS verbal subscore is 5. GCS motor subscore is 6.  Skin: No rash noted.  Psychiatric: She has a normal mood and affect.  Nursing note and vitals reviewed.   ED Course  Procedures (including critical care time)  7:47 PM Patient with brief syncopal episode today, it was unwitnessed. No seizure activities. No obvious signs of injury  from the fall. She has JP drain has been putting out moderate amount of serosanguineous drainage for nearly a month. She reported lightheadedness. Suspect hypovolemia or anemia causing her syncope. She has an elevated temperature and possible cellulitis at the left anterior chest however patient states that the skin changes is due to a hematoma and she denies having any fever. Workup initiated. IV fluid given.  10:18 PM Care discussed with Dr. Sabra Heck.  Plan to obtain chest CTA to r/o PE or skin abscess.  Will give vancomycin for chest wall cellulitis near surgical site.  Will consider consulting surgery once labs and CT scan resulteed.    12:07 AM I have consulted [facility] Dr. Hulen Skains who agrees to see pt in ER and will determine disposition.    12:41 AM Pt will be moved to Pod C awaiting Dr. Hulen Skains for evaluation.  Anticipate discharge with abx and outpt f/u.  Care discussed with Dr. Sharol Given who will continue to monitor.    Labs Review Labs Reviewed  CBC - Abnormal;  Notable for the following:    RBC 3.35 (*)    Hemoglobin 10.5 (*)    HCT 31.3 (*)    All other components within normal limits  BASIC METABOLIC PANEL - Abnormal; Notable for the following:    Glucose, Bld 147 (*)    GFR calc non Af Amer 61 (*)    GFR calc Af Amer 71 (*)    All other components within normal limits  URINALYSIS, ROUTINE W REFLEX MICROSCOPIC - Abnormal; Notable for the following:    Ketones, ur 15 (*)    All other components within normal limits    Imaging Review Dg Chest 2 View  06/01/2014   CLINICAL DATA:  Syncope, breast cancer status post left mastectomy on February 5th, left chest JP drain  EXAM: CHEST  2 VIEW  COMPARISON:  11/10/2013  FINDINGS: Lungs are clear.  No pleural effusion or pneumothorax.  The heart is normal in size.  Left chest power port terminating in the mid SVC.  Status post left mastectomy with left axillary lymph node dissection.  Associated left chest wall drain.  IMPRESSION: No evidence of  acute cardiopulmonary disease.  Status post left mastectomy with associated left chest wall drain.   Electronically Signed   By: Julian Hy M.D.   On: 06/01/2014 22:00   Ct Head Wo Contrast  06/01/2014   CLINICAL DATA:  Syncope  EXAM: CT HEAD WITHOUT CONTRAST  TECHNIQUE: Contiguous axial images were obtained from the base of the skull through the vertex without intravenous contrast.  COMPARISON:  None.  FINDINGS: No evidence of parenchymal hemorrhage or extra-axial fluid collection. No mass lesion, mass effect, or midline shift.  No CT evidence of acute infarction.  Cerebral volume is within normal limits.  No ventriculomegaly.  The visualized paranasal sinuses are essentially clear. The mastoid air cells are unopacified.  No evidence of calvarial fracture.  IMPRESSION: Normal head CT.   Electronically Signed   By: Julian Hy M.D.   On: 06/01/2014 21:35   Ct Angio Chest Pe W/cm &/or Wo Cm  06/01/2014   CLINICAL DATA:  Postoperative fever.  EXAM: CT ANGIOGRAPHY CHEST WITH CONTRAST  TECHNIQUE: Multidetector CT imaging of the chest was performed using the standard protocol during bolus administration of intravenous contrast. Multiplanar CT image reconstructions and MIPs were obtained to evaluate the vascular anatomy.  CONTRAST:  57mL OMNIPAQUE IOHEXOL 350 MG/ML SOLN  COMPARISON:  Chest 06/01/2014  FINDINGS: Technically adequate study with good opacification of the central and segmental pulmonary arteries. No focal filling defects demonstrated. No evidence of significant pulmonary embolus.  Normal heart size. Normal caliber thoracic aorta. No evidence of aortic dissection. Great vessel origins appear patent. Central venous catheter with tip in the superior vena cava. Esophagus is decompressed. No significant lymphadenopathy in the chest.  Mild dependent atelectasis in the lung bases. Lungs are otherwise clear and expanded. No focal airspace disease or consolidation. No pleural effusion. No  pneumothorax.  Included portions of the upper abdominal organs are grossly unremarkable. Mild degenerative changes in the spine. No destructive bone lesions. Postoperative changes in the left breast with surgical clips in the left axilla and surgical drain in place. There is a heterogeneous fluid collection in the left breast measuring 3.5 x 12.9 cm. This may represent postoperative fluid collection although abscess is not excluded. There is infiltration and thickening of the skin and subcutaneous fat over the left breast. This could represent cellulitis or postoperative change. If the patient has had radiation  to this area, radiation changes could also have this appearance.  Review of the MIP images confirms the above findings.  IMPRESSION: No evidence of significant pulmonary embolus. No evidence of active pulmonary disease. Postoperative changes in the left breast. Heterogeneous fluid collection in the left breast could represent postoperative fluid collection versus abscess. Infiltration in the subcutaneous fat and skin over the left breast could represent cellulitis, radiation change, or postoperative change. Correlation with physical examination is recommended.   Electronically Signed   By: Lucienne Capers M.D.   On: 06/01/2014 23:44     EKG Interpretation   Date/Time:  Saturday June 01 2014 19:15:35 EST Ventricular Rate:  97 PR Interval:  138 QRS Duration: 84 QT Interval:  356 QTC Calculation: 452 R Axis:   22 Text Interpretation:  Sinus rhythm with occasional Premature ventricular  complexes Otherwise normal ECG Since last tracing Rate faster Confirmed by  MILLER  MD, BRIAN ([id number]) on 06/01/2014 7:26:33 PM      MDM   Final diagnoses:  Syncope  Breast cancer  Fever postop  Cellulitis of chest wall    BP 114/57 mmHg  Pulse 86  Temp(Src) 98.6 F (37 C) (Oral)  Resp 16  Ht 5\' 5"  (1.651 m)  Wt 164 lb (74.39 kg)  BMI 27.29 kg/m2  SpO2 98%  I have reviewed nursing notes and  vital signs. I personally reviewed the imaging tests through PACS system  I reviewed available ER/hospitalization records thought the EMR     Domenic Moras, PA-C 06/02/14 1601  Johnna Acosta, MD 06/03/14 2003

## 2014-06-01 NOTE — ED Notes (Signed)
Pt reports that she had a syncopal episode this afternoon about 5pm. Denies hitting her head, or any injury. Pt reports that she has breast cancer and had a left massectomy on Feb 5th. Pt reports that has a JP drain on the left side and has been draining a significant amount of blood. Talked with surgeon office on Thursday and is watching the draining to see it slows.

## 2014-06-01 NOTE — ED Provider Notes (Signed)
The patient is a 59 year old female, he has recently had breast cancer surgery with a left mastectomy, she has not been using her left arm, she has had a Jackson-Pratt drain in the surgical site which is constantly draining serosanguineous and bloody fluid, there has been no pus, no fevers, she did develop some redness on her chest wall which she just noticed today. She did have an episode consistent with near-syncope or a short amount of syncope which was short-lived, this occurred while she was in the kitchen after sweeping the floor. She denies chest pain or coughing at this time, denies lower extremity swelling, does feel like she has a low-grade fever. On exam the patient does have erythema on the skin consistent with a cellulitis near the surgical sites. She has clear heart and lung sounds without tachycardia or rales, soft abdomen, Jackson-Pratt drain is draining bloody fluid, small amount. Her initial testing shows no acute findings, no leukocytosis, no urinary tract infection, she does have a low-grade fever, I suspect this is related to the cellulitis but with her postop status and this syncopal episodes she will need a CT scan of the chest to evaluate both for pulmonary embolism as well as cellulitis complications, abscesses or other chest infections. The patient has been informed of this plan, she is amenable to the plan, she will need Zofran prior to contrast.  Medical screening examination/treatment/procedure(s) were conducted as a shared visit with non-physician practitioner(s) and myself.  I personally evaluated the patient during the encounter.  Clinical Impression:   Final diagnoses:  Syncope  Breast cancer  Fever postop  Cellulitis of chest wall         Johnna Acosta, MD 06/03/14 2003

## 2014-06-01 NOTE — ED Notes (Signed)
MD at bedside. 

## 2014-06-02 MED ORDER — DOXYCYCLINE HYCLATE 100 MG PO CAPS: 100.0000 mg | ORAL_CAPSULE | Freq: Two times a day (BID) | ORAL | Status: DC

## 2014-06-02 NOTE — Progress Notes (Signed)
Patient came in because of another syncopal episode.  Wound was thought to be erythematous, bloody drainage from the drain.  Hemoglobin is stable, WBC is normal.  Low grade fever of 100.3.    Her mastectomy site is somewhat tense, but does not appear very erythematous to me.  It is slightly warm.  The drainage does not appear to be purulent, just old blood.  Still too much output to remove her remaining drain.  The patient does not have to be admitted.  I also do not think that she should be on antibiotic currently  No left shift and WBC is normal.  Follow up with Dr. Barry Dienes next week by phone.  Call if spike fever over 101, and limit physical activity.  Rachel [name]. [name], MD, [place] [phone] [facility] Surgery

## 2014-06-02 NOTE — Discharge Instructions (Signed)
Please follow up closely with your surgeon for further care.  Return for fever greater than 101.  Limit activity   Syncope Syncope means a person passes out (faints). The person usually wakes up in less than 5 minutes. It is important to seek medical care for syncope. HOME CARE  Have someone stay with you until you feel normal.  Do not drive, use machines, or play sports until your doctor says it is okay.  Keep all doctor visits as told.  Lie down when you feel like you might pass out. Take deep breaths. Wait until you feel normal before standing up.  Drink enough fluids to keep your pee (urine) clear or pale yellow.  If you take blood pressure or heart medicine, get up slowly. Take several minutes to sit and then stand. GET HELP RIGHT AWAY IF:   You have a severe headache.  You have pain in the chest, belly (abdomen), or back.  You are bleeding from the mouth or butt (rectum).  You have black or tarry poop (stool).  You have an irregular or very fast heartbeat.  You have pain with breathing.  You keep passing out, or you have shaking (seizures) when you pass out.  You pass out when sitting or lying down.  You feel confused.  You have trouble walking.  You have severe weakness.  You have vision problems. If you fainted, call your local emergency services (911 in [place] Do not drive yourself to the hospital. MAKE SURE YOU:   Understand these instructions.  Will watch your condition.  Will get help right away if you are not doing well or get worse. Document Released: 09/08/2007 Document Revised: 09/21/2011 Document Reviewed: 05/21/2011 [facility] Patient Information 2015 [place], [place]. This information is not intended to replace advice given to you by your health care provider. Make sure you discuss any questions you have with your health care provider.

## 2014-06-02 NOTE — ED Provider Notes (Signed)
Patient care assumed at change of shift.  Patient was awaiting evaluation by surgery.  Dr. Hulen Skains is seen.  The patient does not feel that she needs antibiotics and is stable for discharge home.  Kalman Drape, MD 06/02/14 [phone]

## 2014-06-04 ENCOUNTER — Encounter: Payer: Self-pay | Admitting: *Deleted

## 2014-06-04 NOTE — Progress Notes (Signed)
[facility] Psychosocial Distress Screening Clinical Social Work  Patient completed distress screening protocol and scored a 5 on the Psychosocial Distress Thermometer which indicates moderate distress. Patients distress was associated with physical concerns as noted on screening tool. Physician has been notified of patients concerns and appropriate interventions provided. CSW available for support as needs arise.   ONCBCN DISTRESS SCREENING 05/30/2014  Screening Type Initial Screening  Distress experienced in past week (1-10) 5  Family Problem type   Emotional problem type   Physical Problem type Bathing/dressing;Tingling hands/feet  Physician notified of physical symptoms   Referral to clinical psychology   Referral to clinical social work   Referral to dietition   Referral to financial advocate   Referral to support programs   Referral to palliative care   Other "tingling is decreasing day/week, a little stressed onsite of radiation", listed as other problem   Johnnye Lana, MSW, LCSW, OSW-C Clinical Social Worker [place] [phone]

## 2014-06-10 ENCOUNTER — Telehealth: Payer: Self-pay | Admitting: Hematology

## 2014-06-10 ENCOUNTER — Other Ambulatory Visit (HOSPITAL_BASED_OUTPATIENT_CLINIC_OR_DEPARTMENT_OTHER): Payer: Managed Care, Other (non HMO)

## 2014-06-10 ENCOUNTER — Ambulatory Visit (HOSPITAL_BASED_OUTPATIENT_CLINIC_OR_DEPARTMENT_OTHER): Payer: Managed Care, Other (non HMO) | Admitting: Hematology

## 2014-06-10 ENCOUNTER — Encounter: Payer: Self-pay | Admitting: Hematology

## 2014-06-10 ENCOUNTER — Ambulatory Visit (HOSPITAL_BASED_OUTPATIENT_CLINIC_OR_DEPARTMENT_OTHER): Payer: Managed Care, Other (non HMO)

## 2014-06-10 VITALS — BP 125/70 | HR 72 | Temp 97.9°F | Resp 18 | Ht 65.0 in | Wt 161.7 lb

## 2014-06-10 DIAGNOSIS — E119 Type 2 diabetes mellitus without complications: Secondary | ICD-10-CM

## 2014-06-10 DIAGNOSIS — D6481 Anemia due to antineoplastic chemotherapy: Secondary | ICD-10-CM

## 2014-06-10 DIAGNOSIS — Z5112 Encounter for antineoplastic immunotherapy: Secondary | ICD-10-CM

## 2014-06-10 DIAGNOSIS — I10 Essential (primary) hypertension: Secondary | ICD-10-CM

## 2014-06-10 DIAGNOSIS — C50412 Malignant neoplasm of upper-outer quadrant of left female breast: Secondary | ICD-10-CM

## 2014-06-10 DIAGNOSIS — Z170 Estrogen receptor positive status [ER+]: Secondary | ICD-10-CM

## 2014-06-10 LAB — COMPREHENSIVE METABOLIC PANEL (CC13)
ALK PHOS: 70 U/L (ref 40–150)
ALT: 11 U/L (ref 0–55)
AST: 14 U/L (ref 5–34)
Albumin: 3.1 g/dL — ABNORMAL LOW (ref 3.5–5.0)
Anion Gap: 10 mEq/L (ref 3–11)
BUN: 14.5 mg/dL (ref 7.0–26.0)
CHLORIDE: 106 meq/L (ref 98–109)
CO2: 30 mEq/L — ABNORMAL HIGH (ref 22–29)
Calcium: 10 mg/dL (ref 8.4–10.4)
Creatinine: 0.9 mg/dL (ref 0.6–1.1)
EGFR: 81 mL/min/{1.73_m2} — AB (ref >90)
GLUCOSE: 98 mg/dL (ref 70–140)
POTASSIUM: 4.1 meq/L (ref 3.5–5.1)
Sodium: 145 mEq/L (ref 136–145)
Total Bilirubin: 0.22 mg/dL (ref 0.20–1.20)
Total Protein: 7 g/dL (ref 6.4–8.3)

## 2014-06-10 LAB — CBC WITH DIFFERENTIAL/PLATELET
BASO%: 0.4 % (ref 0.0–2.0)
Basophils Absolute: 0 10*3/uL (ref 0.0–0.1)
EOS ABS: 0 10*3/uL (ref 0.0–0.5)
EOS%: 0.6 % (ref 0.0–7.0)
HCT: 32.4 % — ABNORMAL LOW (ref 34.8–46.6)
HGB: 10.4 g/dL — ABNORMAL LOW (ref 11.6–15.9)
LYMPH#: 1.4 10*3/uL (ref 0.9–3.3)
LYMPH%: 20.9 % (ref 14.0–49.7)
MCH: 30.2 pg (ref 25.1–34.0)
MCHC: 32 g/dL (ref 31.5–36.0)
MCV: 94.4 fL (ref 79.5–101.0)
MONO#: 0.5 10*3/uL (ref 0.1–0.9)
MONO%: 8 % (ref 0.0–14.0)
NEUT#: 4.5 10*3/uL (ref 1.5–6.5)
NEUT%: 70.1 % (ref 38.4–76.8)
PLATELETS: 365 10*3/uL (ref 145–400)
RBC: 3.43 10*6/uL — AB (ref 3.70–5.45)
RDW: 14.6 % — AB (ref 11.2–14.5)
WBC: 6.5 10*3/uL (ref 3.9–10.3)

## 2014-06-10 MED ORDER — SODIUM CHLORIDE 0.9 % IV SOLN
Freq: Once | INTRAVENOUS | Status: AC
  Administered 2014-06-10: 11:00:00 via INTRAVENOUS

## 2014-06-10 MED ORDER — TRASTUZUMAB CHEMO INJECTION 440 MG
6.0000 mg/kg | Freq: Once | INTRAVENOUS | Status: AC
  Administered 2014-06-10: 483 mg via INTRAVENOUS
  Filled 2014-06-10: qty 23

## 2014-06-10 MED ORDER — DIPHENHYDRAMINE HCL 25 MG PO CAPS
ORAL_CAPSULE | ORAL | Status: AC
  Filled 2014-06-10: qty 2

## 2014-06-10 MED ORDER — HEPARIN SOD (PORK) LOCK FLUSH 100 UNIT/ML IV SOLN
500.0000 [IU] | Freq: Once | INTRAVENOUS | Status: AC | PRN
  Administered 2014-06-10: 500 [IU]
  Filled 2014-06-10: qty 5

## 2014-06-10 MED ORDER — DIPHENHYDRAMINE HCL 25 MG PO CAPS
50.0000 mg | ORAL_CAPSULE | Freq: Once | ORAL | Status: AC
  Administered 2014-06-10: 50 mg via ORAL

## 2014-06-10 MED ORDER — SODIUM CHLORIDE 0.9 % IJ SOLN
10.0000 mL | INTRAMUSCULAR | Status: DC | PRN
  Administered 2014-06-10: 10 mL
  Filled 2014-06-10: qty 10

## 2014-06-10 MED ORDER — ACETAMINOPHEN 325 MG PO TABS
650.0000 mg | ORAL_TABLET | Freq: Once | ORAL | Status: AC
  Administered 2014-06-10: 650 mg via ORAL

## 2014-06-10 MED ORDER — ACETAMINOPHEN 325 MG PO TABS
ORAL_TABLET | ORAL | Status: AC
  Filled 2014-06-10: qty 2

## 2014-06-10 NOTE — Progress Notes (Signed)
[facility] OFFICE PROGRESS NOTE  Patient Care Team: [name], MD as PCP - General (Internal Medicine) [name], MD as Consulting Physician (General Surgery)  SUMMARY OF ONCOLOGIC HISTORY:   Breast cancer of upper-outer quadrant of left female breast   05/08/2013 Surgery left mastectomy and SLN biopsy, negative margins. Path showed 2 lesions 0.5cm an d1.4cm, [id number]   10/17/2013 Mammogram There is persistent distortion in the left upper outer quadrant, corresponding to the screening mammographic finding.    10/26/2013 Breast US Ultrasound is performed, showing a hypoechoic irregular mass with surrounding distortion in the left breast 2 o'clock location 4 cm from the nipple measuring 1.1 x 0.9 x 0.7 cm. This corresponds to the mammographic finding. 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 - CALCIFICATIONS IDENTIFIED   10/30/2013 Receptors her2 tumor molecular markers: ER 100%, POSITIVE, PR: 42%, POSITIVE, Ki67: 53%; HER2 (+) (ration 2.8, copy number 3.5)   11/05/2013 Breast MRI Irregular enhancing mass measuring 5.1 cm in the upper and upper-outer quadrants of the left breast. The MR enhancing mass is much larger than the mass seen mammographically or sonographically.    11/05/2013 Breast MRI Suspicious level 1 left axillary lymph node concerning for metastatic involving. But [place] negative, no node biopsy.    11/07/2013 Initial Diagnosis Breast cancer of upper-outer quadrant of left female breast   11/26/2013 - 03/11/2014 Chemotherapy 6 cycles of neoadjuvant chemotherapy with TCH-P (Taxotere, Carbolatin, Herceptin and Perjeta followed by Neulasta   04/08/2014 -  Chemotherapy Herceptin maintenance    05/08/2014 Surgery Left mastectomy, margins are negative. Surgical past showed multifocal invasive ductal carcinoma (0.5 cm, 1.4 cm), 2/2 SLN were  positive.     CURRENT THERAPY: Herceptin maintenance therapy started on 04/08/2014, every 3 weeks, plan to finish in mind Aug 2016   INTERVAL HISTORY: She returns for follow up. She has been doing well overall. She still has the drainage tube in, which is draining minimally, and we'll likely be taking out soon. She has mild discomfort at the surgical site, but no significant pain or swollen, no fever or chills. She had as episode of syncope on 06/01/2014, and went to ED. Syncope workup was negative. She reports her blood pressure has been low sometime, and she has mild lightheadedness sometime. She is on HCTZ for her hypertension.  REVIEW OF SYSTEMS:   Constitutional: Denies fevers, chills.  Eyes: Denies blurriness of vision Ears, nose, mouth, throat, and face: Denies mucositis or sore throat Respiratory: Denies cough, dyspnea or wheezes Cardiovascular: Denies palpitation, chest discomfort or lower extremity swelling Gastrointestinal:  Denies nausea, heartburn or change in bowel habits Skin: Denies abnormal skin rashes Lymphatics: Denies new lymphadenopathy or easy bruising Neurological:Denies numbness, tingling or new weaknesses Behavioral/Psych: Mood is stable, no new changes  All other systems were reviewed with the patient and are negative except those in history   MEDICAL HISTORY:  Past Medical History  Diagnosis Date  . Breast cancer   . Hypertension   . Alopecia areata   . Endometriosis   . Hypercholesteremia   . Diabetes mellitus without complication     controled by diet  . Status post chemotherapy 11/26/2013 - 03/11/2014    6 cycles of neoadjuvant chemotherapy with TCH-P (Taxotere, Carbolatin, Herceptin and Perjeta followed by Neulasta    SURGICAL HISTORY: Past Surgical History  Procedure Laterality Date  .  Colonoscopy    . Portacath placement N/A 11/20/2013    Procedure: INSERTION PORT-A-CATH;  Surgeon: [name], MD;  Location: [facility];  Service: General;   Laterality: N/A;  . Breast surgery Left 7/15    bx  . Mastectomy w/ sentinel node biopsy Left 05/08/2014    Procedure: LEFT MASTECTOMY WITH SENTINEL LYMPH NODE BIOPSY;  Surgeon: [name], MD;  Location: [facility];  Service: General;  Laterality: Left;  . Abdominal hysterectomy  [place], endometriosis    I have reviewed the social history and family history with the patient and they are unchanged from previous note.  ALLERGIES:  is allergic to eggs or egg-derived products; nickel; sulfa antibiotics; neosporin; multihance; penicillins; and tetanus toxoids.  MEDICATIONS:    Medication List       This list is accurate as of: 06/10/14 10:09 AM.  Always use your most recent med list.               acetaminophen 500 MG tablet  Commonly known as:  TYLENOL  Take 500 mg by mouth every 6 (six) hours as needed (pain).     calcium citrate-vitamin D 315-200 MG-UNIT per tablet  Commonly known as:  CITRACAL+D  Take 1 tablet by mouth daily.     HERCEPTIN IV  Inject 1 each into the vein See admin instructions. Every 3 weeks-unknown dose-last infusion 05/20/14     hydrochlorothiazide 25 MG tablet  Commonly known as:  HYDRODIURIL  Take 25 mg by mouth daily.     HYDROcodone-acetaminophen 5-325 MG per tablet  Commonly known as:  NORCO  Take 1-2 tablets by mouth every 4 (four) hours as needed for moderate pain or severe pain.     hydrocortisone cream 1 %  Apply 1 application topically 2 (two) times daily as needed for itching (rash).     lidocaine-prilocaine cream  Commonly known as:  EMLA  Apply one application to port-a-cath 1 - 2 hours prior to access.     multivitamin with minerals Tabs tablet  Take 1 tablet by mouth daily.     promethazine 25 MG tablet  Commonly known as:  PHENERGAN  Take 25 mg by mouth as needed for nausea or vomiting (prior to MRI).         PHYSICAL EXAMINATION: ECOG PERFORMANCE STATUS: 0  Filed Vitals:   06/10/14 1005  BP: 125/70  Pulse: 72   Temp: 97.9 F (36.6 C)  Resp: 18   Filed Weights   06/10/14 1005  Weight: 161 lb 11.2 oz (73.347 kg)    GENERAL:alert, no distress and comfortable SKIN: skin color, texture, turgor are normal, no rashes or significant lesions EYES: normal, Conjunctiva are pink and non-injected, sclera clear OROPHARYNX:no exudate, no erythema and lips, buccal mucosa, and tongue normal  NECK: supple, thyroid normal size, non-tender, without nodularity LYMPH:  no palpable lymphadenopathy in the cervical, axillary or inguinal LUNGS: clear to auscultation and percussion with normal breathing effort HEART: regular rate & rhythm and no murmurs and no lower extremity edema ABDOMEN:abdomen soft, non-tender and normal bowel sounds Musculoskeletal:no cyanosis of digits and no clubbing  NEURO: alert & oriented x 3 with fluent speech, no focal motor/sensory deficits Breasts: Status post left mastectomy, surgical wound healing well, draining tube in place. Palpation of the right breasts and axilla revealed no obvious mass that I could appreciate, except some fullness at the upper out quadrant of left breast.. EXTREMITIES: (+) Thickening of the nailbeds, no discharge from  the left middle finger nail bed, no surrounding skin edema or redness.   LABORATORY DATA:  I have reviewed the data as listed CBC Latest Ref Rng 06/10/2014 06/01/2014 05/20/2014  WBC 3.9 - 10.3 10e3/uL 6.5 8.0 4.2  Hemoglobin 11.6 - 15.9 g/dL 10.4(L) 10.5(L) 9.7(L)  Hematocrit 34.8 - 46.6 % 32.4(L) 31.3(L) 29.8(L)  Platelets 145 - 400 10e3/uL 365 211 231     CMP Latest Ref Rng 06/01/2014 05/20/2014 05/09/2014  Glucose 70 - 99 mg/dL 147(H) 103 162(H)  BUN 6 - 23 mg/dL 13 17.6 14  Creatinine 0.50 - 1.10 mg/dL 0.99 0.9 1.05  Sodium 135 - 145 mmol/L 135 144 139  Potassium 3.5 - 5.1 mmol/L 3.5 4.1 3.9  Chloride 96 - 112 mmol/L 98 - 107  CO2 19 - 32 mmol/L 30 31(H) 26  Calcium 8.4 - 10.5 mg/dL 9.2 9.7 8.2(L)  Total Protein 6.4 - 8.3 g/dL - 6.3(L) -   Total Bilirubin 0.20 - 1.20 mg/dL - 0.76 -  Alkaline Phos 40 - 150 U/L - 57 -  AST 5 - 34 U/L - 17 -  ALT 0 - 55 U/L - 12 -   PATHOLOGY REPORT: Diagnosis 1. Breast, simple mastectomy, left 05/08/2014 - PENDING STAINS. - MULTIFOCAL INVASIVE DUCTAL CARCINOMA WITH NEOADJUVANT RELATED CHANGES, SEE COMMENT. - NEGATIVE FOR LYMPH VASCULAR INVASION. - INVASIVE TUMOR IS 4 MM FROM NEAREST MARGIN (ANTERIOR). - DUCTAL CARCINOMA IN SITU - PREVIOUS BIOPSY SITE. - BENIGN SKIN; NEGATIVE FOR TUMOR - SEE TUMOR SYNOPTIC TEMPLATE BELOW. 2. Lymph node, sentinel, biopsy, left axillary #1 - ONE LYMPH NODE, POSITIVE FOR METASTATIC MAMMARY CARCINOMA (1/1). - INTRANODAL TUMOR DEPOSIT IS 2 MM - NEGATIVE FOR EXTRACAPSULAR TUMOR EXTENSION. 3. Lymph node, sentinel, biopsy, left axillary #2 - ONE LYMPH NODE, POSITIVE FOR METASTATIC MAMMARY CARCINOMA (1/1). - INTRANODAL TUMOR DEPOSIT IS 4 MM - NEGATIVE FOR EXTRACAPSULAR TUMOR EXTENSION.  Microscopic Comment 1. BREAST, INVASIVE TUMOR, WITH LYMPH NODES PRESENT Specimen, including laterality and lymph node sampling (sentinel, non-sentinel): Left breast Procedure: Simple mastectomy Histologic type: Ductal (x3 tumors), see comment. Grade: II of III (Tumor #1 and 2), see comment Tubule formation: 2 Nuclear pleomorphism: 3 Mitotic: 2 Grade: I of III (Tumor #3), see comment Tubule formation: 1 Nuclear pleomorphism: 1 Mitotic: 1 1 of 4 FINAL for ADAMS-FOUST, Truc E ([id number]) Microscopic Comment(continued) Tumor size (glass slide measurement): 0.5 cm and 1.4 cm; gross measurement 2.0 cm, see comment. Margins: Invasive, distance to closest margin: 4 mm In-situ, distance to closest margin: 4 mm (anterior) If margin positive, focally or broadly: N/A Lymphovascular invasion: Absent Ductal carcinoma in situ: Present Grade: III of III Extensive intraductal component: Absent Lobular neoplasia: Pending stains Tumor focality: Multifocal Treatment effect:  Present If present, treatment effect in breast tissue, lymph nodes or both: Tissue Extent of tumor: Skin: Negative Nipple: Negative Skeletal muscle: Negative Lymph nodes: Examined: 2 Sentinel 0 Non-sentinel 2 Total Lymph nodes with metastasis: 2 Isolated tumor cells (< 0.2 mm): 0 Micrometastasis: (> 0.2 mm and < 2.0 mm): 0 Macrometastasis: (> 2.0 mm): X 2 Extracapsular extension: Absent Breast prognostic profile: Estrogen receptor: Not repeated, previous study demonstrated 100% positivity and 100% positivity ([id number] and [id number]) Progesterone receptor: Not repeated, previous study demonstrated 67%% and 42% positivity ([id number] and [id number]) Her 2 neu: Not repeated, previous study demonstrated amplification ([id number] and [id number]) Ki-67: Not repeated, previous study demonstrated 12% and 53% proliferation rate ([id number] and [id number]) Non-neoplastic breast: Previous biopsy site; fibrocystic change, usual ductal hyperplasia, calcifications and neoadjuvant related tissue  changes. TNM: ympT1c, pN1a, pMX Comments: There are three different mass areas identified. The first central mass associated with barbell shaped clip demonstrates both in situ and invasive ductal carcinoma. Within the focus, the invasive ductal carcinoma spans 5 mm. The second posterior medial mass demonstrates both in situ and invasive ductal carcinoma spanning the entire 2.0 cm grossly identified stellate area. The third and final lateral mass with associated heart shaped clip demonstrates both in situ and invasive ductal carcinoma. The invasive tumor spans 1.4cm and is involved by and away from previous biopsy site. The lack of the myoepithelial layer was confirmed with smooth muscle myosin heavy chain, Calponin, and p63 immunostains (slide 1K).. The presence of strong diffuse E-Cadherin expression and absence of cytokeratin 5/6 expression supports a ductal phenotype to the in situ  carcinoma (slide 1K). (CR:kh 05-09-14) Mali RUND DO Pathologist, Electronic Signature (Case signed 05/13/2014) Specimen Gross and Clinical Information  RADIOGRAPHIC STUDIES: I have personally reviewed the radiological images as listed and agreed with the findings in the report.  ECHO 05/30/2014  - Left ventricle: Global longitudinal LV strain was calculated at -13.5 but this appears inaccurate due to inaccurate tracking. The cavity size was normal. Systolic function was normal. The estimated ejection fraction was in the range of 55% to 60%. Wall motion was normal; there were no regional wall motion abnormalities. There was an increased relative contribution of atrial contraction to ventricular filling. Doppler parameters are consistent with abnormal left ventricular relaxation (grade 1 diastolic dysfunction). - Aortic valve: A bicuspid morphology cannot be excluded. Moderate diffuse thickening and calcification. - Tricuspid valve: There was trivial regurgitation.  BREAST MRI 04/03/2014  IMPRESSION: 1. There has been significant improvement in the non mass enhancement in the upper-outer quadrant of the left breast. Persistent faint enhancement is identified now measuring up to 4.4 cm. One of the biopsy sites is within the area of enhancement. The second biopsy site is not associated with persistent enhancement on today's exam. 2. No suspicious lymph nodes identified on today's exam.   ASSESSMENT & PLAN:  This is a very pleasant 59 years-old Afro-american female from [facility] who presents with a left Invasive Ductal Carcinoma. This was picked on a mammogram and maximal diameter was 1.7 cm on Ultrasound however the MRI breast showed this mass to be large almost 5.1 cm in maximal dimension making it a clinical T3 tumor. Also it showed a suspicious looking solitary lymph node in axilla but [place] was negative, not biopsied.  Clinical T3N0M0 Stage IIB.  The tumor biology  indicates a LUMINAL-B type which means ER/PR/HER2 + tumor. Tumor is strongly ER+100%, PR 42%,KI-67 Index 53% showing a brisk mitotic rate and grade is called grade 2 (but grading as per pathologist cannot be relied upon on initial biopsy).   1. 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  -her restaging breast MRI results showed good partial response -I discussed her surgical pathology findings with her in details. Unfortunately she had metastasis to 2 Sentinel lymph nodes. Axillary lymph node dissection was discussed, Dr. Barry Dienes, Dr. Valere Dross and me and we recommend to proceed ALND, which is the standard care for now. She  However declined surgery due to the concern of lymphedema. She is agreeable to have adjuvant radiation including the axillary area. All NORMAL FEMALE N -I'll continue her Herceptin every 3 weeks to complete a total of  one year treatment, through 11/2014 -She will start hormonal therapy after she completes adjuvant radiation -We'll monitor her heart function by repeating echo every 3 months. Her recent echo showed normal EF.  2. Anemia, secondary to chemotherapy -improved  3. Grade 1 peripheral neuropathy, and a mild intermittent ankle swollen, nail changes.  Likely secondary to chemotherapy (docetaxel) -Continue observation  4. HTN, DM -Follow up with primary care physician -I suspect her primary syncope episode could be related to hypotension. She is not eating and drinking as well as usual.  -I told her to stop HCTZ, and watch her blood pressure carefully at home. She knows to resume HCTZ if her blood pressure becomes high.   Plan -cont herceptin q3w -RTC in 3 weeks -She will follow-up with Dr. Barry Dienes and Dr. Valere Dross  All questions were answered. The patient knows to call the clinic with any problems, questions or  concerns. No barriers to learning was detected.  I spent 25 minutes counseling the patient face to face. The total time spent in the appointment was 30 minutes and more than 50% was on counseling and review of test results     Truitt Merle, MD 06/10/2014   10:09 AM

## 2014-06-10 NOTE — Telephone Encounter (Signed)
gv and printed appt sched and avs for pt for March and April...sed added tx. °

## 2014-06-10 NOTE — Patient Instructions (Signed)
[facility] Discharge Instructions for Patients Receiving Chemotherapy  Today you received the following chemotherapy agents Herceptin  To help prevent nausea and vomiting after your treatment, we encourage you to take your nausea medication     If you develop nausea and vomiting that is not controlled by your nausea medication, call the clinic.   BELOW ARE SYMPTOMS THAT SHOULD BE REPORTED IMMEDIATELY:  *FEVER GREATER THAN 100.5 F  *CHILLS WITH OR WITHOUT FEVER  NAUSEA AND VOMITING THAT IS NOT CONTROLLED WITH YOUR NAUSEA MEDICATION  *UNUSUAL SHORTNESS OF BREATH  *UNUSUAL BRUISING OR BLEEDING  TENDERNESS IN MOUTH AND THROAT WITH OR WITHOUT PRESENCE OF ULCERS  *URINARY PROBLEMS  *BOWEL PROBLEMS  UNUSUAL RASH Items with * indicate a potential emergency and should be followed up as soon as possible.  Feel free to call the clinic you have any questions or concerns. The clinic phone number is [phone].    

## 2014-06-11 ENCOUNTER — Other Ambulatory Visit: Payer: Self-pay | Admitting: Radiation Oncology

## 2014-06-19 ENCOUNTER — Inpatient Hospital Stay
Admission: RE | Admit: 2014-06-19 | Payer: Managed Care, Other (non HMO) | Source: Ambulatory Visit | Admitting: Radiation Oncology

## 2014-06-19 ENCOUNTER — Ambulatory Visit: Payer: Managed Care, Other (non HMO) | Admitting: Radiation Oncology

## 2014-06-26 ENCOUNTER — Ambulatory Visit
Admission: RE | Admit: 2014-06-26 | Discharge: 2014-06-26 | Disposition: A | Discharge status: Home / Self Care | Payer: Managed Care, Other (non HMO) | Source: Ambulatory Visit | Attending: Radiation Oncology | Admitting: Radiation Oncology

## 2014-06-26 DIAGNOSIS — C50412 Malignant neoplasm of upper-outer quadrant of left female breast: Secondary | ICD-10-CM

## 2014-06-26 DIAGNOSIS — Z510 Encounter for antineoplastic radiation therapy: Secondary | ICD-10-CM | POA: Diagnosis not present

## 2014-06-26 NOTE — Progress Notes (Addendum)
Complex simulation/treatment planning note: The patient was taken to the CT simulator.  A Vac lock immobilization device was constructed on a custom breast board.  Her left chest wall was marked with radiopaque wires along with her left  mastectomy scar and drain site along the lower left chest wall.  She was initially scanned free breathing.  It was noted that the cardiac silhouette would be within the tangential field and she was rescanned with deep inspiration breath-hold.  There was satisfactory movement of the cardiac silhouette away from the tangential field.  After choosing an isocenter, the data set was sent to the planning system for contouring of her normal anatomy.  She was set up to medial and lateral left chest wall tangents with 2 separate and unique MLCs designed.  She was then set up to the left supraclavicular region, RAO with a separate unique MLC and lastly to her left axilla, PA with another MLC.  I prescribing 5040 cGy in 28 sessions to her left chest wall and regional lymph nodes.  This be followed by electron beam boost 1000 cGy in 5 sessions to her left chest wall mastectomy scar.

## 2014-06-28 ENCOUNTER — Other Ambulatory Visit: Payer: Self-pay | Admitting: Hematology

## 2014-07-01 ENCOUNTER — Other Ambulatory Visit: Payer: Managed Care, Other (non HMO)

## 2014-07-01 ENCOUNTER — Other Ambulatory Visit (HOSPITAL_BASED_OUTPATIENT_CLINIC_OR_DEPARTMENT_OTHER): Payer: Managed Care, Other (non HMO)

## 2014-07-01 ENCOUNTER — Ambulatory Visit (HOSPITAL_BASED_OUTPATIENT_CLINIC_OR_DEPARTMENT_OTHER): Payer: Managed Care, Other (non HMO)

## 2014-07-01 ENCOUNTER — Ambulatory Visit (HOSPITAL_BASED_OUTPATIENT_CLINIC_OR_DEPARTMENT_OTHER): Payer: Managed Care, Other (non HMO) | Admitting: Physician Assistant

## 2014-07-01 ENCOUNTER — Ambulatory Visit: Payer: Managed Care, Other (non HMO) | Admitting: Nurse Practitioner

## 2014-07-01 ENCOUNTER — Ambulatory Visit: Payer: Managed Care, Other (non HMO)

## 2014-07-01 ENCOUNTER — Ambulatory Visit: Payer: Managed Care, Other (non HMO) | Admitting: Nutrition

## 2014-07-01 ENCOUNTER — Encounter: Payer: Self-pay | Admitting: Physician Assistant

## 2014-07-01 VITALS — BP 124/65 | HR 87 | Temp 98.8°F | Resp 18 | Wt 156.4 lb

## 2014-07-01 DIAGNOSIS — Z5112 Encounter for antineoplastic immunotherapy: Secondary | ICD-10-CM | POA: Diagnosis not present

## 2014-07-01 DIAGNOSIS — C50012 Malignant neoplasm of nipple and areola, left female breast: Secondary | ICD-10-CM | POA: Diagnosis not present

## 2014-07-01 DIAGNOSIS — C50412 Malignant neoplasm of upper-outer quadrant of left female breast: Secondary | ICD-10-CM

## 2014-07-01 DIAGNOSIS — T7840XA Allergy, unspecified, initial encounter: Secondary | ICD-10-CM

## 2014-07-01 LAB — CBC WITH DIFFERENTIAL/PLATELET
BASO%: 0.2 % (ref 0.0–2.0)
Basophils Absolute: 0 10*3/uL (ref 0.0–0.1)
EOS%: 0.7 % (ref 0.0–7.0)
Eosinophils Absolute: 0 10*3/uL (ref 0.0–0.5)
HEMATOCRIT: 32.4 % — AB (ref 34.8–46.6)
HGB: 10.4 g/dL — ABNORMAL LOW (ref 11.6–15.9)
LYMPH#: 1.6 10*3/uL (ref 0.9–3.3)
LYMPH%: 30.3 % (ref 14.0–49.7)
MCH: 29.1 pg (ref 25.1–34.0)
MCHC: 32.1 g/dL (ref 31.5–36.0)
MCV: 90.8 fL (ref 79.5–101.0)
MONO#: 0.5 10*3/uL (ref 0.1–0.9)
MONO%: 9.9 % (ref 0.0–14.0)
NEUT#: 3.2 10*3/uL (ref 1.5–6.5)
NEUT%: 58.9 % (ref 38.4–76.8)
Platelets: 201 10*3/uL (ref 145–400)
RBC: 3.57 10*6/uL — ABNORMAL LOW (ref 3.70–5.45)
RDW: 14.5 % (ref 11.2–14.5)
WBC: 5.4 10*3/uL (ref 3.9–10.3)

## 2014-07-01 LAB — COMPREHENSIVE METABOLIC PANEL (CC13)
ALK PHOS: 74 U/L (ref 40–150)
ALT: 15 U/L (ref 0–55)
AST: 15 U/L (ref 5–34)
Albumin: 3.1 g/dL — ABNORMAL LOW (ref 3.5–5.0)
Anion Gap: 9 mEq/L (ref 3–11)
BILIRUBIN TOTAL: 0.5 mg/dL (ref 0.20–1.20)
BUN: 12.8 mg/dL (ref 7.0–26.0)
CO2: 27 mEq/L (ref 22–29)
CREATININE: 0.9 mg/dL (ref 0.6–1.1)
Calcium: 9.3 mg/dL (ref 8.4–10.4)
Chloride: 107 mEq/L (ref 98–109)
EGFR: 83 mL/min/{1.73_m2} — ABNORMAL LOW (ref >90)
Glucose: 111 mg/dl (ref 70–140)
Potassium: 4.3 mEq/L (ref 3.5–5.1)
SODIUM: 144 meq/L (ref 136–145)
Total Protein: 7.4 g/dL (ref 6.4–8.3)

## 2014-07-01 MED ORDER — HEPARIN SOD (PORK) LOCK FLUSH 100 UNIT/ML IV SOLN
500.0000 [IU] | Freq: Once | INTRAVENOUS | Status: AC | PRN
  Administered 2014-07-01: 500 [IU]
  Filled 2014-07-01: qty 5

## 2014-07-01 MED ORDER — MEPERIDINE HCL 25 MG/ML IJ SOLN
INTRAMUSCULAR | Status: AC
  Filled 2014-07-01: qty 1

## 2014-07-01 MED ORDER — METHYLPREDNISOLONE SODIUM SUCC 125 MG IJ SOLR
125.0000 mg | Freq: Once | INTRAMUSCULAR | Status: AC | PRN
Start: 2014-07-01 — End: 2014-07-01
  Administered 2014-07-01: 125 mg via INTRAVENOUS

## 2014-07-01 MED ORDER — DIPHENHYDRAMINE HCL 25 MG PO CAPS
50.0000 mg | ORAL_CAPSULE | Freq: Once | ORAL | Status: AC
  Administered 2014-07-01: 50 mg via ORAL

## 2014-07-01 MED ORDER — DIPHENHYDRAMINE HCL 25 MG PO CAPS
ORAL_CAPSULE | ORAL | Status: AC
  Filled 2014-07-01: qty 2

## 2014-07-01 MED ORDER — MEPERIDINE HCL 25 MG/ML IJ SOLN
12.5000 mg | Freq: Once | INTRAMUSCULAR | Status: AC
  Administered 2014-07-01: 12.5 mg via INTRAVENOUS

## 2014-07-01 MED ORDER — SODIUM CHLORIDE 0.9 % IV SOLN
Freq: Once | INTRAVENOUS | Status: AC
Start: 2014-07-01 — End: 2014-07-01
  Administered 2014-07-01: 10:00:00 via INTRAVENOUS

## 2014-07-01 MED ORDER — ACETAMINOPHEN 325 MG PO TABS
650.0000 mg | ORAL_TABLET | Freq: Once | ORAL | Status: AC
  Administered 2014-07-01: 650 mg via ORAL

## 2014-07-01 MED ORDER — SODIUM CHLORIDE 0.9 % IJ SOLN
10.0000 mL | INTRAMUSCULAR | Status: DC | PRN
  Administered 2014-07-01: 10 mL
  Filled 2014-07-01: qty 10

## 2014-07-01 MED ORDER — TRASTUZUMAB CHEMO INJECTION 440 MG
6.0000 mg/kg | Freq: Once | INTRAVENOUS | Status: AC
  Administered 2014-07-01: 420 mg via INTRAVENOUS
  Filled 2014-07-01: qty 20

## 2014-07-01 MED ORDER — ACETAMINOPHEN 325 MG PO TABS
ORAL_TABLET | ORAL | Status: AC
Start: 2014-07-01 — End: 2014-07-01
  Filled 2014-07-01: qty 2

## 2014-07-01 NOTE — Progress Notes (Signed)
Patient identified to be at risk for malnutrition on the MST secondary to poor appetite and weight loss.  59 year old female diagnosed with breast cancer.  She is a patient of Dr. Burr Medico.  Past medical history includes hypertension, endometriosis, hypercholesterolemia, diabetes.  Medications include Citrucel, multivitamin, and Phenergan.  Labs include albumin 3.1 on March 17.  Height: 65 inches. Weight: 156.4 pounds March 28 Usual body weight: 165 pounds per patient.  Patient weighed 180 pounds in January 2016. BMI: 26.03.  Patient reports poor appetite throughout treatment.  She typically consumes one meal daily. Patient denies nausea and states bowels are moving appropriately. Dietary recall reveals patient tries to consume healthy plant-based diet. States higher weight result of steroids during treatment.  Nutrition diagnosis:  Unintended weight loss related to breast cancer and associated treatments as evidenced by 13% weight loss January 2016.  Intervention:  I encouraged patient to continue healthy plant-based diet with increased vegetables and lean protein sources. Reviewed high protein foods with patient. Encouraged patient to increase vegetables and whole gr. Fact sheets were provided.  Stressed importance of weight maintenance.  Monitoring, evaluation, goals: Patient will tolerate a healthy plant-based diet to promote weight maintenance.  Next visit: Patient will contact me with questions or concerns.  **Disclaimer: This note was dictated with voice recognition software. Similar sounding words can inadvertently be transcribed and this note may contain transcription errors which may not have been corrected upon publication of note.**

## 2014-07-01 NOTE — Progress Notes (Signed)
Pt completed Herceptin infusion today at  1145. 1146 -  Pt developed shaking chills, remained AxO x 3, no respiratory distress noted.  Retta Mac, NP came to see pt in infusion room.  NS infused per gravity wide open.  Solumedrol 125mg  IVP given at 1149 by Eustaquio Maize, RN.  Demerol  12.5mg  IVP given by Eustaquio Maize, RN at [phone].  Pt was monitored very closely with normal saline running at 100cc/hr. Jenny Reichmann, NP came to reassess pt at 1215.  Pt was stable without complaints. 1300 -  Pt was stable, alert and oriented x 3.  Pt was stable to be discharged home via self ambulation to meet ride in the lobby.

## 2014-07-01 NOTE — Progress Notes (Signed)
[place] OFFICE PROGRESS NOTE  Patient Care Team: Seward Carol, MD as PCP - General (Internal Medicine) [name], MD as Consulting Physician (General Surgery)  SUMMARY OF ONCOLOGIC HISTORY:   Breast cancer of upper-outer quadrant of left female breast   05/08/2013 Surgery left mastectomy and SLN biopsy, negative margins. Path showed 2 lesions 0.5cm an d1.4cm, [id number]   10/17/2013 Mammogram There is persistent distortion in the left upper outer quadrant, corresponding to the screening mammographic finding.    10/26/2013 Breast US Ultrasound is performed, showing a hypoechoic irregular mass with surrounding distortion in the left breast 2 o'clock location 4 cm from the nipple measuring 1.1 x 0.9 x 0.7 cm. This corresponds to the mammographic finding. 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 - CALCIFICATIONS IDENTIFIED   10/30/2013 Receptors her2 tumor molecular markers: ER 100%, POSITIVE, PR: 42%, POSITIVE, Ki67: 53%; HER2 (+) (ration 2.8, copy number 3.5)   11/05/2013 Breast MRI Irregular enhancing mass measuring 5.1 cm in the upper and upper-outer quadrants of the left breast. The MR enhancing mass is much larger than the mass seen mammographically or sonographically.    11/05/2013 Breast MRI Suspicious level 1 left axillary lymph node concerning for metastatic involving. But [place] negative, no node biopsy.    11/07/2013 Initial Diagnosis Breast cancer of upper-outer quadrant of left female breast   11/26/2013 - 03/11/2014 Chemotherapy 6 cycles of neoadjuvant chemotherapy with TCH-P (Taxotere, Carbolatin, Herceptin and Perjeta followed by Neulasta   04/08/2014 -  Chemotherapy Herceptin maintenance    05/08/2014 Surgery Left mastectomy, margins are negative. Surgical past showed multifocal invasive ductal carcinoma (0.5 cm, 1.4 cm), 2/2 SLN were  positive.     CURRENT THERAPY: Herceptin maintenance therapy started on 04/08/2014, every 3 weeks, plan to finish in mind Aug 2016   INTERVAL HISTORY: She returns for follow up. She continues to do well overall.She has mild discomfort at the surgical site for her drainage tube, but no significant pain or swollen, no fever or chills. She voiced no other specific complaints today.  REVIEW OF SYSTEMS:   Constitutional: Denies fevers, chills.  Eyes: Denies blurriness of vision Ears, nose, mouth, throat, and face: Denies mucositis or sore throat Respiratory: Denies cough, dyspnea or wheezes Cardiovascular: Denies palpitation, chest discomfort or lower extremity swelling Gastrointestinal:  Denies nausea, heartburn or change in bowel habits Skin: Denies abnormal skin rashes Lymphatics: Denies new lymphadenopathy or easy bruising Neurological:Denies numbness, tingling or new weaknesses Behavioral/Psych: Mood is stable, no new changes  All other systems were reviewed with the patient and are negative except those in history   MEDICAL HISTORY:  Past Medical History  Diagnosis Date  . Breast cancer   . Hypertension   . Alopecia areata   . Endometriosis   . Hypercholesteremia   . Diabetes mellitus without complication     controled by diet  . Status post chemotherapy 11/26/2013 - 03/11/2014    6 cycles of neoadjuvant chemotherapy with TCH-P (Taxotere, Carbolatin, Herceptin and Perjeta followed by Neulasta    SURGICAL HISTORY: Past Surgical History  Procedure Laterality Date  . Colonoscopy    . Portacath placement N/A 11/20/2013    Procedure: INSERTION PORT-A-CATH;  Surgeon: [name], MD;  Location: [place];  Service: General;  Laterality: N/A;  . Breast surgery Left 7/15    bx  . Mastectomy w/ sentinel node biopsy  Left 05/08/2014    Procedure: LEFT MASTECTOMY WITH SENTINEL LYMPH NODE BIOPSY;  Surgeon: [name], MD;  Location: [place];  Service: General;  Laterality: Left;  .  Abdominal hysterectomy  [place], endometriosis    I have reviewed the social history and family history with the patient and they are unchanged from previous note.  ALLERGIES:  is allergic to eggs or egg-derived products; nickel; sulfa antibiotics; neosporin; multihance; penicillins; and tetanus toxoids.  MEDICATIONS:    Medication List       This list is accurate as of: 07/01/14  9:44 AM.  Always use your most recent med list.               acetaminophen 500 MG tablet  Commonly known as:  TYLENOL  Take 500 mg by mouth every 6 (six) hours as needed (pain).     calcium citrate-vitamin D 315-200 MG-UNIT per tablet  Commonly known as:  CITRACAL+D  Take 1 tablet by mouth daily.     HERCEPTIN IV  Inject 1 each into the vein See admin instructions. Every 3 weeks-unknown dose-last infusion 05/20/14     hydrochlorothiazide 25 MG tablet  Commonly known as:  HYDRODIURIL  Take 25 mg by mouth daily.     HYDROcodone-acetaminophen 5-325 MG per tablet  Commonly known as:  NORCO  Take 1-2 tablets by mouth every 4 (four) hours as needed for moderate pain or severe pain.     hydrocortisone cream 1 %  Apply 1 application topically 2 (two) times daily as needed for itching (rash).     lidocaine-prilocaine cream  Commonly known as:  EMLA  Apply one application to port-a-cath 1 - 2 hours prior to access.     multivitamin with minerals Tabs tablet  Take 1 tablet by mouth daily.     promethazine 25 MG tablet  Commonly known as:  PHENERGAN  Take 25 mg by mouth as needed for nausea or vomiting (prior to MRI).         PHYSICAL EXAMINATION: ECOG PERFORMANCE STATUS: 0  Filed Vitals:   07/01/14 0856  BP: 124/65  Pulse: 87  Temp: 98.8 F (37.1 C)  Resp: 18   Filed Weights   07/01/14 0856  Weight: 156 lb 6.4 oz (70.943 kg)    GENERAL:alert, no distress and comfortable SKIN: skin color, texture, turgor are normal, no rashes or significant lesions EYES: normal,  Conjunctiva are pink and non-injected, sclera clear OROPHARYNX:no exudate, no erythema and lips, buccal mucosa, and tongue normal  NECK: supple, thyroid normal size, non-tender, without nodularity LYMPH:  no palpable lymphadenopathy in the cervical, axillary or inguinal LUNGS: clear to auscultation and percussion with normal breathing effort HEART: regular rate & rhythm and no murmurs and no lower extremity edema ABDOMEN:abdomen soft, non-tender and normal bowel sounds Musculoskeletal:no cyanosis of digits and no clubbing  NEURO: alert & oriented x 3 with fluent speech, no focal motor/sensory deficits Breasts: exam deferred EXTREMITIES: (+) Thickening of the nailbeds, no discharge from the left middle finger nail bed, no surrounding skin edema or redness.   LABORATORY DATA:  I have reviewed the data as listed CBC Latest Ref Rng 07/01/2014 06/10/2014 06/01/2014  WBC 3.9 - 10.3 10e3/uL 5.4 6.5 8.0  Hemoglobin 11.6 - 15.9 g/dL 10.4(L) 10.4(L) 10.5(L)  Hematocrit 34.8 - 46.6 % 32.4(L) 32.4(L) 31.3(L)  Platelets 145 - 400 10e3/uL 201 365 211     CMP Latest Ref Rng 07/01/2014 06/10/2014 06/01/2014  Glucose 70 -  140 mg/dl 111 98 147(H)  BUN 7.0 - 26.0 mg/dL 12.8 14.5 13  Creatinine 0.6 - 1.1 mg/dL 0.9 0.9 0.99  Sodium 136 - 145 mEq/L 144 145 135  Potassium 3.5 - 5.1 mEq/L 4.3 4.1 3.5  Chloride 96 - 112 mmol/L - - 98  CO2 22 - 29 mEq/L 27 30(H) 30  Calcium 8.4 - 10.4 mg/dL 9.3 10.0 9.2  Total Protein 6.4 - 8.3 g/dL 7.4 7.0 -  Total Bilirubin 0.20 - 1.20 mg/dL 0.50 0.22 -  Alkaline Phos 40 - 150 U/L 74 70 -  AST 5 - 34 U/L 15 14 -  ALT 0 - 55 U/L 15 11 -   PATHOLOGY REPORT: Diagnosis 1. Breast, simple mastectomy, left 05/08/2014 - PENDING STAINS. - MULTIFOCAL INVASIVE DUCTAL CARCINOMA WITH NEOADJUVANT RELATED CHANGES, SEE COMMENT. - NEGATIVE FOR LYMPH VASCULAR INVASION. - INVASIVE TUMOR IS 4 MM FROM NEAREST MARGIN (ANTERIOR). - DUCTAL CARCINOMA IN SITU - PREVIOUS BIOPSY SITE. - BENIGN  SKIN; NEGATIVE FOR TUMOR - SEE TUMOR SYNOPTIC TEMPLATE BELOW. 2. Lymph node, sentinel, biopsy, left axillary #1 - ONE LYMPH NODE, POSITIVE FOR METASTATIC MAMMARY CARCINOMA (1/1). - INTRANODAL TUMOR DEPOSIT IS 2 MM - NEGATIVE FOR EXTRACAPSULAR TUMOR EXTENSION. 3. Lymph node, sentinel, biopsy, left axillary #2 - ONE LYMPH NODE, POSITIVE FOR METASTATIC MAMMARY CARCINOMA (1/1). - INTRANODAL TUMOR DEPOSIT IS 4 MM - NEGATIVE FOR EXTRACAPSULAR TUMOR EXTENSION.  Microscopic Comment 1. BREAST, INVASIVE TUMOR, WITH LYMPH NODES PRESENT Specimen, including laterality and lymph node sampling (sentinel, non-sentinel): Left breast Procedure: Simple mastectomy Histologic type: Ductal (x3 tumors), see comment. Grade: II of III (Tumor #1 and 2), see comment Tubule formation: 2 Nuclear pleomorphism: 3 Mitotic: 2 Grade: I of III (Tumor #3), see comment Tubule formation: 1 Nuclear pleomorphism: 1 Mitotic: 1 1 of 4 FINAL for ADAMS-FOUST, [place] E ([id number]) Microscopic Comment(continued) Tumor size (glass slide measurement): 0.5 cm and 1.4 cm; gross measurement 2.0 cm, see comment. Margins: Invasive, distance to closest margin: 4 mm In-situ, distance to closest margin: 4 mm (anterior) If margin positive, focally or broadly: N/A Lymphovascular invasion: Absent Ductal carcinoma in situ: Present Grade: III of III Extensive intraductal component: Absent Lobular neoplasia: Pending stains Tumor focality: Multifocal Treatment effect: Present If present, treatment effect in breast tissue, lymph nodes or both: Tissue Extent of tumor: Skin: Negative Nipple: Negative Skeletal muscle: Negative Lymph nodes: Examined: 2 Sentinel 0 Non-sentinel 2 Total Lymph nodes with metastasis: 2 Isolated tumor cells (< 0.2 mm): 0 Micrometastasis: (> 0.2 mm and < 2.0 mm): 0 Macrometastasis: (> 2.0 mm): X 2 Extracapsular extension: Absent Breast prognostic profile: Estrogen receptor: Not repeated, previous study  demonstrated 100% positivity and 100% positivity ([id number] and [id number]) Progesterone receptor: Not repeated, previous study demonstrated 67%% and 42% positivity ([id number] and [id number]) Her 2 neu: Not repeated, previous study demonstrated amplification ([id number] and [id number]) Ki-67: Not repeated, previous study demonstrated 12% and 53% proliferation rate ([id number] and [id number]) Non-neoplastic breast: Previous biopsy site; fibrocystic change, usual ductal hyperplasia, calcifications and neoadjuvant related tissue changes. TNM: ympT1c, pN1a, pMX Comments: There are three different mass areas identified. The first central mass associated with barbell shaped clip demonstrates both in situ and invasive ductal carcinoma. Within the focus, the invasive ductal carcinoma spans 5 mm. The second posterior medial mass demonstrates both in situ and invasive ductal carcinoma spanning the entire 2.0 cm grossly identified stellate area. The third and final lateral mass with associated heart shaped clip demonstrates both in situ and invasive ductal carcinoma. The  invasive tumor spans 1.4cm and is involved by and away from previous biopsy site. The lack of the myoepithelial layer was confirmed with smooth muscle myosin heavy chain, Calponin, and p63 immunostains (slide 1K).. The presence of strong diffuse E-Cadherin expression and absence of cytokeratin 5/6 expression supports a ductal phenotype to the in situ carcinoma (slide 1K). (CR:kh 05-09-14) [place] RUND DO Pathologist, Electronic Signature (Case signed 05/13/2014) Specimen Gross and Clinical Information  RADIOGRAPHIC STUDIES: I have personally reviewed the radiological images as listed and agreed with the findings in the report.  ECHO 05/30/2014  - Left ventricle: Global longitudinal LV strain was calculated at -13.5 but this appears inaccurate due to inaccurate tracking. The cavity size was normal. Systolic function  was normal. The estimated ejection fraction was in the range of 55% to 60%. Wall motion was normal; there were no regional wall motion abnormalities. There was an increased relative contribution of atrial contraction to ventricular filling. Doppler parameters are consistent with abnormal left ventricular relaxation (grade 1 diastolic dysfunction). - Aortic valve: A bicuspid morphology cannot be excluded. Moderate diffuse thickening and calcification. - Tricuspid valve: There was trivial regurgitation.  BREAST MRI 04/03/2014  IMPRESSION: 1. There has been significant improvement in the non mass enhancement in the upper-outer quadrant of the left breast. Persistent faint enhancement is identified now measuring up to 4.4 cm. One of the biopsy sites is within the area of enhancement. The second biopsy site is not associated with persistent enhancement on today's exam. 2. No suspicious lymph nodes identified on today's exam.   ASSESSMENT & PLAN:  This is a very pleasant 59 years-old Afro-american female from [facility] who presents with a left Invasive Ductal Carcinoma. This was picked on a mammogram and maximal diameter was 1.7 cm on Ultrasound however the MRI breast showed this mass to be large almost 5.1 cm in maximal dimension making it a clinical T3 tumor. Also it showed a suspicious looking solitary lymph node in axilla but [place] was negative, not biopsied.  Clinical T3N0M0 Stage IIB.  The tumor biology indicates a LUMINAL-B type which means ER/PR/HER2 + tumor. Tumor is strongly ER+100%, PR 42%,KI-67 Index 53% showing a brisk mitotic rate and grade is called grade 2 (but grading as per pathologist cannot be relied upon on initial biopsy).   1. 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  -her restaging breast MRI results  showed good partial response -I discussed her surgical pathology findings with her in details. Unfortunately she had metastasis to 2 Sentinel lymph nodes. Axillary lymph node dissection was discussed, Dr. Barry Dienes, Dr. Valere Dross and me and we recommend to proceed ALND, which is the standard care for now. She  However declined surgery due to the concern of lymphedema. She is agreeable to have adjuvant radiation including the axillary area. All NORMAL FEMALE N -I'll continue her Herceptin every 3 weeks to complete a total of one year treatment, through 11/2014 -She will start hormonal therapy after she completes adjuvant radiation -We'll monitor her heart function by repeating echo every 3 months. Her recent echo showed normal EF.  2. Anemia, secondary to chemotherapy -improved  3. Grade 1 peripheral neuropathy, and a mild intermittent ankle swelling, nail changes.  Likely secondary to chemotherapy (docetaxel) -Continue observation  4. HTN, DM -Follow up with primary care physician -She is currently off her HCTZ and continues to monitor her  blood pressureI and will resume the HCTZ if her blood pressure becomes high.  Plan -She will continue on herceptin q3w -RTC in 3 weeks -She will follow-up with Dr. Barry Dienes and Dr. Valere Dross  All questions were answered. The patient knows to call the clinic with any problems, questions or concerns. No barriers to learning was detected.  I spent 25 minutes counseling the patient face to face. The total time spent in the appointment was 30 minutes and more than 50% was on counseling and review of test results     Carlton Adam, PA-C 07/01/2014   9:44 AM

## 2014-07-01 NOTE — Patient Instructions (Signed)
[facility] Discharge Instructions for Patients Receiving Chemotherapy  Today you received the following chemotherapy agents Herceptin.  To help prevent nausea and vomiting after your treatment, we encourage you to take your nausea medication as prescribed by your physician. If you develop nausea and vomiting that is not controlled by your nausea medication, call the clinic.   BELOW ARE SYMPTOMS THAT SHOULD BE REPORTED IMMEDIATELY:  *FEVER GREATER THAN 100.5 F  *CHILLS WITH OR WITHOUT FEVER  NAUSEA AND VOMITING THAT IS NOT CONTROLLED WITH YOUR NAUSEA MEDICATION  *UNUSUAL SHORTNESS OF BREATH  *UNUSUAL BRUISING OR BLEEDING  TENDERNESS IN MOUTH AND THROAT WITH OR WITHOUT PRESENCE OF ULCERS  *URINARY PROBLEMS  *BOWEL PROBLEMS  UNUSUAL RASH Items with * indicate a potential emergency and should be followed up as soon as possible.  Feel free to call the clinic you have any questions or concerns. The clinic phone number is [phone].  Please show the CHEMO ALERT CARD at check-in to the Emergency Department and triage nurse.   

## 2014-07-04 ENCOUNTER — Ambulatory Visit: Payer: Managed Care, Other (non HMO) | Admitting: Radiation Oncology

## 2014-07-04 NOTE — Patient Instructions (Signed)
Continue labs and chemotherapy as scheduled Follow up in 3 weeks 

## 2014-07-06 ENCOUNTER — Encounter: Payer: Self-pay | Admitting: Nurse Practitioner

## 2014-07-06 DIAGNOSIS — T7840XA Allergy, unspecified, initial encounter: Secondary | ICD-10-CM | POA: Insufficient documentation

## 2014-07-06 NOTE — Progress Notes (Signed)
SYMPTOM MANAGEMENT CLINIC   HPI: Rachel [name] 59 y.o. female diagnosed with breast cancer.  She is s/p mastectomy, radiation tx, and chemo last received in December 2015.  Currently undergoing herceptin therapy.   Pt had completed her herceptin infusion; and then developed rigors and some brief hypertension with BP 132/103.  She has no other hypersensitivity symptoms whatsoever. She had been premedicated with both tylenol and benadryl 25 mg.  She was given Solumedrol 125 mg and demerol 12.5 mg for reaction; and all symptoms eventually resolved completely.  All vitals returned to baseline. Pt was able to be discharged home with family.   HPI  ROS  Past Medical History  Diagnosis Date  . Breast cancer   . Hypertension   . Alopecia areata   . Endometriosis   . Hypercholesteremia   . Diabetes mellitus without complication     controled by diet  . Status post chemotherapy 11/26/2013 - 03/11/2014    6 cycles of neoadjuvant chemotherapy with TCH-P (Taxotere, Carbolatin, Herceptin and Perjeta followed by Neulasta    Past Surgical History  Procedure Laterality Date  . Colonoscopy    . Portacath placement N/A 11/20/2013    Procedure: INSERTION PORT-A-CATH;  Surgeon: [name], MD;  Location: [facility];  Service: General;  Laterality: N/A;  . Breast surgery Left 7/15    bx  . Mastectomy w/ sentinel node biopsy Left 05/08/2014    Procedure: LEFT MASTECTOMY WITH SENTINEL LYMPH NODE BIOPSY;  Surgeon: [name], MD;  Location: [place];  Service: General;  Laterality: Left;  . Abdominal hysterectomy  [place], endometriosis    has Breast cancer of upper-outer quadrant of left female breast and Hypersensitivity reaction on her problem list.    is allergic to eggs or egg-derived products; nickel; sulfa antibiotics; neosporin; multihance; penicillins; and tetanus toxoids.    Medication List       This list is accurate as of: 07/01/14 11:59 PM.  Always use your most recent  med list.               acetaminophen 500 MG tablet  Commonly known as:  TYLENOL  Take 500 mg by mouth every 6 (six) hours as needed (pain).     calcium citrate-vitamin D 315-200 MG-UNIT per tablet  Commonly known as:  CITRACAL+D  Take 1 tablet by mouth daily.     HERCEPTIN IV  Inject 1 each into the vein See admin instructions. Every 3 weeks-unknown dose-last infusion 05/20/14     hydrochlorothiazide 25 MG tablet  Commonly known as:  HYDRODIURIL  Take 25 mg by mouth daily.     HYDROcodone-acetaminophen 5-325 MG per tablet  Commonly known as:  NORCO  Take 1-2 tablets by mouth every 4 (four) hours as needed for moderate pain or severe pain.     hydrocortisone cream 1 %  Apply 1 application topically 2 (two) times daily as needed for itching (rash).     lidocaine-prilocaine cream  Commonly known as:  EMLA  Apply one application to port-a-cath 1 - 2 hours prior to access.     multivitamin with minerals Tabs tablet  Take 1 tablet by mouth daily.     promethazine 25 MG tablet  Commonly known as:  PHENERGAN  Take 25 mg by mouth as needed for nausea or vomiting (prior to MRI).         PHYSICAL EXAMINATION  Oncology Vitals 07/01/2014 07/01/2014 07/01/2014 07/01/2014 07/01/2014 07/01/2014 07/01/2014  Height - - - - - - -  Weight - - - - - 71.442 kg 71.578 kg  Weight (lbs) - - - - - 157 lbs 8 oz 157 lbs 13 oz  BMI (kg/m2) - - - - - - -  Temp 98 - - - 98.6 - -  Pulse 113 94 103 108 107 - -  Resp '18 20 20 20 20 ' - -  SpO2 - 100 100 100 100 - -  BSA (m2) - - - - - - -   BP Readings from Last 3 Encounters:  07/01/14 131/64  07/01/14 124/65  06/10/14 125/70    Physical Exam  Constitutional: She is oriented to person, place, and time and well-developed, well-nourished, and in no distress.  HENT:  Head: Normocephalic and atraumatic.  Mouth/Throat: Oropharynx is clear and moist.  Eyes: Conjunctivae and EOM are normal. Pupils are equal, round, and reactive to light. Right eye  exhibits no discharge. Left eye exhibits no discharge. No scleral icterus.  Neck: Normal range of motion. Neck supple. No JVD present. No tracheal deviation present. No thyromegaly present.  Cardiovascular: Normal rate, regular rhythm, normal heart sounds and intact distal pulses.   Pulmonary/Chest: Effort normal and breath sounds normal. No stridor. No respiratory distress. She has no wheezes. She has no rales. She exhibits no tenderness.  Abdominal: Soft. Bowel sounds are normal. She exhibits no distension and no mass. There is no tenderness. There is no rebound and no guarding.  Musculoskeletal: Normal range of motion. She exhibits no edema or tenderness.  Lymphadenopathy:    She has no cervical adenopathy.  Neurological: She is alert and oriented to person, place, and time.  Skin: Skin is warm and dry. No rash noted. No erythema. No pallor.  Psychiatric: Affect normal.  Nursing note and vitals reviewed.   LABORATORY DATA:. Appointment on 07/01/2014  Component Date Value Ref Range Status  . WBC 07/01/2014 5.4  3.9 - 10.3 10e3/uL Final  . NEUT# 07/01/2014 3.2  1.5 - 6.5 10e3/uL Final  . HGB 07/01/2014 10.4* 11.6 - 15.9 g/dL Final  . HCT 07/01/2014 32.4* 34.8 - 46.6 % Final  . Platelets 07/01/2014 201  145 - 400 10e3/uL Final  . MCV 07/01/2014 90.8  79.5 - 101.0 fL Final  . MCH 07/01/2014 29.1  25.1 - 34.0 pg Final  . MCHC 07/01/2014 32.1  31.5 - 36.0 g/dL Final  . RBC 07/01/2014 3.57* 3.70 - 5.45 10e6/uL Final  . RDW 07/01/2014 14.5  11.2 - 14.5 % Final  . lymph# 07/01/2014 1.6  0.9 - 3.3 10e3/uL Final  . MONO# 07/01/2014 0.5  0.1 - 0.9 10e3/uL Final  . Eosinophils Absolute 07/01/2014 0.0  0.0 - 0.5 10e3/uL Final  . Basophils Absolute 07/01/2014 0.0  0.0 - 0.1 10e3/uL Final  . NEUT% 07/01/2014 58.9  38.4 - 76.8 % Final  . LYMPH% 07/01/2014 30.3  14.0 - 49.7 % Final  . MONO% 07/01/2014 9.9  0.0 - 14.0 % Final  . EOS% 07/01/2014 0.7  0.0 - 7.0 % Final  . BASO% 07/01/2014 0.2  0.0 -  2.0 % Final  . Sodium 07/01/2014 144  136 - 145 mEq/L Final  . Potassium 07/01/2014 4.3  3.5 - 5.1 mEq/L Final  . Chloride 07/01/2014 107  98 - 109 mEq/L Final  . CO2 07/01/2014 27  22 - 29 mEq/L Final  . Glucose 07/01/2014 111  70 - 140 mg/dl Final  . BUN 07/01/2014 12.8  7.0 - 26.0 mg/dL Final  . Creatinine 07/01/2014 0.9  0.6 -  1.1 mg/dL Final  . Total Bilirubin 07/01/2014 0.50  0.20 - 1.20 mg/dL Final  . Alkaline Phosphatase 07/01/2014 74  40 - 150 U/L Final  . AST 07/01/2014 15  5 - 34 U/L Final  . ALT 07/01/2014 15  0 - 55 U/L Final  . Total Protein 07/01/2014 7.4  6.4 - 8.3 g/dL Final  . Albumin 07/01/2014 3.1* 3.5 - 5.0 g/dL Final  . Calcium 07/01/2014 9.3  8.4 - 10.4 mg/dL Final  . Anion Gap 07/01/2014 9  3 - 11 mEq/L Final  . EGFR 07/01/2014 83* >90 ml/min/1.73 m2 Final   eGFR is calculated using the CKD-EPI Creatinine Equation (2009)     RADIOGRAPHIC STUDIES: No results found.  ASSESSMENT/PLAN:    Hypersensitivity reaction Pt had completed her herceptin infusion; and then developed rigors and some brief hypertension with BP 132/103.  She has no other hypersensitivity symptoms whatsoever. She had been premedicated with both tylenol and benadryl 25 mg.  She was given Solumedrol 125 mg and demerol 12.5 mg for reaction; and all symptoms eventually resolved completely.  All vitals returned to baseline. Pt was able to be discharged home with family.    She was given instruction sheet detailing use of both benadryl and pepcid for any mild reaction symptoms; and advised to go to the ED for any severe allergic type symptoms.    Breast cancer of upper-outer quadrant of left female breast Pt completed her last chemo in December 2015.  She continues with herceptin infusions on an every 3 week basis.   She is scheduled to return for her next herceptin infusion on 07/22/14.  Will review today's reaction symptoms with Dr. Burr Medico to determine any future plan of care regarding herceptin.     Patient stated understanding of all instructions; and was in agreement with this plan of care. The patient knows to call the clinic with any problems, questions or concerns.   Review/collaboration with Dr. Marko Plume regarding all aspects of patient's visit today.   Total time spent with patient was 40 minutes;  with greater than 75 percent of that time spent in face to face counseling regarding patient's symptoms,  and coordination of care and follow up.  Disclaimer: This note was dictated with voice recognition software. Similar sounding words can inadvertently be transcribed and may not be corrected upon review.   Drue Second, NP 07/06/2014

## 2014-07-06 NOTE — Assessment & Plan Note (Signed)
Pt had completed her herceptin infusion; and then developed rigors and some brief hypertension with BP 132/103.  She has no other hypersensitivity symptoms whatsoever. She had been premedicated with both tylenol and benadryl 25 mg.  She was given Solumedrol 125 mg and demerol 12.5 mg for reaction; and all symptoms eventually resolved completely.  All vitals returned to baseline. Pt was able to be discharged home with family.    She was given instruction sheet detailing use of both benadryl and pepcid for any mild reaction symptoms; and advised to go to the ED for any severe allergic type symptoms.

## 2014-07-06 NOTE — Assessment & Plan Note (Signed)
Pt completed her last chemo in December 2015.  She continues with herceptin infusions on an every 3 week basis.   She is scheduled to return for her next herceptin infusion on 07/22/14.  Will review today's reaction symptoms with Dr. Burr Medico to determine any future plan of care regarding herceptin.

## 2014-07-07 ENCOUNTER — Other Ambulatory Visit: Payer: Self-pay | Admitting: Hematology

## 2014-07-08 ENCOUNTER — Encounter: Payer: Self-pay | Admitting: Radiation Oncology

## 2014-07-08 ENCOUNTER — Ambulatory Visit: Payer: Managed Care, Other (non HMO)

## 2014-07-08 DIAGNOSIS — Z510 Encounter for antineoplastic radiation therapy: Secondary | ICD-10-CM | POA: Diagnosis not present

## 2014-07-08 NOTE — Addendum Note (Signed)
Encounter addended by: Arloa Koh, MD on: 07/08/2014  3:48 PM<BR>     Documentation filed: Notes Section

## 2014-07-08 NOTE — Progress Notes (Signed)
3-D simulation note: The patient completed 3-D simulation for her treatment to the left breast and regional lymph nodes.  She was set up to medial and lateral left breast tangents with deep inspiration breath-hold.  Dose volume histograms were obtained for the target structures including her left chest wall along with avoidance structures including her lungs and heart.  We met our departmental guidelines.  Unique MLCs were utilized for each tangent field along with unique electronic compensation fields for a total four complex treatment for her left chest wall.  She is also set up to the left supraclavicular region, RAO, and left axilla, PA with unique MLCs for a total of 7 complex treatment devices including her Vac lock immobilization device.  I prescribing 5040 cGy in 28 sessions to the left breast and left supraclavicular region with the PA left axillary field bringing the mid axillary dose up to 4600 cGy in 28 sessions.  She is treated with mixed 10 MV and 6 MV photons to her left supraglottic oh/axillary region and 6 MV photons for her left chest wall tangents.  She will have construction of 1.0 cm custom bolus to apply to her left chest wall every other day.  This will be constructed and apply to her skin on the first day of her therapy.  She will then undergo a left mastectomy scar boost for a further 1000 cGy in 5 sessions with 6 MEV electrons upon completion of her left chest wall photon irradiation.

## 2014-07-09 ENCOUNTER — Ambulatory Visit
Admission: RE | Admit: 2014-07-09 | Discharge: 2014-07-09 | Disposition: A | Discharge status: Home / Self Care | Payer: Managed Care, Other (non HMO) | Source: Ambulatory Visit | Attending: Radiation Oncology | Admitting: Radiation Oncology

## 2014-07-09 ENCOUNTER — Ambulatory Visit: Payer: Managed Care, Other (non HMO)

## 2014-07-09 DIAGNOSIS — Z510 Encounter for antineoplastic radiation therapy: Secondary | ICD-10-CM | POA: Diagnosis not present

## 2014-07-10 ENCOUNTER — Ambulatory Visit
Admission: RE | Admit: 2014-07-10 | Discharge: 2014-07-10 | Disposition: A | Discharge status: Home / Self Care | Payer: Managed Care, Other (non HMO) | Source: Ambulatory Visit | Attending: Radiation Oncology | Admitting: Radiation Oncology

## 2014-07-10 ENCOUNTER — Ambulatory Visit: Payer: Managed Care, Other (non HMO)

## 2014-07-10 DIAGNOSIS — Z510 Encounter for antineoplastic radiation therapy: Secondary | ICD-10-CM | POA: Diagnosis not present

## 2014-07-10 DIAGNOSIS — C50412 Malignant neoplasm of upper-outer quadrant of left female breast: Secondary | ICD-10-CM

## 2014-07-10 MED ORDER — ALRA NON-METALLIC DEODORANT (RAD-ONC)
1.0000 "application " | Freq: Once | TOPICAL | Status: AC
  Administered 2014-07-10: 1 via TOPICAL

## 2014-07-10 MED ORDER — BIAFINE EX EMUL
CUTANEOUS | Status: DC | PRN
  Administered 2014-07-10: 14:00:00 via TOPICAL

## 2014-07-10 NOTE — Progress Notes (Signed)
    Pt here for patient teaching.  Pt given Radiation and You booklet, skin care instructions, Alra deodorant and Radiaplex gel. Reviewed areas of pertinence such as fatigue, hair loss, skin changes, breast tenderness and breast swelling . Pt able to give teach back of to pat skin, use unscented/gentle soap, use baby wipes, drink plenty of water and sitz bath,apply Radiaplex bid, avoid applying anything to skin within 4 hours of treatment, avoid wearing an under wire bra and to use an electric razor if they must shave. Pt demonstrated understanding, needs reinforcement and verbalizes understanding of information given and will contact nursing with any questions or concerns.

## 2014-07-11 ENCOUNTER — Ambulatory Visit
Admission: RE | Admit: 2014-07-11 | Discharge: 2014-07-11 | Disposition: A | Discharge status: Home / Self Care | Payer: Managed Care, Other (non HMO) | Source: Ambulatory Visit | Attending: Radiation Oncology | Admitting: Radiation Oncology

## 2014-07-11 DIAGNOSIS — Z510 Encounter for antineoplastic radiation therapy: Secondary | ICD-10-CM | POA: Diagnosis not present

## 2014-07-12 ENCOUNTER — Ambulatory Visit
Admission: RE | Admit: 2014-07-12 | Discharge: 2014-07-12 | Disposition: A | Discharge status: Home / Self Care | Payer: Managed Care, Other (non HMO) | Source: Ambulatory Visit | Attending: Radiation Oncology | Admitting: Radiation Oncology

## 2014-07-12 DIAGNOSIS — Z510 Encounter for antineoplastic radiation therapy: Secondary | ICD-10-CM | POA: Diagnosis not present

## 2014-07-15 ENCOUNTER — Ambulatory Visit
Admission: RE | Admit: 2014-07-15 | Discharge: 2014-07-15 | Disposition: A | Discharge status: Home / Self Care | Payer: Managed Care, Other (non HMO) | Source: Ambulatory Visit | Attending: Radiation Oncology | Admitting: Radiation Oncology

## 2014-07-15 ENCOUNTER — Other Ambulatory Visit: Payer: Self-pay | Admitting: Nurse Practitioner

## 2014-07-15 ENCOUNTER — Encounter: Payer: Self-pay | Admitting: Nurse Practitioner

## 2014-07-15 ENCOUNTER — Encounter: Payer: Self-pay | Admitting: Radiation Oncology

## 2014-07-15 ENCOUNTER — Ambulatory Visit (HOSPITAL_BASED_OUTPATIENT_CLINIC_OR_DEPARTMENT_OTHER): Payer: Managed Care, Other (non HMO) | Admitting: Nurse Practitioner

## 2014-07-15 VITALS — BP 131/74 | HR 97 | Temp 98.4°F | Resp 16 | Ht 65.0 in | Wt 157.0 lb

## 2014-07-15 DIAGNOSIS — L03313 Cellulitis of chest wall: Secondary | ICD-10-CM | POA: Diagnosis not present

## 2014-07-15 DIAGNOSIS — L0390 Cellulitis, unspecified: Secondary | ICD-10-CM | POA: Insufficient documentation

## 2014-07-15 DIAGNOSIS — Z510 Encounter for antineoplastic radiation therapy: Secondary | ICD-10-CM | POA: Diagnosis not present

## 2014-07-15 DIAGNOSIS — L03319 Cellulitis of trunk, unspecified: Secondary | ICD-10-CM

## 2014-07-15 DIAGNOSIS — C50412 Malignant neoplasm of upper-outer quadrant of left female breast: Secondary | ICD-10-CM | POA: Diagnosis not present

## 2014-07-15 MED ORDER — LEVOFLOXACIN 500 MG PO TABS
500.0000 mg | ORAL_TABLET | Freq: Every day | ORAL | Status: DC
Start: 2014-07-15 — End: 2014-08-13

## 2014-07-15 NOTE — Progress Notes (Signed)
SYMPTOM MANAGEMENT CLINIC   HPI: Rachel [name] 59 y.o. female diagnosed with breast cancer.  Patient is status post mastectomy and chemotherapy.  Patient is currently undergoing Herceptin therapy and radiation therapy.  Patient received day #4 of her radiation therapy today to her left chest wall for breast cancer treatment.  Patient was noted to have some erythema and warmth to her left chest wall directly over her Port-A-Cath site.  Patient denies any tenderness to site.  She also denies any recent fevers or chills.  HPI  ROS  Past Medical History  Diagnosis Date  . Breast cancer   . Hypertension   . Alopecia areata   . Endometriosis   . Hypercholesteremia   . Diabetes mellitus without complication     controled by diet  . Status post chemotherapy 11/26/2013 - 03/11/2014    6 cycles of neoadjuvant chemotherapy with TCH-P (Taxotere, Carbolatin, Herceptin and Perjeta followed by Neulasta    Past Surgical History  Procedure Laterality Date  . Colonoscopy    . Portacath placement N/A 11/20/2013    Procedure: INSERTION PORT-A-CATH;  Surgeon: [name], MD;  Location: [place];  Service: General;  Laterality: N/A;  . Breast surgery Left 7/15    bx  . Mastectomy w/ sentinel node biopsy Left 05/08/2014    Procedure: LEFT MASTECTOMY WITH SENTINEL LYMPH NODE BIOPSY;  Surgeon: [name], MD;  Location: [place];  Service: General;  Laterality: Left;  . Abdominal hysterectomy  [place], endometriosis    has Breast cancer of upper-outer quadrant of left female breast; Hypersensitivity reaction; and Cellulitis on her problem list.    is allergic to eggs or egg-derived products; nickel; sulfa antibiotics; neosporin; multihance; penicillins; and tetanus toxoids.    Medication List       This list is accurate as of: 07/15/14  4:04 PM.  Always use your most recent med list.               acetaminophen 500 MG tablet  Commonly known as:  TYLENOL  Take 500 mg by  mouth every 6 (six) hours as needed (pain).     calcium citrate-vitamin D 315-200 MG-UNIT per tablet  Commonly known as:  CITRACAL+D  Take 1 tablet by mouth daily.     HERCEPTIN IV  Inject 1 each into the vein See admin instructions. Every 3 weeks-unknown dose-last infusion 05/20/14     hydrochlorothiazide 25 MG tablet  Commonly known as:  HYDRODIURIL  Take 25 mg by mouth daily.     HYDROcodone-acetaminophen 5-325 MG per tablet  Commonly known as:  NORCO  Take 1-2 tablets by mouth every 4 (four) hours as needed for moderate pain or severe pain.     hydrocortisone cream 1 %  Apply 1 application topically 2 (two) times daily as needed for itching (rash).     levofloxacin 500 MG tablet  Commonly known as:  LEVAQUIN  Take 1 tablet (500 mg total) by mouth daily.     lidocaine-prilocaine cream  Commonly known as:  EMLA  Apply one application to port-a-cath 1 - 2 hours prior to access.     multivitamin with minerals Tabs tablet  Take 1 tablet by mouth daily.     promethazine 25 MG tablet  Commonly known as:  PHENERGAN  Take 25 mg by mouth as needed for nausea or vomiting (prior to MRI).         PHYSICAL EXAMINATION  Oncology Vitals 07/15/2014 07/01/2014 07/01/2014  07/01/2014 07/01/2014 07/01/2014 07/01/2014  Height 165 cm - - - - - -  Weight 71.215 kg - - - - - 71.442 kg  Weight (lbs) 157 lbs - - - - - 157 lbs 8 oz  BMI (kg/m2) 26.13 kg/m2 - - - - - -  Temp 98.4 98 - - - 98.6 -  Pulse 97 113 94 103 108 107 -  Resp _0 -  SpO2 - - 100 100 100 100 -  BSA (m2) 1.81 m2 - - - - - -   BP Readings from Last 3 Encounters:  07/01/14 131/64  07/01/14 124/65  06/10/14 125/70   Vitals done in Rad Onc: 131/74, HR 97, temp 98.4  Physical Exam  Constitutional: She is oriented to person, place, and time and well-developed, well-nourished, and in no distress.  HENT:  Head: Normocephalic and atraumatic.  Mouth/Throat: Oropharynx is clear and moist.  Eyes: Conjunctivae and  EOM are normal. Pupils are equal, round, and reactive to light.  Neck: Normal range of motion.  Pulmonary/Chest: Effort normal. No respiratory distress.  Musculoskeletal: Normal range of motion. She exhibits edema. She exhibits no tenderness.  CT note and scanned section regarding cellulitis findings.  Neurological: She is alert and oriented to person, place, and time. Gait normal.  Skin: Skin is warm and dry. No rash noted. There is erythema. No pallor.  Left upper chest wall Port-A-Cath site with some erythema, warmth, and mild edema.  There are is no tenderness, red streaks, or distended veins on exam.  The rest of patient's left upper chest wall radiation field with no erythema or radiation dermatitis.  Left mastectomy surgical site well-healed.  Psychiatric: Affect normal.  Nursing note and vitals reviewed.   LABORATORY DATA:. No visits with results within 3 Day(s) from this visit. Latest known visit with results is:  Appointment on 07/01/2014  Component Date Value Ref Range Status  . WBC 07/01/2014 5.4  3.9 - 10.3 10e3/uL Final  . NEUT# 07/01/2014 3.2  1.5 - 6.5 10e3/uL Final  . HGB 07/01/2014 10.4* 11.6 - 15.9 g/dL Final  . HCT 07/01/2014 32.4* 34.8 - 46.6 % Final  . Platelets 07/01/2014 201  145 - 400 10e3/uL Final  . MCV 07/01/2014 90.8  79.5 - 101.0 fL Final  . MCH 07/01/2014 29.1  25.1 - 34.0 pg Final  . MCHC 07/01/2014 32.1  31.5 - 36.0 g/dL Final  . RBC 07/01/2014 3.57* 3.70 - 5.45 10e6/uL Final  . RDW 07/01/2014 14.5  11.2 - 14.5 % Final  . lymph# 07/01/2014 1.6  0.9 - 3.3 10e3/uL Final  . MONO# 07/01/2014 0.5  0.1 - 0.9 10e3/uL Final  . Eosinophils Absolute 07/01/2014 0.0  0.0 - 0.5 10e3/uL Final  . Basophils Absolute 07/01/2014 0.0  0.0 - 0.1 10e3/uL Final  . NEUT% 07/01/2014 58.9  38.4 - 76.8 % Final  . LYMPH% 07/01/2014 30.3  14.0 - 49.7 % Final  . MONO% 07/01/2014 9.9  0.0 - 14.0 % Final  . EOS% 07/01/2014 0.7  0.0 - 7.0 % Final  . BASO% 07/01/2014 0.2  0.0  - 2.0 % Final  . Sodium 07/01/2014 144  136 - 145 mEq/L Final  . Potassium 07/01/2014 4.3  3.5 - 5.1 mEq/L Final  . Chloride 07/01/2014 107  98 - 109 mEq/L Final  . CO2 07/01/2014 27  22 - 29 mEq/L Final  . Glucose 07/01/2014 111  70 - 140 mg/dl Final  . BUN 07/01/2014 12.8  7.0 - 26.0 mg/dL Final  . Creatinine 07/01/2014 0.9  0.6 - 1.1 mg/dL Final  . Total Bilirubin 07/01/2014 0.50  0.20 - 1.20 mg/dL Final  . Alkaline Phosphatase 07/01/2014 74  40 - 150 U/L Final  . AST 07/01/2014 15  5 - 34 U/L Final  . ALT 07/01/2014 15  0 - 55 U/L Final  . Total Protein 07/01/2014 7.4  6.4 - 8.3 g/dL Final  . Albumin 07/01/2014 3.1* 3.5 - 5.0 g/dL Final  . Calcium 07/01/2014 9.3  8.4 - 10.4 mg/dL Final  . Anion Gap 07/01/2014 9  3 - 11 mEq/L Final  . EGFR 07/01/2014 83* >90 ml/min/1.73 m2 Final   eGFR is calculated using the CKD-EPI Creatinine Equation (2009)     RADIOGRAPHIC STUDIES: No results found.  ASSESSMENT/PLAN:    Cellulitis Patient received day #4 of her radiation therapy today to her left chest wall for breast cancer treatment.  Patient was noted to have some erythema and warmth to her left chest wall directly over her Port-A-Cath site.  Patient denies any tenderness to site.  She also denies any recent fevers or chills.  On exam-there is some erythema, warmth, and mild edema directly over her left upper chest Port-A-Cath site.  There are no distended veins.  Otherwise, left chest wall radiation field with no erythema or dermatitis at present.  Will prescribe Levaquin antibiotics for probable mild cellulitis to left chest wall.  Advised patient to withhold from having any blood draws from her Port-A-Cath till cellulitis has completely cleared.  Patient was also encouraged to call/return if she develops any fevers, chills, or other new symptoms whatsoever.   Breast cancer of upper-outer quadrant of left female breast Patient received her last chemotherapy on 03/21/2014.  She  continues to receive Herceptin on an every three-week basis.  She last received Herceptin on 07/01/2014.  She is scheduled to receive her next Herceptin infusion on 07/22/2014.   Patient stated understanding of all instructions; and was in agreement with this plan of care. The patient knows to call the clinic with any problems, questions or concerns.   This was a shared visit with Dr. Burr Medico.  Total time spent with patient was 25 minutes;  with greater than 75 percent of that time spent in face to face counseling regarding patient's symptoms,  and coordination of care and follow up.  Disclaimer: This note was dictated with voice recognition software. Similar sounding words can inadvertently be transcribed and may not be corrected upon review.   Drue Second, NP 07/15/2014   I have seen the patient, examined her. I agree with the assessment and and plan and have edited the notes.  Her port area is concerning for early cellulitis, will treat her with levaquin for 7 days I will see her back next week before herceptin therapy  Truitt Merle  07/15/2014

## 2014-07-15 NOTE — Progress Notes (Signed)
Weekly Management Note:  Site: Left chest wall and regional lymph nodes Current Dose:  720  cGy Projected Dose: 5040  cGy  Narrative: The patient is seen today for routine under treatment assessment. CBCT/MVCT images/port films were reviewed. The chart was reviewed.   The patient isn't her first week of radiation therapy to her left chest wall and regional lymph nodes.  She gives a history of discomfort along her left flank.  She took acetaminophen and her pain is improved.  She uses Biafine cream twice a day.  She denies discomfort along her left chest wall Port-A-Cath.  It was last used 2 weeks ago, according to the patient, for Herceptin.  Physical Examination:  Filed Vitals:   07/15/14 1248  BP: 131/74  Pulse: 97  Temp: 98.4 F (36.9 C)  Resp: 16  .  Weight: 157 lb (71.215 kg).  There are no significant skin changes along left chest wall/clavicular region/axilla except for erythema along her left upper anterior chest Port-A-Cath.  The erythema is not from her radiation therapy since she has had only 4 treatments.  I wonder if she has a low-grade cellulitis.  There is no palpable back/flank discomfort.  Impression: Tolerating radiation therapy well, except for erythema along her left Port-A-Cath.  The appearance is not from radiation dermatitis, and I wonder if she has an early cellulitis.  I will like for medical oncology to have a look at her Port-A-Cath site today before she develops erythema from her radiation therapy.  Plan: Continue radiation therapy as planned.  Visit to medical oncology this afternoon to have a look at her Port-A-Cath site.

## 2014-07-15 NOTE — Progress Notes (Signed)
Rachel [name] has completed 4 fractions to her left chest wall/subclavian area.  She reports pain in her left lower abdomen/back that started last Thursday.  She rates her pain at a 5/10.  She has been tylenol once a day starting on Saturday.  She reports fatigue.  The skin on her left chest wall has hyperpigmentation.  Redness noted over her port a cath area.  She reports that the redness is not new.  She is using biafine cream twice a day.  BP 131/74 mmHg  Pulse 97  Temp(Src) 98.4 F (36.9 C) (Oral)  Resp 16  Ht 5\' 5"  (1.651 m)  Wt 157 lb (71.215 kg)  BMI 26.13 kg/m2

## 2014-07-15 NOTE — Assessment & Plan Note (Signed)
Patient received her last chemotherapy on 03/21/2014.  She continues to receive Herceptin on an every three-week basis.  She last received Herceptin on 07/01/2014.  She is scheduled to receive her next Herceptin infusion on 07/22/2014.

## 2014-07-15 NOTE — Assessment & Plan Note (Signed)
Patient received day #4 of her radiation therapy today to her left chest wall for breast cancer treatment.  Patient was noted to have some erythema and warmth to her left chest wall directly over her Port-A-Cath site.  Patient denies any tenderness to site.  She also denies any recent fevers or chills.  On exam-there is some erythema, warmth, and mild edema directly over her left upper chest Port-A-Cath site.  There are no distended veins.  Otherwise, left chest wall radiation field with no erythema or dermatitis at present.  Will prescribe Levaquin antibiotics for probable mild cellulitis to left chest wall.  Advised patient to withhold from having any blood draws from her Port-A-Cath till cellulitis has completely cleared.  Patient was also encouraged to call/return if she develops any fevers, chills, or other new symptoms whatsoever.

## 2014-07-16 ENCOUNTER — Telehealth: Payer: Self-pay | Admitting: *Deleted

## 2014-07-16 ENCOUNTER — Ambulatory Visit
Admission: RE | Admit: 2014-07-16 | Discharge: 2014-07-16 | Disposition: A | Discharge status: Home / Self Care | Payer: Managed Care, Other (non HMO) | Source: Ambulatory Visit | Attending: Radiation Oncology | Admitting: Radiation Oncology

## 2014-07-16 DIAGNOSIS — Z510 Encounter for antineoplastic radiation therapy: Secondary | ICD-10-CM | POA: Diagnosis not present

## 2014-07-16 NOTE — Telephone Encounter (Signed)
Called patient to follow up from symptom management on 07/15/14. Patient stated she started antibiotic, No fever, chills or pain.  Reminded patient of appointments on 07/22/14.  Patient verbalized understanding.

## 2014-07-17 ENCOUNTER — Ambulatory Visit
Admission: RE | Admit: 2014-07-17 | Discharge: 2014-07-17 | Disposition: A | Discharge status: Home / Self Care | Payer: Managed Care, Other (non HMO) | Source: Ambulatory Visit | Attending: Radiation Oncology | Admitting: Radiation Oncology

## 2014-07-17 DIAGNOSIS — Z510 Encounter for antineoplastic radiation therapy: Secondary | ICD-10-CM | POA: Diagnosis not present

## 2014-07-18 ENCOUNTER — Ambulatory Visit
Admission: RE | Admit: 2014-07-18 | Discharge: 2014-07-18 | Disposition: A | Discharge status: Home / Self Care | Payer: Managed Care, Other (non HMO) | Source: Ambulatory Visit | Attending: Radiation Oncology | Admitting: Radiation Oncology

## 2014-07-18 DIAGNOSIS — Z510 Encounter for antineoplastic radiation therapy: Secondary | ICD-10-CM | POA: Diagnosis not present

## 2014-07-18 NOTE — Progress Notes (Signed)
Patient came to nursing following her 7th radiation treatment this morning to left chest wall. She has been taking Levaquin since 07/15/14 for possible left chest porta cath infection/cellulitis. She states she would like a nurse to look at her port site this morning because "it is red".  Area over her port is red, erythematous, no other symptoms noted. She has slight skin darkening of left chest wall radiation treatment area. Advised her that the darkening is an expected early skin reaction from her radiation treatments. She is applying Biafine lotion to treatment area.  Advised that if over the weekend she notices increased erythema, warmth, drainage, other signs of infection, she should seek medical attention. Advised she complete the antibiotic in it's entirety. Advised her to return tomorrow to see this RN for another skin check of her port site. Patient verbalized understanding, agreement of above plan.

## 2014-07-19 ENCOUNTER — Ambulatory Visit
Admission: RE | Admit: 2014-07-19 | Discharge: 2014-07-19 | Disposition: A | Discharge status: Home / Self Care | Payer: Managed Care, Other (non HMO) | Source: Ambulatory Visit | Attending: Radiation Oncology | Admitting: Radiation Oncology

## 2014-07-19 DIAGNOSIS — Z510 Encounter for antineoplastic radiation therapy: Secondary | ICD-10-CM | POA: Diagnosis not present

## 2014-07-19 NOTE — Progress Notes (Signed)
Rachel [name] was seen following her radiation treatment. The area of her left chest porta cath site is identical to yesterday, no increased redness, no swelling, no drainage, no warmth. She continues to take Levaquin as prescribed, drinks "plenty of water". Reminded her to observe this area a few times daily over the weekend looking for increased signs of redness, swelling, warmth and/or her developing a fever. Advised that she go to ED if these symptoms occur. Patient verbalized understanding, agreement. She will see Dr Valere Dross on 07/22/14 as well as Dr Burr Medico.

## 2014-07-22 ENCOUNTER — Encounter: Payer: Self-pay | Admitting: Hematology

## 2014-07-22 ENCOUNTER — Ambulatory Visit
Admission: RE | Admit: 2014-07-22 | Discharge: 2014-07-22 | Disposition: A | Discharge status: Home / Self Care | Payer: Managed Care, Other (non HMO) | Source: Ambulatory Visit | Attending: Radiation Oncology | Admitting: Radiation Oncology

## 2014-07-22 ENCOUNTER — Telehealth: Payer: Self-pay | Admitting: Hematology

## 2014-07-22 ENCOUNTER — Ambulatory Visit (HOSPITAL_BASED_OUTPATIENT_CLINIC_OR_DEPARTMENT_OTHER): Payer: Managed Care, Other (non HMO)

## 2014-07-22 ENCOUNTER — Ambulatory Visit (HOSPITAL_BASED_OUTPATIENT_CLINIC_OR_DEPARTMENT_OTHER): Payer: Managed Care, Other (non HMO) | Admitting: Hematology

## 2014-07-22 ENCOUNTER — Telehealth: Payer: Self-pay | Admitting: *Deleted

## 2014-07-22 ENCOUNTER — Other Ambulatory Visit (HOSPITAL_BASED_OUTPATIENT_CLINIC_OR_DEPARTMENT_OTHER): Payer: Managed Care, Other (non HMO)

## 2014-07-22 VITALS — BP 138/67 | HR 72 | Temp 98.2°F | Resp 19 | Ht 65.0 in | Wt 155.5 lb

## 2014-07-22 DIAGNOSIS — C50412 Malignant neoplasm of upper-outer quadrant of left female breast: Secondary | ICD-10-CM | POA: Diagnosis not present

## 2014-07-22 DIAGNOSIS — Z170 Estrogen receptor positive status [ER+]: Secondary | ICD-10-CM

## 2014-07-22 DIAGNOSIS — L03313 Cellulitis of chest wall: Secondary | ICD-10-CM

## 2014-07-22 DIAGNOSIS — Z5112 Encounter for antineoplastic immunotherapy: Secondary | ICD-10-CM | POA: Diagnosis not present

## 2014-07-22 DIAGNOSIS — E119 Type 2 diabetes mellitus without complications: Secondary | ICD-10-CM

## 2014-07-22 DIAGNOSIS — G629 Polyneuropathy, unspecified: Secondary | ICD-10-CM

## 2014-07-22 DIAGNOSIS — Z510 Encounter for antineoplastic radiation therapy: Secondary | ICD-10-CM | POA: Diagnosis not present

## 2014-07-22 DIAGNOSIS — I10 Essential (primary) hypertension: Secondary | ICD-10-CM

## 2014-07-22 DIAGNOSIS — D6481 Anemia due to antineoplastic chemotherapy: Secondary | ICD-10-CM

## 2014-07-22 LAB — COMPREHENSIVE METABOLIC PANEL (CC13)
ALT: 11 U/L (ref 0–55)
AST: 14 U/L (ref 5–34)
Albumin: 3.2 g/dL — ABNORMAL LOW (ref 3.5–5.0)
Alkaline Phosphatase: 74 U/L (ref 40–150)
Anion Gap: 8 mEq/L (ref 3–11)
BUN: 12.6 mg/dL (ref 7.0–26.0)
CALCIUM: 9.9 mg/dL (ref 8.4–10.4)
CO2: 27 mEq/L (ref 22–29)
CREATININE: 0.9 mg/dL (ref 0.6–1.1)
Chloride: 109 mEq/L (ref 98–109)
EGFR: 85 mL/min/{1.73_m2} — ABNORMAL LOW (ref >90)
GLUCOSE: 92 mg/dL (ref 70–140)
POTASSIUM: 4.7 meq/L (ref 3.5–5.1)
Sodium: 145 mEq/L (ref 136–145)
Total Bilirubin: 0.2 mg/dL (ref 0.20–1.20)
Total Protein: 7.2 g/dL (ref 6.4–8.3)

## 2014-07-22 LAB — CBC WITH DIFFERENTIAL/PLATELET
BASO%: 1 % (ref 0.0–2.0)
BASOS ABS: 0 10*3/uL (ref 0.0–0.1)
EOS%: 1.5 % (ref 0.0–7.0)
Eosinophils Absolute: 0.1 10*3/uL (ref 0.0–0.5)
HCT: 32 % — ABNORMAL LOW (ref 34.8–46.6)
HGB: 10.2 g/dL — ABNORMAL LOW (ref 11.6–15.9)
LYMPH%: 19 % (ref 14.0–49.7)
MCH: 28 pg (ref 25.1–34.0)
MCHC: 32 g/dL (ref 31.5–36.0)
MCV: 87.4 fL (ref 79.5–101.0)
MONO#: 0.3 10*3/uL (ref 0.1–0.9)
MONO%: 8.4 % (ref 0.0–14.0)
NEUT%: 70.1 % (ref 38.4–76.8)
NEUTROS ABS: 2.8 10*3/uL (ref 1.5–6.5)
PLATELETS: 231 10*3/uL (ref 145–400)
RBC: 3.66 10*6/uL — ABNORMAL LOW (ref 3.70–5.45)
RDW: 15.7 % — ABNORMAL HIGH (ref 11.2–14.5)
WBC: 4 10*3/uL (ref 3.9–10.3)
lymph#: 0.8 10*3/uL — ABNORMAL LOW (ref 0.9–3.3)

## 2014-07-22 MED ORDER — METHYLPREDNISOLONE SODIUM SUCC 40 MG IJ SOLR
40.0000 mg | Freq: Once | INTRAMUSCULAR | Status: AC
  Administered 2014-07-22: 40 mg via INTRAVENOUS

## 2014-07-22 MED ORDER — ACETAMINOPHEN 325 MG PO TABS
650.0000 mg | ORAL_TABLET | Freq: Once | ORAL | Status: AC
  Administered 2014-07-22: 650 mg via ORAL

## 2014-07-22 MED ORDER — TRASTUZUMAB CHEMO INJECTION 440 MG
6.0000 mg/kg | Freq: Once | INTRAVENOUS | Status: AC
  Administered 2014-07-22: 420 mg via INTRAVENOUS
  Filled 2014-07-22: qty 20

## 2014-07-22 MED ORDER — DIPHENHYDRAMINE HCL 25 MG PO CAPS
50.0000 mg | ORAL_CAPSULE | Freq: Once | ORAL | Status: AC
  Administered 2014-07-22: 50 mg via ORAL

## 2014-07-22 MED ORDER — ACETAMINOPHEN 325 MG PO TABS
ORAL_TABLET | ORAL | Status: AC
  Filled 2014-07-22: qty 2

## 2014-07-22 MED ORDER — METHYLPREDNISOLONE SODIUM SUCC 40 MG IJ SOLR
INTRAMUSCULAR | Status: AC
  Filled 2014-07-22: qty 1

## 2014-07-22 MED ORDER — SODIUM CHLORIDE 0.9 % IV SOLN
250.0000 mL | Freq: Once | INTRAVENOUS | Status: AC
  Administered 2014-07-22: 250 mL via INTRAVENOUS

## 2014-07-22 MED ORDER — DIPHENHYDRAMINE HCL 25 MG PO CAPS
ORAL_CAPSULE | ORAL | Status: AC
  Filled 2014-07-22: qty 2

## 2014-07-22 NOTE — Telephone Encounter (Signed)
Per staff message and POF I have scheduled appts. Advised scheduler of appts. JMW  

## 2014-07-22 NOTE — Progress Notes (Signed)
Weekly Management Note:  Site: Left chest wall/regional lymph nodes Current Dose:  1620  cGy Projected Dose: 5040  cGy followed by left chest wall boost  Narrative: The patient is seen today for routine under treatment assessment. CBCT/MVCT images/port films were reviewed. The chart was reviewed.   She is without complaints today.  She is on antibiotics just to make sure that she does not have a Port-A-Cath cellulitis.  She receives more Herceptin today.  Physical Examination: There were no vitals filed for this visit..  Weight:  .  There is still erythema along her Port-A-Cath which is essentially unchanged.  There is mild hyperpigmentation of the skin along left chest wall/axilla with no areas of dry desquamation.  Impression: Tolerating radiation therapy well.  I wonder if she has some type of hypersensitivity reaction rather than infection along her left Port-A-Cath.Dr. Burr Medico will see today.   Plan: Continue radiation therapy as planned.

## 2014-07-22 NOTE — Progress Notes (Signed)
[place] OFFICE PROGRESS NOTE  Patient Care Team: Seward Carol, MD as PCP - General (Internal Medicine) [name], MD as Consulting Physician (General Surgery)  SUMMARY OF ONCOLOGIC HISTORY: Oncology History   Breast cancer of upper-outer quadrant of left female breast   Staging form: Breast, AJCC 7th Edition     Clinical: Stage IIB (T3, N0, cM0) - Unsigned       Staging comments: Staged at breast conference 8.5.15      Pathologic stage from 05/08/2014: Stage IIA (yT1c(m), N1a, cM0) - Signed by Seward Grater, MD on 05/15/2014       Staging comments: Staged on final mastectomy specimen by Dr. Donato Heinz        Breast cancer of upper-outer quadrant of left female breast   05/08/2013 Surgery left mastectomy and SLN biopsy, negative margins. Path showed 2 lesions 0.5cm an d1.4cm, ympT1cpN1aMx   10/17/2013 Mammogram There is persistent distortion in the left upper outer quadrant, corresponding to the screening mammographic finding.    10/26/2013 Breast US Ultrasound is performed, showing a hypoechoic irregular mass with surrounding distortion in the left breast 2 o'clock location 4 cm from the nipple measuring 1.1 x 0.9 x 0.7 cm. This corresponds to the mammographic finding. 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 - CALCIFICATIONS IDENTIFIED   10/30/2013 Receptors her2 tumor molecular markers: ER 100%, POSITIVE, PR: 42%, POSITIVE, Ki67: 53%; HER2 (+) (ration 2.8, copy number 3.5)   11/05/2013 Breast MRI Irregular enhancing mass measuring 5.1 cm in the upper and upper-outer quadrants of the left breast. The MR enhancing mass is much larger than the mass seen mammographically or sonographically.    11/05/2013 Breast MRI Suspicious level 1 left axillary lymph node concerning for metastatic involving. But [place] negative, no node biopsy.    11/07/2013  Initial Diagnosis Breast cancer of upper-outer quadrant of left female breast   11/26/2013 - 03/11/2014 Chemotherapy 6 cycles of neoadjuvant chemotherapy with TCH-P (Taxotere, Carbolatin, Herceptin and Perjeta followed by Neulasta   04/08/2014 -  Chemotherapy Herceptin maintenance    05/08/2014 Surgery Left mastectomy, margins are negative. Surgical past showed multifocal invasive ductal carcinoma (0.5 cm, 1.4 cm), 2/2 SLN were positive.   07/10/2014 -  Radiation Therapy adjuvant radiation to breast and left axillar      CURRENT THERAPY: Herceptin maintenance therapy started on 04/08/2014, every 3 weeks, plan to finish in mind Aug 2016  Adjuvant radiation   INTERVAL HISTORY: She returns for follow up. She has been doing well overall. She had some chills after the prior cycle herceptin, and resolved after steroids. She started adjuvant radiation on 07/10/2014, has been tolerated well. She developed skin redness and swollen over the port area last week, was seen by our nurse practitioner and started antibiotic spectrum. She reports its getting better, not complete resolved, no tenderness, no fever or chills. She feels well overall.   REVIEW OF SYSTEMS:   Constitutional: Denies fevers, chills.  Eyes: Denies blurriness of vision Ears, nose, mouth, throat, and face: Denies mucositis or sore throat Respiratory: Denies cough, dyspnea or wheezes Cardiovascular: Denies palpitation, chest discomfort or lower extremity swelling Gastrointestinal:  Denies nausea, heartburn or change in bowel habits Skin: Denies abnormal skin rashes Lymphatics: Denies new lymphadenopathy or easy bruising Neurological:Denies numbness, tingling or new weaknesses Behavioral/Psych: Mood is stable, no new changes  All other systems were  reviewed with the patient and are negative except those in history   MEDICAL HISTORY:  Past Medical History  Diagnosis Date  . Breast cancer   . Hypertension   . Alopecia areata   .  Endometriosis   . Hypercholesteremia   . Diabetes mellitus without complication     controled by diet  . Status post chemotherapy 11/26/2013 - 03/11/2014    6 cycles of neoadjuvant chemotherapy with TCH-P (Taxotere, Carbolatin, Herceptin and Perjeta followed by Neulasta    SURGICAL HISTORY: Past Surgical History  Procedure Laterality Date  . Colonoscopy    . Portacath placement N/A 11/20/2013    Procedure: INSERTION PORT-A-CATH;  Surgeon: [name], MD;  Location: [place];  Service: General;  Laterality: N/A;  . Breast surgery Left 7/15    bx  . Mastectomy w/ sentinel node biopsy Left 05/08/2014    Procedure: LEFT MASTECTOMY WITH SENTINEL LYMPH NODE BIOPSY;  Surgeon: [name], MD;  Location: [facility];  Service: General;  Laterality: Left;  . Abdominal hysterectomy  [place], endometriosis    I have reviewed the social history and family history with the patient and they are unchanged from previous note.  ALLERGIES:  is allergic to eggs or egg-derived products; nickel; sulfa antibiotics; neosporin; multihance; penicillins; and tetanus toxoids.  MEDICATIONS:    Medication List       This list is accurate as of: 07/22/14 10:53 AM.  Always use your most recent med list.               acetaminophen 500 MG tablet  Commonly known as:  TYLENOL  Take 500 mg by mouth every 6 (six) hours as needed (pain).     calcium citrate-vitamin D 315-200 MG-UNIT per tablet  Commonly known as:  CITRACAL+D  Take 1 tablet by mouth daily.     HERCEPTIN IV  Inject 1 each into the vein See admin instructions. Every 3 weeks-unknown dose-last infusion 05/20/14     hydrochlorothiazide 25 MG tablet  Commonly known as:  HYDRODIURIL  Take 25 mg by mouth daily.     HYDROcodone-acetaminophen 5-325 MG per tablet  Commonly known as:  NORCO  Take 1-2 tablets by mouth every 4 (four) hours as needed for moderate pain or severe pain.     hydrocortisone cream 1 %  Apply 1 application topically 2  (two) times daily as needed for itching (rash).     levofloxacin 500 MG tablet  Commonly known as:  LEVAQUIN  Take 1 tablet (500 mg total) by mouth daily.     lidocaine-prilocaine cream  Commonly known as:  EMLA  Apply one application to port-a-cath 1 - 2 hours prior to access.     multivitamin with minerals Tabs tablet  Take 1 tablet by mouth daily.     promethazine 25 MG tablet  Commonly known as:  PHENERGAN  Take 25 mg by mouth as needed for nausea or vomiting (prior to MRI).         PHYSICAL EXAMINATION: ECOG PERFORMANCE STATUS: 0  Filed Vitals:   07/22/14 1017  BP: 138/67  Pulse: 72  Temp: 98.2 F (36.8 C)  Resp: 19   Filed Weights   07/22/14 1017  Weight: 155 lb 8 oz (70.534 kg)    GENERAL:alert, no distress and comfortable SKIN: skin color, texture, turgor are normal, no rashes or significant lesions except (+) mild redness and skin pigmentation over the port area on the upper left chest wall, no  tenderness or swelling  EYES: normal, Conjunctiva are pink and non-injected, sclera clear OROPHARYNX:no exudate, no erythema and lips, buccal mucosa, and tongue normal  NECK: supple, thyroid normal size, non-tender, without nodularity LYMPH:  no palpable lymphadenopathy in the cervical, axillary or inguinal LUNGS: clear to auscultation and percussion with normal breathing effort HEART: regular rate & rhythm and no murmurs and no lower extremity edema ABDOMEN:abdomen soft, non-tender and normal bowel sounds Musculoskeletal:no cyanosis of digits and no clubbing  NEURO: alert & oriented x 3 with fluent speech, no focal motor/sensory deficits Breasts: Status post left mastectomy, surgical wound healing well, draining tube in place. Palpation of the right breasts and axilla revealed no obvious mass that I could appreciate, except some fullness at the upper out quadrant of left breast.. EXTREMITIES: (+) Thickening of the nailbeds, no discharge from the left middle finger  nail bed, no surrounding skin edema or redness.   LABORATORY DATA:  I have reviewed the data as listed CBC Latest Ref Rng 07/22/2014 07/01/2014 06/10/2014  WBC 3.9 - 10.3 10e3/uL 4.0 5.4 6.5  Hemoglobin 11.6 - 15.9 g/dL 10.2(L) 10.4(L) 10.4(L)  Hematocrit 34.8 - 46.6 % 32.0(L) 32.4(L) 32.4(L)  Platelets 145 - 400 10e3/uL 231 201 365     CMP Latest Ref Rng 07/22/2014 07/01/2014 06/10/2014  Glucose 70 - 140 mg/dl 92 111 98  BUN 7.0 - 26.0 mg/dL 12.6 12.8 14.5  Creatinine 0.6 - 1.1 mg/dL 0.9 0.9 0.9  Sodium 136 - 145 mEq/L 145 144 145  Potassium 3.5 - 5.1 mEq/L 4.7 4.3 4.1  Chloride 96 - 112 mmol/L - - -  CO2 22 - 29 mEq/L 27 27 30(H)  Calcium 8.4 - 10.4 mg/dL 9.9 9.3 10.0  Total Protein 6.4 - 8.3 g/dL 7.2 7.4 7.0  Total Bilirubin 0.20 - 1.20 mg/dL 0.20 0.50 0.22  Alkaline Phos 40 - 150 U/L 74 74 70  AST 5 - 34 U/L '14 15 14  ' ALT 0 - 55 U/L '11 15 11   ' PATHOLOGY REPORT: Diagnosis 1. Breast, simple mastectomy, left 05/08/2014 - PENDING STAINS. - MULTIFOCAL INVASIVE DUCTAL CARCINOMA WITH NEOADJUVANT RELATED CHANGES, SEE COMMENT. - NEGATIVE FOR LYMPH VASCULAR INVASION. - INVASIVE TUMOR IS 4 MM FROM NEAREST MARGIN (ANTERIOR). - DUCTAL CARCINOMA IN SITU - PREVIOUS BIOPSY SITE. - BENIGN SKIN; NEGATIVE FOR TUMOR - SEE TUMOR SYNOPTIC TEMPLATE BELOW. 2. Lymph node, sentinel, biopsy, left axillary #1 - ONE LYMPH NODE, POSITIVE FOR METASTATIC MAMMARY CARCINOMA (1/1). - INTRANODAL TUMOR DEPOSIT IS 2 MM - NEGATIVE FOR EXTRACAPSULAR TUMOR EXTENSION. 3. Lymph node, sentinel, biopsy, left axillary #2 - ONE LYMPH NODE, POSITIVE FOR METASTATIC MAMMARY CARCINOMA (1/1). - INTRANODAL TUMOR DEPOSIT IS 4 MM - NEGATIVE FOR EXTRACAPSULAR TUMOR EXTENSION.  Microscopic Comment 1. BREAST, INVASIVE TUMOR, WITH LYMPH NODES PRESENT Specimen, including laterality and lymph node sampling (sentinel, non-sentinel): Left breast Procedure: Simple mastectomy Histologic type: Ductal (x3 tumors), see comment. Grade:  II of III (Tumor #1 and 2), see comment Tubule formation: 2 Nuclear pleomorphism: 3 Mitotic: 2 Grade: I of III (Tumor #3), see comment Tubule formation: 1 Nuclear pleomorphism: 1 Mitotic: 1 1 of 4 FINAL for ADAMS-FOUST, Breonna E ([id number]) Microscopic Comment(continued) Tumor size (glass slide measurement): 0.5 cm and 1.4 cm; gross measurement 2.0 cm, see comment. Margins: Invasive, distance to closest margin: 4 mm In-situ, distance to closest margin: 4 mm (anterior) If margin positive, focally or broadly: N/A Lymphovascular invasion: Absent Ductal carcinoma in situ: Present Grade: III of III Extensive intraductal component: Absent Lobular  neoplasia: Pending stains Tumor focality: Multifocal Treatment effect: Present If present, treatment effect in breast tissue, lymph nodes or both: Tissue Extent of tumor: Skin: Negative Nipple: Negative Skeletal muscle: Negative Lymph nodes: Examined: 2 Sentinel 0 Non-sentinel 2 Total Lymph nodes with metastasis: 2 Isolated tumor cells (< 0.2 mm): 0 Micrometastasis: (> 0.2 mm and < 2.0 mm): 0 Macrometastasis: (> 2.0 mm): X 2 Extracapsular extension: Absent Breast prognostic profile: Estrogen receptor: Not repeated, previous study demonstrated 100% positivity and 100% positivity ([id number] and [id number]) Progesterone receptor: Not repeated, previous study demonstrated 67%% and 42% positivity ([id number] and [id number]) Her 2 neu: Not repeated, previous study demonstrated amplification ([id number] and [id number]) Ki-67: Not repeated, previous study demonstrated 12% and 53% proliferation rate ([id number] and [id number]) Non-neoplastic breast: Previous biopsy site; fibrocystic change, usual ductal hyperplasia, calcifications and neoadjuvant related tissue changes. TNM: ympT1c, pN1a, pMX Comments: There are three different mass areas identified. The first central mass associated with barbell shaped clip demonstrates both  in situ and invasive ductal carcinoma. Within the focus, the invasive ductal carcinoma spans 5 mm. The second posterior medial mass demonstrates both in situ and invasive ductal carcinoma spanning the entire 2.0 cm grossly identified stellate area. The third and final lateral mass with associated heart shaped clip demonstrates both in situ and invasive ductal carcinoma. The invasive tumor spans 1.4cm and is involved by and away from previous biopsy site. The lack of the myoepithelial layer was confirmed with smooth muscle myosin heavy chain, Calponin, and p63 immunostains (slide 1K).. The presence of strong diffuse E-Cadherin expression and absence of cytokeratin 5/6 expression supports a ductal phenotype to the in situ carcinoma (slide 1K). (CR:kh 05-09-14) Mali RUND DO Pathologist, Electronic Signature (Case signed 05/13/2014) Specimen Gross and Clinical Information  RADIOGRAPHIC STUDIES: I have personally reviewed the radiological images as listed and agreed with the findings in the report.  ECHO 05/30/2014  - Left ventricle: Global longitudinal LV strain was calculated at -13.5 but this appears inaccurate due to inaccurate tracking. The cavity size was normal. Systolic function was normal. The estimated ejection fraction was in the range of 55% to 60%. Wall motion was normal; there were no regional wall motion abnormalities. There was an increased relative contribution of atrial contraction to ventricular filling. Doppler parameters are consistent with abnormal left ventricular relaxation (grade 1 diastolic dysfunction). - Aortic valve: A bicuspid morphology cannot be excluded. Moderate diffuse thickening and calcification. - Tricuspid valve: There was trivial regurgitation.  BREAST MRI 04/03/2014  IMPRESSION: 1. There has been significant improvement in the non mass enhancement in the upper-outer quadrant of the left breast. Persistent faint enhancement is  identified now measuring up to 4.4 cm. One of the biopsy sites is within the area of enhancement. The second biopsy site is not associated with persistent enhancement on today's exam. 2. No suspicious lymph nodes identified on today's exam.   ASSESSMENT & PLAN:  This is a very pleasant 59 years-old Afro-american female from [facility] who presents with a left Invasive Ductal Carcinoma. This was picked on a mammogram and maximal diameter was 1.7 cm on Ultrasound however the MRI breast showed this mass to be large almost 5.1 cm in maximal dimension making it a clinical T3 tumor. Also it showed a suspicious looking solitary lymph node in axilla but [place] was negative, not biopsied.  Clinical T3N0M0 Stage IIB.  The tumor biology indicates a LUMINAL-B type which means ER/PR/HER2 + tumor. Tumor is strongly ER+100%, PR 42%,KI-67 Index 53% showing a brisk mitotic  rate and grade is called grade 2 (but grading as per pathologist cannot be relied upon on initial biopsy).   1. 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  -her restaging breast MRI results showed good partial response -I discussed her surgical pathology findings with her in details. Unfortunately she had metastasis to 2 Sentinel lymph nodes. Axillary lymph node dissection was discussed, Dr. Barry Dienes, Dr. Valere Dross and me and we recommend to proceed ALND, which is the standard care for now. She  However declined surgery due to the concern of lymphedema. She is agreeable to have adjuvant radiation including the axillary area. All NORMAL FEMALE N -I'll continue her Herceptin every 3 weeks to complete a total of one year treatment, through 11/2014 -Premedication with solumedro, tylenol and Benadryl before Herceptin due to the reaction  -She will start hormonal therapy after she completes adjuvant  radiation -We'll monitor her heart function by repeating echo every 3 months. Her recent echo showed normal EF. Next due on 08/28/14   2. Left chest wall cellulitis at the port site -Continue Bactrim -do not use the port until the cellulitis resolves completely   3. Anemia, secondary to chemotherapy -stable   4. Grade 1 peripheral neuropathy, and a mild intermittent ankle swollen, nail changes.  Likely secondary to chemotherapy (docetaxel) -Continue observation  5. HTN, DM -Follow up with primary care physician -I suspect her primary syncope episode could be related to hypotension. She is not eating and drinking as well as usual.  -I told her to stop HCTZ, and watch her blood pressure carefully at home. She knows to resume HCTZ if her blood pressure becomes high.   Plan -cont herceptin q3w, treatment today, use the right arm for infusion, premedication -RTC in 3 weeks for next cycle -Do not access the port until her cellulitis completely resolves.   All questions were answered. The patient knows to call the clinic with any problems, questions or concerns. No barriers to learning was detected.  I spent 25 minutes counseling the patient face to face. The total time spent in the appointment was 30 minutes and more than 50% was on counseling and review of test results     Truitt Merle, MD 07/22/2014   10:53 AM

## 2014-07-22 NOTE — Progress Notes (Signed)
Weekly assessment of radiation to left chest wall.Skin with hyperpigmentation but no peeling.Completed 9 of 28 treatments.On antibiotic therapy for porta-cath infection.Denies pain.Mild fatigue.

## 2014-07-22 NOTE — Telephone Encounter (Signed)
Gave avs & calednar for April/May. Sent message to schedule treatment

## 2014-07-22 NOTE — Patient Instructions (Signed)

## 2014-07-23 ENCOUNTER — Ambulatory Visit
Admission: RE | Admit: 2014-07-23 | Discharge: 2014-07-23 | Disposition: A | Discharge status: Home / Self Care | Payer: Managed Care, Other (non HMO) | Source: Ambulatory Visit | Attending: Radiation Oncology | Admitting: Radiation Oncology

## 2014-07-23 DIAGNOSIS — Z510 Encounter for antineoplastic radiation therapy: Secondary | ICD-10-CM | POA: Diagnosis not present

## 2014-07-24 ENCOUNTER — Ambulatory Visit
Admission: RE | Admit: 2014-07-24 | Discharge: 2014-07-24 | Disposition: A | Discharge status: Home / Self Care | Payer: Managed Care, Other (non HMO) | Source: Ambulatory Visit | Attending: Radiation Oncology | Admitting: Radiation Oncology

## 2014-07-24 DIAGNOSIS — Z510 Encounter for antineoplastic radiation therapy: Secondary | ICD-10-CM | POA: Diagnosis not present

## 2014-07-25 ENCOUNTER — Encounter: Payer: Self-pay | Admitting: Hematology

## 2014-07-25 ENCOUNTER — Ambulatory Visit
Admission: RE | Admit: 2014-07-25 | Discharge: 2014-07-25 | Disposition: A | Discharge status: Home / Self Care | Payer: Managed Care, Other (non HMO) | Source: Ambulatory Visit | Attending: Radiation Oncology | Admitting: Radiation Oncology

## 2014-07-25 DIAGNOSIS — Z510 Encounter for antineoplastic radiation therapy: Secondary | ICD-10-CM | POA: Diagnosis not present

## 2014-07-25 NOTE — Progress Notes (Signed)
The patient is no longer on neulasta. I will deactivate the card

## 2014-07-26 ENCOUNTER — Ambulatory Visit
Admission: RE | Admit: 2014-07-26 | Discharge: 2014-07-26 | Disposition: A | Discharge status: Home / Self Care | Payer: Managed Care, Other (non HMO) | Source: Ambulatory Visit | Attending: Radiation Oncology | Admitting: Radiation Oncology

## 2014-07-26 DIAGNOSIS — Z510 Encounter for antineoplastic radiation therapy: Secondary | ICD-10-CM | POA: Diagnosis not present

## 2014-07-29 ENCOUNTER — Encounter: Payer: Self-pay | Admitting: Radiation Oncology

## 2014-07-29 ENCOUNTER — Ambulatory Visit
Admission: RE | Admit: 2014-07-29 | Discharge: 2014-07-29 | Disposition: A | Discharge status: Home / Self Care | Payer: Managed Care, Other (non HMO) | Source: Ambulatory Visit | Attending: Radiation Oncology | Admitting: Radiation Oncology

## 2014-07-29 VITALS — BP 131/75 | HR 70 | Temp 98.1°F | Resp 20 | Wt 157.1 lb

## 2014-07-29 DIAGNOSIS — C50412 Malignant neoplasm of upper-outer quadrant of left female breast: Secondary | ICD-10-CM

## 2014-07-29 DIAGNOSIS — Z510 Encounter for antineoplastic radiation therapy: Secondary | ICD-10-CM | POA: Diagnosis not present

## 2014-07-29 NOTE — Progress Notes (Signed)
Weekly Management Note:  Site: Left chest wall/regional lymph nodes Current Dose:  2520  cGy Projected Dose: 5040  cGy followed by left mastectomy scar boost  Narrative: The patient is seen today for routine under treatment assessment. CBCT/MVCT images/port films were reviewed. The chart was reviewed.   She is without complaints today.  She finished her Levaquin antibiotic last week.  Physical Examination:  Filed Vitals:   07/29/14 0902  BP: 131/75  Pulse: 70  Temp: 98.1 F (36.7 C)  Resp: 20  .  Weight: 157 lb 1.6 oz (71.26 kg).  There is mild hyperpigmentation the skin along the left chest wall and clavicular/axillary region.  Her left chest wall Port-A-Cath site is no longer erythematous.  No signs of infection.  Impression: Tolerating radiation therapy well.  Plan: Continue radiation therapy as planned.

## 2014-07-29 NOTE — Progress Notes (Signed)
Weekly rad tx 14 completed, hyperpigmentation on leftr chest wall, skin intact, port a cath area still red, finished her levaquin  Thursday 07/25/14 for this, appetite good no pain, slight fatigue at times, Herceptin q 3 weeks 9:00 AM

## 2014-07-30 ENCOUNTER — Ambulatory Visit
Admission: RE | Admit: 2014-07-30 | Discharge: 2014-07-30 | Disposition: A | Discharge status: Home / Self Care | Payer: Managed Care, Other (non HMO) | Source: Ambulatory Visit | Attending: Radiation Oncology | Admitting: Radiation Oncology

## 2014-07-30 ENCOUNTER — Ambulatory Visit: Payer: Managed Care, Other (non HMO)

## 2014-07-30 DIAGNOSIS — Z510 Encounter for antineoplastic radiation therapy: Secondary | ICD-10-CM | POA: Diagnosis not present

## 2014-07-31 ENCOUNTER — Ambulatory Visit: Payer: Managed Care, Other (non HMO)

## 2014-07-31 ENCOUNTER — Ambulatory Visit
Admission: RE | Admit: 2014-07-31 | Discharge: 2014-07-31 | Disposition: A | Discharge status: Home / Self Care | Payer: Managed Care, Other (non HMO) | Source: Ambulatory Visit | Attending: Radiation Oncology | Admitting: Radiation Oncology

## 2014-07-31 DIAGNOSIS — Z510 Encounter for antineoplastic radiation therapy: Secondary | ICD-10-CM | POA: Diagnosis not present

## 2014-08-01 ENCOUNTER — Ambulatory Visit
Admission: RE | Admit: 2014-08-01 | Discharge: 2014-08-01 | Disposition: A | Discharge status: Home / Self Care | Payer: Managed Care, Other (non HMO) | Source: Ambulatory Visit | Attending: Radiation Oncology | Admitting: Radiation Oncology

## 2014-08-01 DIAGNOSIS — Z510 Encounter for antineoplastic radiation therapy: Secondary | ICD-10-CM | POA: Diagnosis not present

## 2014-08-02 ENCOUNTER — Ambulatory Visit
Admission: RE | Admit: 2014-08-02 | Discharge: 2014-08-02 | Disposition: A | Discharge status: Home / Self Care | Payer: Managed Care, Other (non HMO) | Source: Ambulatory Visit | Attending: Radiation Oncology | Admitting: Radiation Oncology

## 2014-08-02 DIAGNOSIS — Z510 Encounter for antineoplastic radiation therapy: Secondary | ICD-10-CM | POA: Diagnosis not present

## 2014-08-05 ENCOUNTER — Ambulatory Visit
Admission: RE | Admit: 2014-08-05 | Discharge: 2014-08-05 | Disposition: A | Discharge status: Home / Self Care | Payer: Managed Care, Other (non HMO) | Source: Ambulatory Visit | Attending: Radiation Oncology | Admitting: Radiation Oncology

## 2014-08-05 ENCOUNTER — Telehealth: Payer: Self-pay | Admitting: *Deleted

## 2014-08-05 ENCOUNTER — Encounter: Payer: Self-pay | Admitting: Radiation Oncology

## 2014-08-05 VITALS — BP 120/78 | HR 93 | Temp 99.0°F | Resp 20 | Wt 153.1 lb

## 2014-08-05 DIAGNOSIS — L599 Disorder of the skin and subcutaneous tissue related to radiation, unspecified: Secondary | ICD-10-CM | POA: Insufficient documentation

## 2014-08-05 DIAGNOSIS — C50412 Malignant neoplasm of upper-outer quadrant of left female breast: Secondary | ICD-10-CM | POA: Insufficient documentation

## 2014-08-05 DIAGNOSIS — Z510 Encounter for antineoplastic radiation therapy: Secondary | ICD-10-CM | POA: Insufficient documentation

## 2014-08-05 MED ORDER — BIAFINE EX EMUL
Freq: Two times a day (BID) | CUTANEOUS | Status: DC
  Administered 2014-08-05: 09:00:00 via TOPICAL

## 2014-08-05 NOTE — Telephone Encounter (Signed)
Called Dr. Marlowe Aschoff office per Dr. Valere Dross request, patient to be seen ,patien's port a cath area reddened,puffy ,  Needs to be looked at, spoke with Lanny Cramp ,the Dr.'s triage line, appt for 3pm today,patient informed to arrive at 245pm per Jeanie Cooks RN 9:21 AM

## 2014-08-05 NOTE — Progress Notes (Signed)
Weekly rad txs LCW area tanning, slight swelling at port site, no c/o pain, using biafine bid, gave another tube, eating more protein, healthy diet, Herceptin due next Monday 9:06 AM

## 2014-08-05 NOTE — Progress Notes (Signed)
CC: Dr. [name]   Weekly Management Note:  Site: Left chest wall/regional lymph nodes Current Dose:  3420  cGy Projected Dose: 5040  cGy followed by left chest wall boost  Narrative: The patient is seen today for routine under treatment assessment. CBCT/MVCT images/port films were reviewed. The chart was reviewed.   She is generally doing well but does notice more "puffiness" along her left upper chest wall Port-A-Cath.  She was placed on antibiotics by medical oncology 2 weeks ago for suspected cellulitis.  The erythema then subsided.  There is now more erythema inferiorly with more soft tissue swelling.  Physical Examination:  Filed Vitals:   08/05/14 0904  BP: 120/78  Pulse: 93  Temp: 99 F (37.2 C)  Resp: 20  .  Weight: 153 lb 1.6 oz (69.446 kg).  There is hyperpigmentation of the left chest wall and axilla.  There is puffy erythema just below her left upper anterior chest Port-A-Cath.  There are a few superficial scabs.  There is no skin breakdown or drainage.  Impression: Tolerating radiation therapy well, although she does have more erythema and puffiness just below her left upper anterior chest Port-A-Cath.  I would like for Dr. Barry Dienes or one of her colleagues to take a look at her Port-A-Cath just to make sure that there is not a small abscess.  Plan: Continue radiation therapy as planned.

## 2014-08-06 ENCOUNTER — Ambulatory Visit
Admission: RE | Admit: 2014-08-06 | Discharge: 2014-08-06 | Disposition: A | Discharge status: Home / Self Care | Payer: Managed Care, Other (non HMO) | Source: Ambulatory Visit | Attending: Radiation Oncology | Admitting: Radiation Oncology

## 2014-08-06 DIAGNOSIS — Z510 Encounter for antineoplastic radiation therapy: Secondary | ICD-10-CM | POA: Diagnosis not present

## 2014-08-07 ENCOUNTER — Ambulatory Visit
Admission: RE | Admit: 2014-08-07 | Discharge: 2014-08-07 | Disposition: A | Discharge status: Home / Self Care | Payer: Managed Care, Other (non HMO) | Source: Ambulatory Visit | Attending: Radiation Oncology | Admitting: Radiation Oncology

## 2014-08-07 DIAGNOSIS — Z510 Encounter for antineoplastic radiation therapy: Secondary | ICD-10-CM | POA: Diagnosis not present

## 2014-08-08 ENCOUNTER — Ambulatory Visit
Admission: RE | Admit: 2014-08-08 | Discharge: 2014-08-08 | Disposition: A | Discharge status: Home / Self Care | Payer: Managed Care, Other (non HMO) | Source: Ambulatory Visit | Attending: Radiation Oncology | Admitting: Radiation Oncology

## 2014-08-08 DIAGNOSIS — Z510 Encounter for antineoplastic radiation therapy: Secondary | ICD-10-CM | POA: Diagnosis not present

## 2014-08-09 ENCOUNTER — Ambulatory Visit
Admission: RE | Admit: 2014-08-09 | Discharge: 2014-08-09 | Disposition: A | Discharge status: Home / Self Care | Payer: Managed Care, Other (non HMO) | Source: Ambulatory Visit | Attending: Radiation Oncology | Admitting: Radiation Oncology

## 2014-08-09 DIAGNOSIS — Z510 Encounter for antineoplastic radiation therapy: Secondary | ICD-10-CM | POA: Diagnosis not present

## 2014-08-12 ENCOUNTER — Ambulatory Visit: Payer: Managed Care, Other (non HMO) | Admitting: Radiation Oncology

## 2014-08-12 ENCOUNTER — Other Ambulatory Visit: Payer: Self-pay | Admitting: Radiation Oncology

## 2014-08-12 ENCOUNTER — Other Ambulatory Visit (HOSPITAL_BASED_OUTPATIENT_CLINIC_OR_DEPARTMENT_OTHER): Payer: Managed Care, Other (non HMO)

## 2014-08-12 ENCOUNTER — Other Ambulatory Visit: Payer: Self-pay | Admitting: *Deleted

## 2014-08-12 ENCOUNTER — Ambulatory Visit: Payer: Managed Care, Other (non HMO)

## 2014-08-12 ENCOUNTER — Telehealth: Payer: Self-pay | Admitting: Hematology

## 2014-08-12 ENCOUNTER — Ambulatory Visit
Admission: RE | Admit: 2014-08-12 | Discharge: 2014-08-12 | Disposition: A | Discharge status: Home / Self Care | Payer: Managed Care, Other (non HMO) | Source: Ambulatory Visit | Attending: Radiation Oncology | Admitting: Radiation Oncology

## 2014-08-12 ENCOUNTER — Other Ambulatory Visit: Payer: Self-pay | Admitting: Medical Oncology

## 2014-08-12 ENCOUNTER — Ambulatory Visit (HOSPITAL_BASED_OUTPATIENT_CLINIC_OR_DEPARTMENT_OTHER): Payer: Managed Care, Other (non HMO)

## 2014-08-12 ENCOUNTER — Ambulatory Visit (HOSPITAL_BASED_OUTPATIENT_CLINIC_OR_DEPARTMENT_OTHER): Payer: Managed Care, Other (non HMO) | Admitting: Hematology

## 2014-08-12 VITALS — BP 124/73 | HR 90 | Temp 98.0°F | Resp 18

## 2014-08-12 VITALS — BP 101/66 | HR 91 | Temp 99.0°F | Resp 12 | Wt 153.6 lb

## 2014-08-12 DIAGNOSIS — I10 Essential (primary) hypertension: Secondary | ICD-10-CM

## 2014-08-12 DIAGNOSIS — D6481 Anemia due to antineoplastic chemotherapy: Secondary | ICD-10-CM

## 2014-08-12 DIAGNOSIS — Z170 Estrogen receptor positive status [ER+]: Secondary | ICD-10-CM

## 2014-08-12 DIAGNOSIS — C50412 Malignant neoplasm of upper-outer quadrant of left female breast: Secondary | ICD-10-CM

## 2014-08-12 DIAGNOSIS — Z5112 Encounter for antineoplastic immunotherapy: Secondary | ICD-10-CM

## 2014-08-12 DIAGNOSIS — Z510 Encounter for antineoplastic radiation therapy: Secondary | ICD-10-CM | POA: Diagnosis not present

## 2014-08-12 DIAGNOSIS — E119 Type 2 diabetes mellitus without complications: Secondary | ICD-10-CM

## 2014-08-12 DIAGNOSIS — R6889 Other general symptoms and signs: Secondary | ICD-10-CM

## 2014-08-12 LAB — COMPREHENSIVE METABOLIC PANEL (CC13)
ALT: 15 U/L (ref 0–55)
ANION GAP: 12 meq/L — AB (ref 3–11)
AST: 12 U/L (ref 5–34)
Albumin: 3.6 g/dL (ref 3.5–5.0)
Alkaline Phosphatase: 87 U/L (ref 40–150)
BUN: 13.9 mg/dL (ref 7.0–26.0)
CHLORIDE: 102 meq/L (ref 98–109)
CO2: 28 mEq/L (ref 22–29)
Calcium: 10.2 mg/dL (ref 8.4–10.4)
Creatinine: 0.8 mg/dL (ref 0.6–1.1)
EGFR: 89 mL/min/{1.73_m2} — AB (ref >90)
Glucose: 100 mg/dl (ref 70–140)
POTASSIUM: 3.7 meq/L (ref 3.5–5.1)
Sodium: 142 mEq/L (ref 136–145)
TOTAL PROTEIN: 7.7 g/dL (ref 6.4–8.3)
Total Bilirubin: 0.58 mg/dL (ref 0.20–1.20)

## 2014-08-12 LAB — CBC WITH DIFFERENTIAL/PLATELET
BASO%: 0.4 % (ref 0.0–2.0)
Basophils Absolute: 0 10*3/uL (ref 0.0–0.1)
EOS%: 1.8 % (ref 0.0–7.0)
Eosinophils Absolute: 0.1 10*3/uL (ref 0.0–0.5)
HEMATOCRIT: 34.7 % — AB (ref 34.8–46.6)
HGB: 11.3 g/dL — ABNORMAL LOW (ref 11.6–15.9)
LYMPH#: 0.7 10*3/uL — AB (ref 0.9–3.3)
LYMPH%: 14.1 % (ref 14.0–49.7)
MCH: 28.1 pg (ref 25.1–34.0)
MCHC: 32.6 g/dL (ref 31.5–36.0)
MCV: 86.3 fL (ref 79.5–101.0)
MONO#: 0.6 10*3/uL (ref 0.1–0.9)
MONO%: 12.9 % (ref 0.0–14.0)
NEUT%: 70.8 % (ref 38.4–76.8)
NEUTROS ABS: 3.5 10*3/uL (ref 1.5–6.5)
Platelets: 182 10*3/uL (ref 145–400)
RBC: 4.02 10*6/uL (ref 3.70–5.45)
RDW: 15.9 % — ABNORMAL HIGH (ref 11.2–14.5)
WBC: 5 10*3/uL (ref 3.9–10.3)

## 2014-08-12 MED ORDER — METHYLPREDNISOLONE SODIUM SUCC 40 MG IJ SOLR
40.0000 mg | Freq: Once | INTRAMUSCULAR | Status: AC
  Administered 2014-08-12: 40 mg via INTRAVENOUS

## 2014-08-12 MED ORDER — TRASTUZUMAB CHEMO INJECTION 440 MG
6.0000 mg/kg | Freq: Once | INTRAVENOUS | Status: AC
  Administered 2014-08-12: 420 mg via INTRAVENOUS
  Filled 2014-08-12: qty 20

## 2014-08-12 MED ORDER — DIPHENHYDRAMINE HCL 25 MG PO CAPS
50.0000 mg | ORAL_CAPSULE | Freq: Once | ORAL | Status: AC
  Administered 2014-08-12: 50 mg via ORAL

## 2014-08-12 MED ORDER — SODIUM CHLORIDE 0.9 % IV SOLN
Freq: Once | INTRAVENOUS | Status: AC
  Administered 2014-08-12: 11:00:00 via INTRAVENOUS

## 2014-08-12 MED ORDER — ALRA NON-METALLIC DEODORANT (RAD-ONC)
1.0000 "application " | Freq: Once | TOPICAL | Status: AC
  Administered 2014-08-12: 1 via TOPICAL

## 2014-08-12 MED ORDER — DIPHENHYDRAMINE HCL 25 MG PO CAPS
ORAL_CAPSULE | ORAL | Status: AC
  Filled 2014-08-12: qty 2

## 2014-08-12 MED ORDER — METHYLPREDNISOLONE SODIUM SUCC 40 MG IJ SOLR
INTRAMUSCULAR | Status: AC
  Filled 2014-08-12: qty 1

## 2014-08-12 MED ORDER — ACETAMINOPHEN 325 MG PO TABS
ORAL_TABLET | ORAL | Status: AC
  Filled 2014-08-12: qty 2

## 2014-08-12 MED ORDER — ACETAMINOPHEN 325 MG PO TABS
650.0000 mg | ORAL_TABLET | Freq: Once | ORAL | Status: AC
Start: 2014-08-12 — End: 2014-08-12
  Administered 2014-08-12: 650 mg via ORAL

## 2014-08-12 NOTE — Addendum Note (Signed)
Encounter addended by: Jacqulyn Liner, RN on: 08/12/2014  9:32 AM<BR>     Documentation filed: Inpatient MAR

## 2014-08-12 NOTE — Progress Notes (Signed)
[facility] OFF[facility] PROGRESS NOTE  Patient Care Team: Seward Carol, MD as PCP - General (Internal Medicine) [name], MD as Consulting Physician (General Surgery)  SUMMARY OF ONCOLOGIC HISTORY: Oncology History   Breast cancer of upper-outer quadrant of left female breast   Staging form: Breast, AJCC 7th Edition     Clinical: Stage IIB (T3, N0, cM0) - Unsigned       Staging comments: Staged at breast conference 8.5.15      Pathologic stage from 05/08/2014: Stage IIA (yT1c(m), N1a, cM0) - Signed by Seward Grater, MD on 05/15/2014       Staging comments: Staged on final mastectomy specimen by Dr. Donato Heinz        Breast cancer of upper-outer quadrant of left female breast   05/08/2013 Surgery left mastectomy and SLN biopsy, negative margins. Path showed 2 lesions 0.5cm an d1.4cm, [id number]   10/17/2013 Mammogram There is persistent distortion in the left upper outer quadrant, corresponding to the screening mammographic finding.    10/26/2013 Breast US Ultrasound is performed, showing a hypoechoic irregular mass with surrounding distortion in the left breast 2 o'clock location 4 cm from the nipple measuring 1.1 x 0.9 x 0.7 cm. This corresponds to the mammographic finding. 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 - CALCIFICATIONS IDENTIFIED   10/30/2013 Receptors her2 tumor molecular markers: ER 100%, POSITIVE, PR: 42%, POSITIVE, Ki67: 53%; HER2 (+) (ration 2.8, copy number 3.5)   11/05/2013 Breast MRI Irregular enhancing mass measuring 5.1 cm in the upper and upper-outer quadrants of the left breast. The MR enhancing mass is much larger than the mass seen mammographically or sonographically.    11/05/2013 Breast MRI Suspicious level 1 left axillary lymph node concerning for metastatic involving. But [place] negative, no node biopsy.    11/07/2013  Initial Diagnosis Breast cancer of upper-outer quadrant of left female breast   11/26/2013 - 03/11/2014 Chemotherapy 6 cycles of neoadjuvant chemotherapy with TCH-P (Taxotere, Carbolatin, Herceptin and Perjeta followed by Neulasta   04/08/2014 -  Chemotherapy Herceptin maintenance    05/08/2014 Surgery Left mastectomy, margins are negative. Surgical past showed multifocal invasive ductal carcinoma (0.5 cm, 1.4 cm), 2/2 SLN were positive.   07/10/2014 -  Radiation Therapy adjuvant radiation to breast and left axillar      CURRENT THERAPY: Herceptin maintenance therapy started on 04/08/2014, every 3 weeks, plan to finish in mind Aug 2016  Adjuvant radiation   INTERVAL HISTORY: She returns for follow up and Herceptin treatment. She has been tolerating radiation well, she developed skin ulcer over the port area in up front chest, with mild tenderness, she was seen by Dr. Valere Dross, and wound culture was sent today. She denies any fever or chills, she otherwise feels well overall. She still on antibiotics Levaquin now.   REVIEW OF SYSTEMS:   Constitutional: Denies fevers, chills.  Eyes: Denies blurriness of vision Ears, nose, mouth, throat, and face: Denies mucositis or sore throat Respiratory: Denies cough, dyspnea or wheezes Cardiovascular: Denies palpitation, chest discomfort or lower extremity swelling Gastrointestinal:  Denies nausea, heartburn or change in bowel habits Skin: Denies abnormal skin rashes Lymphatics: Denies new lymphadenopathy or easy bruising Neurological:Denies numbness, tingling or new weaknesses Behavioral/Psych: Mood is stable, no new changes  All other systems were reviewed with the patient and are negative except those in history   MEDICAL HISTORY:  Past Medical History  Diagnosis Date  . Breast cancer   . Hypertension   . Alopecia areata   . Endometriosis   . Hypercholesteremia   . Diabetes mellitus without complication     controled by diet  . Status post chemotherapy  11/26/2013 - 03/11/2014    6 cycles of neoadjuvant chemotherapy with TCH-P (Taxotere, Carbolatin, Herceptin and Perjeta followed by Neulasta    SURGICAL HISTORY: Past Surgical History  Procedure Laterality Date  . Colonoscopy    . Portacath placement N/A 11/20/2013    Procedure: INSERTION PORT-A-CATH;  Surgeon: [name], MD;  Location: [facility];  Service: General;  Laterality: N/A;  . Breast surgery Left 7/15    bx  . Mastectomy w/ sentinel node biopsy Left 05/08/2014    Procedure: LEFT MASTECTOMY WITH SENTINEL LYMPH NODE BIOPSY;  Surgeon: [name], MD;  Location: [facility];  Service: General;  Laterality: Left;  . Abdominal hysterectomy  [place], endometriosis    I have reviewed the social history and family history with the patient and they are unchanged from previous note.  ALLERGIES:  is allergic to eggs or egg-derived products; nickel; sulfa antibiotics; neosporin; multihance; penicillins; and tetanus toxoids.  MEDICATIONS:    Medication List       This list is accurate as of: 08/12/14 11:11 AM.  Always use your most recent med list.               acetaminophen 500 MG tablet  Commonly known as:  TYLENOL  Take 500 mg by mouth every 6 (six) hours as needed (pain).     calcium citrate-vitamin D 315-200 MG-UNIT per tablet  Commonly known as:  CITRACAL+D  Take 1 tablet by mouth daily.     emollient cream  Commonly known as:  BIAFINE  Apply 1 application topically 2 (two) times daily.     HERCEPTIN IV  Inject 1 each into the vein See admin instructions. Every 3 weeks-unknown dose-last infusion 05/20/14     hydrochlorothiazide 25 MG tablet  Commonly known as:  HYDRODIURIL  Take 25 mg by mouth daily.     hydrocortisone cream 1 %  Apply 1 application topically 2 (two) times daily as needed for itching (rash).     levofloxacin 500 MG tablet  Commonly known as:  LEVAQUIN  Take 1 tablet (500 mg total) by mouth daily.     lidocaine-prilocaine cream  Commonly  known as:  EMLA  Apply one application to port-a-cath 1 - 2 hours prior to access.     multivitamin with minerals Tabs tablet  Take 1 tablet by mouth daily.     promethazine 25 MG tablet  Commonly known as:  PHENERGAN  Take 25 mg by mouth as needed for nausea or vomiting (prior to MRI).         PHYSICAL EXAMINATION: ECOG PERFORMANCE STATUS: 0  There were no vitals filed for this visit. There were no vitals filed for this visit.  GENERAL:alert, no distress and comfortable SKIN: skin color, texture, turgor are normal, no rashes or significant lesions except (+) mild redness and skin pigmentation over the port area on the upper left chest wall, and a small ulcer with yellowish discharge. EYES: normal, Conjunctiva are pink and non-injected, sclera clear OROPHARYNX:no exudate, no erythema and lips, buccal mucosa, and tongue normal  NECK: supple, thyroid normal size, non-tender, without nodularity LYMPH:  no palpable lymphadenopathy in the cervical, axillary or inguinal LUNGS: clear to auscultation and  percussion with normal breathing effort HEART: regular rate & rhythm and no murmurs and no lower extremity edema ABDOMEN:abdomen soft, non-tender and normal bowel sounds Musculoskeletal:no cyanosis of digits and no clubbing  NEURO: alert & oriented x 3 with fluent speech, no focal motor/sensory deficits Breasts: Status post left mastectomy, surgical wound healing well, draining tube in place. Palpation of the right breasts and axilla revealed no obvious mass that I could appreciate, except some fullness at the upper out quadrant of left breast.. EXTREMITIES: (+) Thickening of the nailbeds, no discharge from the left middle finger nail bed, no surrounding skin edema or redness.   LABORATORY DATA:  I have reviewed the data as listed CBC Latest Ref Rng 08/12/2014 07/22/2014 07/01/2014  WBC 3.9 - 10.3 10e3/uL 5.0 4.0 5.4  Hemoglobin 11.6 - 15.9 g/dL 11.3(L) 10.2(L) 10.4(L)  Hematocrit 34.8 -  46.6 % 34.7(L) 32.0(L) 32.4(L)  Platelets 145 - 400 10e3/uL 182 231 201     CMP Latest Ref Rng 08/12/2014 07/22/2014 07/01/2014  Glucose 70 - 140 mg/dl 100 92 111  BUN 7.0 - 26.0 mg/dL 13.9 12.6 12.8  Creatinine 0.6 - 1.1 mg/dL 0.8 0.9 0.9  Sodium 136 - 145 mEq/L 142 145 144  Potassium 3.5 - 5.1 mEq/L 3.7 4.7 4.3  Chloride 96 - 112 mmol/L - - -  CO2 22 - 29 mEq/L _0 Calcium 8.4 - 10.4 mg/dL 10.2 9.9 9.3  Total Protein 6.4 - 8.3 g/dL 7.7 7.2 7.4  Total Bilirubin 0.20 - 1.20 mg/dL 0.58 0.20 0.50  Alkaline Phos 40 - 150 U/L 87 74 74  AST 5 - 34 U/L _1 ALT 0 - 55 U/L _2 PATHOLOGY REPORT: Diagnosis 1. Breast, simple mastectomy, left 05/08/2014 - PENDING STAINS. - MULTIFOCAL INVASIVE DUCTAL CARCINOMA WITH NEOADJUVANT RELATED CHANGES, SEE COMMENT. - NEGATIVE FOR LYMPH VASCULAR INVASION. - INVASIVE TUMOR IS 4 MM FROM NEAREST MARGIN (ANTERIOR). - DUCTAL CARCINOMA IN SITU - PREVIOUS BIOPSY SITE. - BENIGN SKIN; NEGATIVE FOR TUMOR - SEE TUMOR SYNOPTIC TEMPLATE BELOW. 2. Lymph node, sentinel, biopsy, left axillary #1 - ONE LYMPH NODE, POSITIVE FOR METASTATIC MAMMARY CARCINOMA (1/1). - INTRANODAL TUMOR DEPOSIT IS 2 MM - NEGATIVE FOR EXTRACAPSULAR TUMOR EXTENSION. 3. Lymph node, sentinel, biopsy, left axillary #2 - ONE LYMPH NODE, POSITIVE FOR METASTATIC MAMMARY CARCINOMA (1/1). - INTRANODAL TUMOR DEPOSIT IS 4 MM - NEGATIVE FOR EXTRACAPSULAR TUMOR EXTENSION.  Microscopic Comment 1. BREAST, INVASIVE TUMOR, WITH LYMPH NODES PRESENT Specimen, including laterality and lymph node sampling (sentinel, non-sentinel): Left breast Procedure: Simple mastectomy Histologic type: Ductal (x3 tumors), see comment. Grade: II of III (Tumor #1 and 2), see comment Tubule formation: 2 Nuclear pleomorphism: 3 Mitotic: 2 Grade: I of III (Tumor #3), see comment Tubule formation: 1 Nuclear pleomorphism: 1 Mitotic: 1 1 of 4 FINAL for ADAMS-FOUST, Liliyana E ([id number]) Microscopic  Comment(continued) Tumor size (glass slide measurement): 0.5 cm and 1.4 cm; gross measurement 2.0 cm, see comment. Margins: Invasive, distance to closest margin: 4 mm In-situ, distance to closest margin: 4 mm (anterior) If margin positive, focally or broadly: N/A Lymphovascular invasion: Absent Ductal carcinoma in situ: Present Grade: III of III Extensive intraductal component: Absent Lobular neoplasia: Pending stains Tumor focality: Multifocal Treatment effect: Present If present, treatment effect in breast tissue, lymph nodes or both: Tissue Extent of tumor: Skin: Negative Nipple: Negative Skeletal muscle: Negative Lymph nodes: Examined: 2 Sentinel 0 Non-sentinel 2 Total Lymph nodes with metastasis: 2 Isolated tumor cells (<  0.2 mm): 0 Micrometastasis: (> 0.2 mm and < 2.0 mm): 0 Macrometastasis: (> 2.0 mm): X 2 Extracapsular extension: Absent Breast prognostic profile: Estrogen receptor: Not repeated, previous study demonstrated 100% positivity and 100% positivity ([id number] and [id number]) Progesterone receptor: Not repeated, previous study demonstrated 67%% and 42% positivity ([id number] and [id number]) Her 2 neu: Not repeated, previous study demonstrated amplification ([id number] and [id number]) Ki-67: Not repeated, previous study demonstrated 12% and 53% proliferation rate ([id number] and [id number]) Non-neoplastic breast: Previous biopsy site; fibrocystic change, usual ductal hyperplasia, calcifications and neoadjuvant related tissue changes. TNM: ympT1c, pN1a, pMX Comments: There are three different mass areas identified. The first central mass associated with barbell shaped clip demonstrates both in situ and invasive ductal carcinoma. Within the focus, the invasive ductal carcinoma spans 5 mm. The second posterior medial mass demonstrates both in situ and invasive ductal carcinoma spanning the entire 2.0 cm grossly identified stellate area. The third and  final lateral mass with associated heart shaped clip demonstrates both in situ and invasive ductal carcinoma. The invasive tumor spans 1.4cm and is involved by and away from previous biopsy site. The lack of the myoepithelial layer was confirmed with smooth muscle myosin heavy chain, Calponin, and p63 immunostains (slide 1K).. The presence of strong diffuse E-Cadherin expression and absence of cytokeratin 5/6 expression supports a ductal phenotype to the in situ carcinoma (slide 1K). (CR:kh 05-09-14) Mali RUND DO Pathologist, Electronic Signature (Case signed 05/13/2014) Specimen Gross and Clinical Information  RADIOGRAPHIC STUDIES: I have personally reviewed the radiological images as listed and agreed with the findings in the report.  ECHO 05/30/2014  - Left ventricle: Global longitudinal LV strain was calculated at -13.5 but this appears inaccurate due to inaccurate tracking. The cavity size was normal. Systolic function was normal. The estimated ejection fraction was in the range of 55% to 60%. Wall motion was normal; there were no regional wall motion abnormalities. There was an increased relative contribution of atrial contraction to ventricular filling. Doppler parameters are consistent with abnormal left ventricular relaxation (grade 1 diastolic dysfunction). - Aortic valve: A bicuspid morphology cannot be excluded. Moderate diffuse thickening and calcification. - Tricuspid valve: There was trivial regurgitation.  BREAST MRI 04/03/2014  IMPRESSION: 1. There has been significant improvement in the non mass enhancement in the upper-outer quadrant of the left breast. Persistent faint enhancement is identified now measuring up to 4.4 cm. One of the biopsy sites is within the area of enhancement. The second biopsy site is not associated with persistent enhancement on today's exam. 2. No suspicious lymph nodes identified on today's exam.   ASSESSMENT & PLAN:   This is a very pleasant 59 years-old Afro-american female from [facility] who presents with a left Invasive Ductal Carcinoma. This was picked on a mammogram and maximal diameter was 1.7 cm on Ultrasound however the MRI breast showed this mass to be large almost 5.1 cm in maximal dimension making it a clinical T3 tumor. Also it showed a suspicious looking solitary lymph node in axilla but [place] was negative, not biopsied.  Clinical T3N0M0 Stage IIB.  The tumor biology indicates a LUMINAL-B type which means ER/PR/HER2 + tumor. Tumor is strongly ER+100%, PR 42%,KI-67 Index 53% showing a brisk mitotic rate and grade is called grade 2 (but grading as per pathologist cannot be relied upon on initial biopsy).   1. 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  -her restaging breast MRI results showed good partial response -I discussed her surgical pathology findings with her in details. Unfortunately she had metastasis to 2 Sentinel lymph nodes. Axillary lymph node dissection was discussed, Dr. Barry Dienes, Dr. Valere Dross and me and we recommend to proceed ALND, which is the standard care for now. She  However declined surgery due to the concern of lymphedema. She is agreeable to have adjuvant radiation including the axillary area. All NORMAL FEMALE N -I'll continue her Herceptin every 3 weeks to complete a total of one year treatment, through 11/2014 -Premedication with solumedro, tylenol and Benadryl before Herceptin due to the reaction  -She will start hormonal therapy after she completes adjuvant radiation -We'll monitor her heart function by repeating echo every 3 months. Her recent echo showed normal EF. Next due on 08/28/14   2. Left chest wall cellulitis and skin ulcer over the port site -Continue Levaquin, I refilled for additional 7 days -We'll culture and blood  culture was sent today. I'll follow the results. -do not use the port until the cellulitis resolves completely   3. Anemia, secondary to chemotherapy -stable   4. Grade 1 peripheral neuropathy, and a mild intermittent ankle swollen, nail changes.  Likely secondary to chemotherapy (docetaxel) -Continue observation     5. HTN, DM -Follow up with primary care physician -I suspect her primary syncope episode could be related to hypotension. She is not eating and drinking as well as usual.  -I told her to stop HCTZ, and watch her blood pressure carefully at home. She knows to resume HCTZ if her blood pressure becomes high.   Plan -cont herceptin q3w, treatment today, use the right arm for infusion, premedication -RTC in 3 weeks for next cycle -Follow-up won't and blood culture from today -Continue Levaquin for 10 more days -She will follow with up Dr. Kathalene Frames every week  All questions were answered. The patient knows to call the clinic with any problems, questions or concerns. No barriers to learning was detected.  I spent 25 minutes counseling the patient face to face. The total time spent in the appointment was 30 minutes and more than 50% was on counseling and review of test results     Truitt Merle, MD 08/12/2014   11:11 AM

## 2014-08-12 NOTE — Patient Instructions (Signed)
[facility] Discharge Instructions for Patients Receiving Chemotherapy  Today you received the following chemotherapy agents:  Herceptin  To help prevent nausea and vomiting after your treatment, we encourage you to take your nausea medication as prescribed.   If you develop nausea and vomiting that is not controlled by your nausea medication, call the clinic.   BELOW ARE SYMPTOMS THAT SHOULD BE REPORTED IMMEDIATELY:  *FEVER GREATER THAN 100.5 F  *CHILLS WITH OR WITHOUT FEVER  NAUSEA AND VOMITING THAT IS NOT CONTROLLED WITH YOUR NAUSEA MEDICATION  *UNUSUAL SHORTNESS OF BREATH  *UNUSUAL BRUISING OR BLEEDING  TENDERNESS IN MOUTH AND THROAT WITH OR WITHOUT PRESENCE OF ULCERS  *URINARY PROBLEMS  *BOWEL PROBLEMS  UNUSUAL RASH Items with * indicate a potential emergency and should be followed up as soon as possible.  Feel free to call the clinic you have any questions or concerns. The clinic phone number is [phone].  Please show the CHEMO ALERT CARD at check-in to the Emergency Department and triage nurse.   

## 2014-08-12 NOTE — Progress Notes (Addendum)
She rates her pain as a 1 on a scale of 0-10. intermittent and dull over left breat. Pt complains of fatigue .  Pt left breast- positive for Hyperpigmentation, Pruritus and breast tenderness.  Pt denies edema. Pt continues to apply Biafine as directed. Noted yellow crusting from the port a cath site, pt reports discomfort of a 1 pain scale.   BP 101/66 mmHg  Pulse 91  Temp(Src) 99 F (37.2 C) (Oral)  Resp 12  Wt 153 lb 9.6 oz (69.673 kg)  SpO2 100%

## 2014-08-12 NOTE — Addendum Note (Signed)
Encounter addended by: Jenene Slicker, RN on: 08/12/2014  9:24 AM<BR>     Documentation filed: Notes Section

## 2014-08-12 NOTE — Progress Notes (Signed)
CC: Dr. [name], Dr. Truitt Merle  Weekly Management Note:  Site: Left chest wall/regional lymph nodes Current Dose:  4320  cGy Projected Dose: 5040  cGy followed by left mastectomy scar boost  Narrative: The patient is seen today for routine under treatment assessment. CBCT/MVCT images/port films were reviewed. The chart was reviewed.   She is without complaints today.  She denies fevers or chills.  She noted a small amount of cloudy drainage from her left Port-A-Cath site.  I spoke with Dr. Barry Dienes about her suspected Port-A-Cath infection last week and our plan is to finish her radiation therapy and then remove her Port-A-Cath recognizing that there probably will be some delay in wound healing.  She will finish her current radiation prescription this Friday, and will receive a mastectomy scar boost beginning next week, but this will be away from her Port-A-Cath site.  I think that her Port-A-Cath could be removed as early as next Monday recognizing that it may be a number weeks before this will finally heal.  She has antibiotics for at least a few more days.  Physical Examination:  Filed Vitals:   08/12/14 0858  BP: 101/66  Pulse: 91  Temp: 99 F (37.2 C)  Resp: 12  .  Weight: 153 lb 9.6 oz (69.673 kg).  There is erythema along her Port-A-Cath with 2-3 mm posteroinferior to the Port-A-Cath.  Impression: Tolerating radiation therapy well, except for soft tissue infection along her Port-A-Cath.  We will go ahead and culture this small pustule, and I would like for her to see Dr. Barry Dienes for consideration of Port-A-Cath removal early next week.  I explained to the patient that it may take many weeks for her Port-A-Cath wound to heal.  Plan: Continue radiation therapy as planned.

## 2014-08-12 NOTE — Telephone Encounter (Signed)
per pof to sch pt trmt q3 weeks-sent emailt o MW to sch pt appt

## 2014-08-12 NOTE — Addendum Note (Signed)
Encounter addended by: Jacqulyn Liner, RN on: 08/12/2014  9:31 AM<BR>     Documentation filed: Dx [employer], Demographics Visit, Orders

## 2014-08-13 ENCOUNTER — Ambulatory Visit
Admission: RE | Admit: 2014-08-13 | Discharge: 2014-08-13 | Disposition: A | Discharge status: Home / Self Care | Payer: Managed Care, Other (non HMO) | Source: Ambulatory Visit | Attending: Radiation Oncology | Admitting: Radiation Oncology

## 2014-08-13 ENCOUNTER — Telehealth: Payer: Self-pay | Admitting: *Deleted

## 2014-08-13 ENCOUNTER — Other Ambulatory Visit: Payer: Self-pay | Admitting: Hematology

## 2014-08-13 DIAGNOSIS — Z510 Encounter for antineoplastic radiation therapy: Secondary | ICD-10-CM | POA: Diagnosis not present

## 2014-08-13 DIAGNOSIS — C50412 Malignant neoplasm of upper-outer quadrant of left female breast: Secondary | ICD-10-CM

## 2014-08-13 MED ORDER — LEVOFLOXACIN 500 MG PO TABS: 500.0000 mg | ORAL_TABLET | Freq: Every day | ORAL | Status: DC

## 2014-08-13 NOTE — Telephone Encounter (Signed)
TC from patient regarding antibiotics for infection at port a cath site.  She is asking if she needs to continue antibiotics and if so she needs a refill. Note from Dr. Burr Medico states she is to stay on Bactrim, however pt states she was given a 7 day course of Levaquin. Please advise.

## 2014-08-13 NOTE — Progress Notes (Signed)
Electron beam simulation note: The patient was taken to the [facility].  She was set up en face to her left chest wall/mastectomy scar.  One custom block was constructed to conform the field.  0.8 cm custom bolus is to be constructed in applied to her skin on the first day of her treatment.  A Rad Calc was performed.

## 2014-08-13 NOTE — Telephone Encounter (Signed)
Please let her know that I refilled her Levaquin for 7 days, description was sent to [facility] in [facility].  Truitt Merle  08/13/2014

## 2014-08-14 ENCOUNTER — Encounter: Payer: Self-pay | Admitting: Hematology

## 2014-08-14 ENCOUNTER — Ambulatory Visit
Admission: RE | Admit: 2014-08-14 | Discharge: 2014-08-14 | Disposition: A | Discharge status: Home / Self Care | Payer: Managed Care, Other (non HMO) | Source: Ambulatory Visit | Attending: Radiation Oncology | Admitting: Radiation Oncology

## 2014-08-14 DIAGNOSIS — Z510 Encounter for antineoplastic radiation therapy: Secondary | ICD-10-CM | POA: Diagnosis not present

## 2014-08-14 NOTE — Telephone Encounter (Signed)
VM left for patient regarding her prescription for levaquin being available at [facility] in [facility].

## 2014-08-15 ENCOUNTER — Telehealth: Payer: Self-pay | Admitting: *Deleted

## 2014-08-15 ENCOUNTER — Ambulatory Visit
Admission: RE | Admit: 2014-08-15 | Discharge: 2014-08-15 | Disposition: A | Discharge status: Home / Self Care | Payer: Managed Care, Other (non HMO) | Source: Ambulatory Visit | Attending: Radiation Oncology | Admitting: Radiation Oncology

## 2014-08-15 ENCOUNTER — Ambulatory Visit: Payer: Managed Care, Other (non HMO) | Admitting: Radiation Oncology

## 2014-08-15 DIAGNOSIS — Z510 Encounter for antineoplastic radiation therapy: Secondary | ICD-10-CM | POA: Diagnosis not present

## 2014-08-15 LAB — WOUND CULTURE

## 2014-08-15 NOTE — Telephone Encounter (Signed)
TC from patient to see if results of blood culture from 08/12/14 are back yet.  Informed patient they are not back quite yet.  She continues her antibiotics as ordered and will wait for call from Dr. Burr Medico re: blood culture results. No other needs identified.

## 2014-08-15 NOTE — Telephone Encounter (Signed)
Called pt at home and informed pt re:  Per Dr. Burr Medico - wound culture showed small amount of pseudomonas, which was sensitive to Levaquin.  MD had refilled Levaquin on 08/13/14.  Pt stated she had just completed Doxycycline ( Dr. Burr Medico aware ).  Pt will start Levaquin today and will complete the course of antibiotics.

## 2014-08-16 ENCOUNTER — Ambulatory Visit
Admission: RE | Admit: 2014-08-16 | Discharge: 2014-08-16 | Disposition: A | Discharge status: Home / Self Care | Payer: Managed Care, Other (non HMO) | Source: Ambulatory Visit | Attending: Radiation Oncology | Admitting: Radiation Oncology

## 2014-08-16 DIAGNOSIS — Z510 Encounter for antineoplastic radiation therapy: Secondary | ICD-10-CM | POA: Diagnosis not present

## 2014-08-19 ENCOUNTER — Telehealth: Payer: Self-pay

## 2014-08-19 ENCOUNTER — Ambulatory Visit
Admission: RE | Admit: 2014-08-19 | Discharge: 2014-08-19 | Disposition: A | Discharge status: Home / Self Care | Payer: Managed Care, Other (non HMO) | Source: Ambulatory Visit | Attending: Radiation Oncology | Admitting: Radiation Oncology

## 2014-08-19 VITALS — BP 114/73 | HR 80 | Temp 98.0°F | Resp 12 | Wt 152.9 lb

## 2014-08-19 DIAGNOSIS — C50412 Malignant neoplasm of upper-outer quadrant of left female breast: Secondary | ICD-10-CM | POA: Diagnosis not present

## 2014-08-19 DIAGNOSIS — Z510 Encounter for antineoplastic radiation therapy: Secondary | ICD-10-CM | POA: Diagnosis not present

## 2014-08-19 LAB — CULTURE, BLOOD (SINGLE)

## 2014-08-19 MED ORDER — BIAFINE EX EMUL
Freq: Two times a day (BID) | CUTANEOUS | Status: DC
  Administered 2014-08-19: 13:00:00 via TOPICAL

## 2014-08-19 NOTE — Progress Notes (Signed)
CC: Dr. [name], Dr. Truitt Merle  Weekly Management Note:  Site: Left mastectomy scar/chest wall boost Current Dose:  200  cGy Projected Dose: 1000  CGy (left chest wall/regional lymph node photon fields are completed)  Narrative: The patient is seen today for routine under treatment assessment. CBCT/MVCT images/port films were reviewed. The chart was reviewed.   She is without complaints today.  She still has slight drainage from her Port-A-Cath wound.  Her Gram stain from her wound cultures showed pseudomonas and she did have growth of pseudomonas.  Dr. Burr Medico extended her antibiotics.  I understand that she also obtained a blood culture, according to the patient.  She has not yet been scheduled for removal of her Port-A-Cath.  I did speak with Dr. Barry Dienes last week.  Physical Examination:  Filed Vitals:   08/19/14 0815  BP: 114/73  Pulse: 80  Temp: 98 F (36.7 C)  Resp: 12  .  Weight: 152 lb 14.4 oz (69.355 kg).  There is hyperpigmentation the skin along the left chest wall and clavicular/axillary regions.  I do not see any active drainage from her Port-A-Cath wound.  There is surrounding erythema which is essentially unchanged.  Impression: Tolerating radiation therapy well.  We are now off her Port-A-Cath site, in her Port-A-Cath can be removed.  I told the patient that her healing will be delayed because of her recent radiation therapy.  Plan: Continue radiation therapy as planned.  She is scheduled to complete her radiation therapy this Friday.

## 2014-08-19 NOTE — Progress Notes (Signed)
She rates her pain as a 1 on a scale of 0-10. intermittent over left breast.  Pt left breast- positive for Hyperpigmentation and breast tenderness.  Pt denies edema. Pt continues to apply Radiaplex and Biafine as directed. Noted area of yellow crusting remains present but has improved since last visit, distal to port a cath. Pt continues on Levaquin and thin layer of Biafine.  BP 114/73 mmHg  Pulse 80  Temp(Src) 98 F (36.7 C) (Oral)  Resp 12  Wt 152 lb 14.4 oz (69.355 kg)  SpO2 100%

## 2014-08-19 NOTE — Addendum Note (Signed)
Encounter addended by: Jenene Slicker, RN on: 08/19/2014 12:39 PM<BR>     Documentation filed: [facility] [employer], Inpatient MAR, Orders

## 2014-08-19 NOTE — Telephone Encounter (Signed)
Pt called stating Dr Burr Medico ask her to remind Dr Burr Medico to call Dr Barry Dienes. This is related to her blood culture and PAC surgery. And what is her next step. Pt also stated Dr Burr Medico said she would call pt with this information.

## 2014-08-20 ENCOUNTER — Ambulatory Visit
Admission: RE | Admit: 2014-08-20 | Discharge: 2014-08-20 | Disposition: A | Discharge status: Home / Self Care | Payer: Managed Care, Other (non HMO) | Source: Ambulatory Visit | Attending: Radiation Oncology | Admitting: Radiation Oncology

## 2014-08-20 ENCOUNTER — Telehealth: Payer: Self-pay | Admitting: *Deleted

## 2014-08-20 ENCOUNTER — Encounter (HOSPITAL_BASED_OUTPATIENT_CLINIC_OR_DEPARTMENT_OTHER): Payer: Self-pay | Admitting: *Deleted

## 2014-08-20 DIAGNOSIS — Z510 Encounter for antineoplastic radiation therapy: Secondary | ICD-10-CM | POA: Diagnosis not present

## 2014-08-20 NOTE — Telephone Encounter (Signed)
Patient called asking about a rx that was to be called in to her pharmacy on 08/12/14 about her port acath per Dr. Valere Dross, she called her pharmacy and ins co and neither has received that rx, she asked to speak with CherylCheston, RN, I infomred her that both Dr. Valere Dross and Malachy Mood were out of the office, and cheryl would be back this afternoon, I checked MD'S note on 08/12/14 ,no mention of rx cream for this patient porta acth, I will Have Malachy Mood contact her this afternoon 2:15 PM

## 2014-08-20 NOTE — Progress Notes (Signed)
NPO AFTER MN.  ARRIVE AT 1660.  CURRENT LAB RESULTS AND EKG IN CHART AND EPIC.

## 2014-08-21 ENCOUNTER — Ambulatory Visit
Admission: RE | Admit: 2014-08-21 | Discharge: 2014-08-21 | Disposition: A | Discharge status: Home / Self Care | Payer: Managed Care, Other (non HMO) | Source: Ambulatory Visit | Attending: Radiation Oncology | Admitting: Radiation Oncology

## 2014-08-21 ENCOUNTER — Ambulatory Visit: Payer: Managed Care, Other (non HMO)

## 2014-08-21 ENCOUNTER — Telehealth: Payer: Self-pay | Admitting: Radiation Oncology

## 2014-08-21 DIAGNOSIS — Z510 Encounter for antineoplastic radiation therapy: Secondary | ICD-10-CM | POA: Diagnosis not present

## 2014-08-21 NOTE — Telephone Encounter (Signed)
Dr. Burr Medico took care of this.

## 2014-08-21 NOTE — Telephone Encounter (Signed)
Phoned patient as promised after speaking with Dr. Valere Dross. Explained per Dr. Charlton Amor order that since Dr. Burr Medico continued her oral antibiotics the Bactraban topical ointment isn't needed. Patient verbalized understanding and expressed appreciation for the call.

## 2014-08-21 NOTE — Progress Notes (Signed)
Received patient in the clinic following treatment. Patient scheduled to have her power port surgically removed tomorrow. Questioned if patient plans to come in early tomorrow before surgery for treatment. Ultimately, patient decided to receive radiation treatment tomorrow at 0750 then, procedure onto surgery at 1015. Patient questions the name of the topical antibiotic Dr. Valere Dross suggested. Explained Dr. Valere Dross is in a consult and this RN will speak with him then, call her.

## 2014-08-22 ENCOUNTER — Ambulatory Visit (HOSPITAL_BASED_OUTPATIENT_CLINIC_OR_DEPARTMENT_OTHER): Payer: Managed Care, Other (non HMO) | Admitting: Anesthesiology

## 2014-08-22 ENCOUNTER — Encounter: Payer: Self-pay | Admitting: Radiation Oncology

## 2014-08-22 ENCOUNTER — Ambulatory Visit (HOSPITAL_BASED_OUTPATIENT_CLINIC_OR_DEPARTMENT_OTHER)
Admission: RE | Admit: 2014-08-22 | Discharge: 2014-08-22 | Disposition: A | Discharge status: Home / Self Care | Payer: Managed Care, Other (non HMO) | Source: Ambulatory Visit | Attending: General Surgery | Admitting: General Surgery

## 2014-08-22 ENCOUNTER — Encounter (HOSPITAL_BASED_OUTPATIENT_CLINIC_OR_DEPARTMENT_OTHER)
Admission: RE | Disposition: A | Discharge status: Home / Self Care | Payer: Self-pay | Source: Ambulatory Visit | Attending: General Surgery

## 2014-08-22 ENCOUNTER — Ambulatory Visit
Admission: RE | Admit: 2014-08-22 | Discharge: 2014-08-22 | Disposition: A | Discharge status: Home / Self Care | Payer: Managed Care, Other (non HMO) | Source: Ambulatory Visit | Attending: Radiation Oncology | Admitting: Radiation Oncology

## 2014-08-22 ENCOUNTER — Encounter (HOSPITAL_BASED_OUTPATIENT_CLINIC_OR_DEPARTMENT_OTHER): Payer: Self-pay | Admitting: *Deleted

## 2014-08-22 DIAGNOSIS — Z923 Personal history of irradiation: Secondary | ICD-10-CM | POA: Insufficient documentation

## 2014-08-22 DIAGNOSIS — E119 Type 2 diabetes mellitus without complications: Secondary | ICD-10-CM | POA: Insufficient documentation

## 2014-08-22 DIAGNOSIS — I10 Essential (primary) hypertension: Secondary | ICD-10-CM | POA: Insufficient documentation

## 2014-08-22 DIAGNOSIS — Z510 Encounter for antineoplastic radiation therapy: Secondary | ICD-10-CM | POA: Diagnosis not present

## 2014-08-22 DIAGNOSIS — Z792 Long term (current) use of antibiotics: Secondary | ICD-10-CM | POA: Insufficient documentation

## 2014-08-22 DIAGNOSIS — Z853 Personal history of malignant neoplasm of breast: Secondary | ICD-10-CM | POA: Diagnosis not present

## 2014-08-22 DIAGNOSIS — Z79899 Other long term (current) drug therapy: Secondary | ICD-10-CM | POA: Insufficient documentation

## 2014-08-22 DIAGNOSIS — T80219A Unspecified infection due to central venous catheter, initial encounter: Secondary | ICD-10-CM | POA: Diagnosis not present

## 2014-08-22 DIAGNOSIS — Z7952 Long term (current) use of systemic steroids: Secondary | ICD-10-CM | POA: Diagnosis not present

## 2014-08-22 DIAGNOSIS — Y929 Unspecified place or not applicable: Secondary | ICD-10-CM | POA: Diagnosis not present

## 2014-08-22 DIAGNOSIS — Z9221 Personal history of antineoplastic chemotherapy: Secondary | ICD-10-CM | POA: Insufficient documentation

## 2014-08-22 DIAGNOSIS — Z9012 Acquired absence of left breast and nipple: Secondary | ICD-10-CM | POA: Diagnosis not present

## 2014-08-22 DIAGNOSIS — Y838 Other surgical procedures as the cause of abnormal reaction of the patient, or of later complication, without mention of misadventure at the time of the procedure: Secondary | ICD-10-CM | POA: Insufficient documentation

## 2014-08-22 DIAGNOSIS — G620 Drug-induced polyneuropathy: Secondary | ICD-10-CM | POA: Diagnosis not present

## 2014-08-22 HISTORY — DX: Anemia due to antineoplastic chemotherapy: T45.1X5A

## 2014-08-22 HISTORY — DX: Type 2 diabetes mellitus without complications: E11.9

## 2014-08-22 HISTORY — DX: Hyperlipidemia, unspecified: E78.5

## 2014-08-22 HISTORY — DX: Personal history of other diseases of the female genital tract: Z87.42

## 2014-08-22 HISTORY — DX: Anemia due to antineoplastic chemotherapy: D64.81

## 2014-08-22 HISTORY — PX: PORT-A-CATH REMOVAL: SHX5289

## 2014-08-22 HISTORY — DX: Drug-induced polyneuropathy: G62.0

## 2014-08-22 HISTORY — DX: Unspecified infection due to central venous catheter, initial encounter: T80.219A

## 2014-08-22 LAB — POCT I-STAT, CHEM 8
BUN: 17 mg/dL (ref 6–20)
CHLORIDE: 102 mmol/L (ref 101–111)
CREATININE: 0.9 mg/dL (ref 0.44–1.00)
Calcium, Ion: 1.27 mmol/L — ABNORMAL HIGH (ref 1.12–1.23)
GLUCOSE: 101 mg/dL — AB (ref 65–99)
HEMATOCRIT: 37 % (ref 36.0–46.0)
HEMOGLOBIN: 12.6 g/dL (ref 12.0–15.0)
POTASSIUM: 3.8 mmol/L (ref 3.5–5.1)
Sodium: 144 mmol/L (ref 135–145)
TCO2: 26 mmol/L (ref 0–100)

## 2014-08-22 SURGERY — REMOVAL PORT-A-CATH
Anesthesia: Monitor Anesthesia Care | Site: Shoulder | Laterality: Left

## 2014-08-22 MED ORDER — DEXAMETHASONE SODIUM PHOSPHATE 4 MG/ML IJ SOLN
INTRAMUSCULAR | Status: DC | PRN
  Administered 2014-08-22: 10 mg via INTRAVENOUS

## 2014-08-22 MED ORDER — ACETAMINOPHEN 325 MG PO TABS
ORAL_TABLET | ORAL | Status: AC
  Filled 2014-08-22: qty 2

## 2014-08-22 MED ORDER — SODIUM CHLORIDE 0.9 % IJ SOLN
3.0000 mL | Freq: Two times a day (BID) | INTRAMUSCULAR | Status: DC
  Filled 2014-08-22: qty 3

## 2014-08-22 MED ORDER — BUPIVACAINE-EPINEPHRINE 0.25% -1:200000 IJ SOLN
INTRAMUSCULAR | Status: DC | PRN
  Administered 2014-08-22: 6 mL

## 2014-08-22 MED ORDER — PROPOFOL 500 MG/50ML IV EMUL
INTRAVENOUS | Status: DC | PRN
  Administered 2014-08-22: 75 ug/kg/min via INTRAVENOUS

## 2014-08-22 MED ORDER — CIPROFLOXACIN IN D5W 400 MG/200ML IV SOLN
INTRAVENOUS | Status: AC
  Filled 2014-08-22: qty 200

## 2014-08-22 MED ORDER — MIDAZOLAM HCL 5 MG/5ML IJ SOLN
INTRAMUSCULAR | Status: DC | PRN
  Administered 2014-08-22: 2 mg via INTRAVENOUS

## 2014-08-22 MED ORDER — ONDANSETRON HCL 4 MG/2ML IJ SOLN
INTRAMUSCULAR | Status: DC | PRN
  Administered 2014-08-22: 4 mg via INTRAVENOUS

## 2014-08-22 MED ORDER — CIPROFLOXACIN IN D5W 400 MG/200ML IV SOLN
INTRAVENOUS | Status: DC | PRN
  Administered 2014-08-22: 400 mg via INTRAVENOUS

## 2014-08-22 MED ORDER — SODIUM CHLORIDE 0.9 % IV SOLN
250.0000 mL | INTRAVENOUS | Status: DC | PRN
  Filled 2014-08-22: qty 250

## 2014-08-22 MED ORDER — FENTANYL CITRATE (PF) 100 MCG/2ML IJ SOLN
INTRAMUSCULAR | Status: DC | PRN
  Administered 2014-08-22: 50 ug via INTRAVENOUS
  Administered 2014-08-22: 25 ug via INTRAVENOUS

## 2014-08-22 MED ORDER — SODIUM CHLORIDE 0.9 % IJ SOLN
3.0000 mL | INTRAMUSCULAR | Status: DC | PRN
  Filled 2014-08-22: qty 3

## 2014-08-22 MED ORDER — LIDOCAINE-EPINEPHRINE (PF) 1 %-1:200000 IJ SOLN
INTRAMUSCULAR | Status: DC | PRN
  Administered 2014-08-22: 6 mL

## 2014-08-22 MED ORDER — OXYCODONE HCL 5 MG PO TABS
5.0000 mg | ORAL_TABLET | ORAL | Status: DC | PRN
  Filled 2014-08-22: qty 2

## 2014-08-22 MED ORDER — FENTANYL CITRATE (PF) 100 MCG/2ML IJ SOLN
INTRAMUSCULAR | Status: AC
  Filled 2014-08-22: qty 2

## 2014-08-22 MED ORDER — ACETAMINOPHEN 325 MG PO TABS
650.0000 mg | ORAL_TABLET | ORAL | Status: DC | PRN
  Administered 2014-08-22: 650 mg via ORAL
  Filled 2014-08-22: qty 2

## 2014-08-22 MED ORDER — SODIUM CHLORIDE 0.9 % IR SOLN
Status: DC | PRN
  Administered 2014-08-22: 500 mL

## 2014-08-22 MED ORDER — ACETAMINOPHEN 650 MG RE SUPP
650.0000 mg | RECTAL | Status: DC | PRN
  Filled 2014-08-22: qty 1

## 2014-08-22 MED ORDER — LACTATED RINGERS IV SOLN
INTRAVENOUS | Status: DC
  Administered 2014-08-22: 09:00:00 via INTRAVENOUS
  Filled 2014-08-22: qty 1000

## 2014-08-22 MED ORDER — OXYCODONE-ACETAMINOPHEN 5-325 MG PO TABS: 1.0000 | ORAL_TABLET | ORAL | Status: DC | PRN

## 2014-08-22 MED ORDER — LIDOCAINE HCL (CARDIAC) 20 MG/ML IV SOLN
INTRAVENOUS | Status: DC | PRN
  Administered 2014-08-22: 50 mg via INTRAVENOUS

## 2014-08-22 MED ORDER — MIDAZOLAM HCL 2 MG/2ML IJ SOLN
INTRAMUSCULAR | Status: AC
  Filled 2014-08-22: qty 2

## 2014-08-22 SURGICAL SUPPLY — 45 items
APL SKNCLS STERI-STRIP NONHPOA (GAUZE/BANDAGES/DRESSINGS)
BENZOIN TINCTURE PRP APPL 2/3 (GAUZE/BANDAGES/DRESSINGS) IMPLANT
BINDER BREAST MEDIUM (GAUZE/BANDAGES/DRESSINGS) ×2 IMPLANT
BLADE CLIPPER SURG (BLADE) IMPLANT
BLADE HEX COATED 2.75 (ELECTRODE) ×1 IMPLANT
BLADE SURG 15 STRL LF DISP TIS (BLADE) ×1 IMPLANT
BLADE SURG 15 STRL SS (BLADE) ×3
BNDG GAUZE ELAST 4 BULKY (GAUZE/BANDAGES/DRESSINGS) IMPLANT
CANISTER SUCTION 1200CC (MISCELLANEOUS) IMPLANT
CHLORAPREP W/TINT 26ML (MISCELLANEOUS) ×3 IMPLANT
CLOSURE WOUND 1/2 X4 (GAUZE/BANDAGES/DRESSINGS)
CLOTH BEACON ORANGE TIMEOUT ST (SAFETY) ×3 IMPLANT
COVER BACK TABLE 60X90IN (DRAPES) ×3 IMPLANT
COVER MAYO STAND STRL (DRAPES) ×3 IMPLANT
DRAPE LAPAROTOMY TRNSV 102X78 (DRAPE) IMPLANT
DRAPE PED LAPAROTOMY (DRAPES) ×2 IMPLANT
DRAPE UTILITY XL STRL (DRAPES) ×3 IMPLANT
ELECT REM PT RETURN 9FT ADLT (ELECTROSURGICAL) ×3
ELECTRODE REM PT RTRN 9FT ADLT (ELECTROSURGICAL) ×1 IMPLANT
GLOVE BIO SURGEON STRL SZ 6 (GLOVE) ×3 IMPLANT
GLOVE INDICATOR 6.5 STRL GRN (GLOVE) ×3 IMPLANT
GOWN STRL REUS W/TWL 2XL LVL3 (GOWN DISPOSABLE) ×3 IMPLANT
IV NS 500ML (IV SOLUTION) ×3
IV NS 500ML BAXH (IV SOLUTION) IMPLANT
LIQUID BAND (GAUZE/BANDAGES/DRESSINGS) ×3 IMPLANT
NDL HYPO 25X1 1.5 SAFETY (NEEDLE) ×1 IMPLANT
NEEDLE HYPO 22GX1.5 SAFETY (NEEDLE) IMPLANT
NEEDLE HYPO 25X1 1.5 SAFETY (NEEDLE) ×3 IMPLANT
NS IRRIG 500ML POUR BTL (IV SOLUTION) ×3 IMPLANT
PACK BASIN DAY SURGERY FS (CUSTOM PROCEDURE TRAY) ×3 IMPLANT
PENCIL BUTTON HOLSTER BLD 10FT (ELECTRODE) ×3 IMPLANT
SPONGE GAUZE 4X4 12PLY STER LF (GAUZE/BANDAGES/DRESSINGS) ×2 IMPLANT
SPONGE LAP 4X18 X RAY DECT (DISPOSABLE) IMPLANT
STRIP CLOSURE SKIN 1/2X4 (GAUZE/BANDAGES/DRESSINGS) IMPLANT
SUT MNCRL AB 4-0 PS2 18 (SUTURE) ×2 IMPLANT
SUT VIC AB 3-0 SH 27 (SUTURE) ×3
SUT VIC AB 3-0 SH 27X BRD (SUTURE) ×1 IMPLANT
SUT VICRYL 4-0 PS2 18IN ABS (SUTURE) ×3 IMPLANT
SYR CONTROL 10ML LL (SYRINGE) ×3 IMPLANT
TOWEL OR 17X24 6PK STRL BLUE (TOWEL DISPOSABLE) ×6 IMPLANT
TRAY DSU PREP LF (CUSTOM PROCEDURE TRAY) ×1 IMPLANT
TUBE CONNECTING 12'X1/4 (SUCTIONS)
TUBE CONNECTING 12X1/4 (SUCTIONS) IMPLANT
WATER STERILE IRR 500ML POUR (IV SOLUTION) ×1 IMPLANT
YANKAUER SUCT BULB TIP NO VENT (SUCTIONS) IMPLANT

## 2014-08-22 NOTE — Progress Notes (Signed)
Weekly Management Note:  Site: Left chest wall/mastectomy scar boost Current Dose:  800  cGy Projected Dose: 1000  cGy  Narrative: The patient is seen today for routine under treatment assessment. CBCT/MVCT images/port films were reviewed. The chart was reviewed.   She is without new complaints today.  She will have her Port-A-Cath removed later today.  Physical Examination: There were no vitals filed for this visit..  Weight:  .  There is erythema along the left upper chest wall Port-A-Cath with no significant change from earlier this week.  Impression: Tolerating radiation therapy well.  Plan: Continue radiation therapy as planned.  She will have her Port-A-Cath removed today and hopefully finish her radiation therapy tomorrow.  One-month follow-up visit.

## 2014-08-22 NOTE — H&P (Signed)
Rachel [name] is an 59 y.o. female.   Chief Complaint: infected port a cath HPI:  Pt is a 59 yo F s/p left mastectomy for left breast cancer.  She had a hematoma post op.  This resolved, but her port a cath became very red during radiation.  She started developing purulent drainage from a stick site.  She completed radiation, and now is having port removed.    Past Medical History  Diagnosis Date  . Hypertension   . Alopecia areata   . Status post chemotherapy 11/26/2013 - 03/11/2014    6 cycles of neoadjuvant chemotherapy with TCH-P (Taxotere, Carbolatin, Herceptin and Perjeta followed by Neulasta  . Hyperlipidemia   . Type 2 diabetes, diet controlled   . History of endometriosis   . Infection due to port-a-cath     left chest area w/ cellulitis and skin ulcer   . Breast cancer oncologist-  dr Krista Blue feng/  dx 07/ 2015--  DCIS left breast cT3, N0, Stage IIB  triple positive ,  ER+PR+HER2+/       s/p  chemotherapy 11-26-2013 to 03-11-2014 (after chemo, yT1cN1a)--  s/p  left mastectomy w/ SLN bx 05-08-2014/  radiation therapy currently started 07-10-2014  . Anemia due to chemotherapy   . 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 Colonoscopy    . Portacath placement N/A 11/20/2013    Procedure: INSERTION PORT-A-CATH;  Surgeon: [name], MD;  Location: [facility];  Service: General;  Laterality: N/A;  . Breast surgery Left 7/15    bx  . Mastectomy w/ sentinel node biopsy Left 05/08/2014    Procedure: LEFT MASTECTOMY WITH SENTINEL LYMPH NODE BIOPSY;  Surgeon: [name], MD;  Location: [facility];  Service: General;  Laterality: Left;  . Transthoracic echocardiogram  05-30-2014    grade I diastolic dysfunction/ ef 37-85%/  moderate diffuse calcification AV without stenosis/  trivial MR and TR  . Abdominal hysterectomy  1995    and 1996 Laparosopic Bilateral Salpingoophorectomy    Family History  Problem Relation Age of  Onset  . Diabetes Mother   . Coronary artery disease Mother   . Diabetes Father   . Diabetes Sister     gestational  . Diabetes Brother   . Diabetes Brother   . Diabetes Sister    Social History:  reports that she has never smoked. She has never used smokeless tobacco. She reports that she does not drink alcohol or use illicit drugs.  Allergies:  Allergies  Allergen Reactions  . Eggs Or Egg-Derived Products Nausea Only    "has never had a flu shot"  . Nickel Hives  . Sulfa Antibiotics Swelling  . Neosporin [Neomycin-Bacitracin Zn-Polymyx] Swelling    Swelling of area, redness   Contains sulfur  . Multihance [Gadobenate] Nausea And Vomiting    Contrast dye  . Penicillins Rash  . Tetanus Toxoids Swelling and Rash    Medications Prior to Admission  Medication Sig Dispense Refill  . calcium citrate-vitamin D (CITRACAL+D) 315-200 MG-UNIT per tablet Take 1 tablet by mouth daily.    Marland Kitchen emollient (BIAFINE) cream Apply 1 application topically 2 (two) times daily.    . hydrochlorothiazide (HYDRODIURIL) 25 MG tablet Take 25 mg by mouth every morning.     . hydrocortisone cream 1 % Apply 1 application topically 2 (two) times daily as needed for itching (rash).    Marland Kitchen levofloxacin (LEVAQUIN) 500 MG  tablet Take 1 tablet (500 mg total) by mouth daily. 7 tablet 0  . Multiple Vitamin (MULTIVITAMIN WITH MINERALS) TABS tablet Take 1 tablet by mouth daily.    . prochlorperazine (COMPAZINE) 10 MG tablet Take 10 mg by mouth every 6 (six) hours as needed for nausea or vomiting.    . Trastuzumab (HERCEPTIN IV) Inject 1 each into the vein See admin instructions. Every 3 weeks-unknown dose-last infusion 05/20/14    . acetaminophen (TYLENOL) 500 MG tablet Take 500 mg by mouth every 6 (six) hours as needed (pain).    Marland Kitchen lidocaine-prilocaine (EMLA) cream Apply one application to port-a-cath 1 - 2 hours prior to access. (Patient taking differently: Apply 1 application topically as needed (apply 1-2 hours prior to  Herceptin infusion (every 3 weeks)). ) 30 g 1    Results for orders placed or performed during the hospital encounter of 08/22/14 (from the past 48 hour(s))  I-STAT, chem 8     Status: Abnormal   Collection Time: 08/22/14  9:22 AM  Result Value Ref Range   Sodium 144 135 - 145 mmol/L   Potassium 3.8 3.5 - 5.1 mmol/L   Chloride 102 101 - 111 mmol/L   BUN 17 6 - 20 mg/dL   Creatinine, Ser 0.90 0.44 - 1.00 mg/dL   Glucose, Bld 101 (H) 65 - 99 mg/dL   Calcium, Ion 1.27 (H) 1.12 - 1.23 mmol/L   TCO2 26 0 - 100 mmol/L   Hemoglobin 12.6 12.0 - 15.0 g/dL   HCT 37.0 36.0 - 46.0 %   No results found.  Review of Systems  All other systems reviewed and are negative.   Blood pressure 116/67, pulse 80, temperature 97.7 F (36.5 C), temperature source Oral, resp. rate 20, height '5\' 5"'  (1.651 m), weight 68.947 kg (152 lb), SpO2 100 %. Physical Exam  Constitutional: She is oriented to person, place, and time. She appears well-developed and well-nourished. No distress.  HENT:  Head: Normocephalic and atraumatic.  Eyes: Conjunctivae are normal. Pupils are equal, round, and reactive to light. No scleral icterus.  Neck: Normal range of motion. No thyromegaly present.  Cardiovascular: Normal rate.   Respiratory: Effort normal. No respiratory distress.    Hyperpigmentation from radiation.  Some erythema around port site.  Small draining area below incision.    Musculoskeletal: Normal range of motion.  Neurological: She is alert and oriented to person, place, and time.  Skin: Skin is warm and dry. She is not diaphoretic. There is erythema (on left upper chest wall).  Psychiatric: She has a normal mood and affect. Her behavior is normal. Judgment and thought content normal.     Assessment/Plan Infected port a cath  Remove port. Risks are difficulty with wound and wound healing, bleeding.   Pt wishes to proceed.  [place] 08/22/2014, 10:07 AM

## 2014-08-22 NOTE — Transfer of Care (Signed)
Immediate Anesthesia Transfer of Care Note  Patient: Rachel [name]  Procedure(s) Performed: Procedure(s): REMOVAL PORT-A-CATH (N/A)  Patient Location: PACU  Anesthesia Type:MAC  Level of Consciousness: awake, alert , oriented and patient cooperative  Airway & Oxygen Therapy: Patient Spontanous Breathing and Patient connected to nasal cannula oxygen  Post-op Assessment: Report given to RN and Post -op Vital signs reviewed and stable  Post vital signs: Reviewed and stable  Last Vitals:  Filed Vitals:   08/22/14 0846  BP: 116/67  Pulse: 80  Temp: 36.5 C  Resp: 20    Complications: No apparent anesthesia complications

## 2014-08-22 NOTE — Discharge Instructions (Signed)

## 2014-08-22 NOTE — Anesthesia Postprocedure Evaluation (Signed)
  Anesthesia Post-op Note  Patient: Rachel [name]  Procedure(s) Performed: Procedure(s): REMOVAL PORT-A-CATH (Left)  Patient Location: PACU  Anesthesia Type:MAC  Level of Consciousness: awake and alert   Airway and Oxygen Therapy: Patient Spontanous Breathing  Post-op Pain: none  Post-op Assessment: Post-op Vital signs reviewed  Post-op Vital Signs: stable  Last Vitals:  Filed Vitals:   08/22/14 1115  BP: 120/69  Pulse: 81  Temp:   Resp: 25    Complications: No apparent anesthesia complications

## 2014-08-22 NOTE — Anesthesia Preprocedure Evaluation (Addendum)
Anesthesia Evaluation  Patient identified by MRN, date of birth, ID band Patient awake    Reviewed: Allergy & Precautions, NPO status , Patient's Chart, lab work & pertinent test results  Airway Mallampati: II  TM Distance: >3 FB Neck ROM: Full    Dental no notable dental hx. (+) Teeth Intact   Pulmonary neg pulmonary ROS,  breath sounds clear to auscultation  Pulmonary exam normal       Cardiovascular hypertension, Normal cardiovascular examRhythm:Regular Rate:Normal     Neuro/Psych negative neurological ROS  negative psych ROS   GI/Hepatic negative GI ROS, Neg liver ROS,   Endo/Other  diabetes  Renal/GU      Musculoskeletal   Abdominal   Peds  Hematology negative hematology ROS (+)   Anesthesia Other Findings   Reproductive/Obstetrics negative OB ROS                            Anesthesia Physical Anesthesia Plan  ASA: II  Anesthesia Plan: MAC   Post-op Pain Management:    Induction: Intravenous  Airway Management Planned: Natural Airway and Simple Face Mask  Additional Equipment:   Intra-op Plan:   Post-operative Plan:   Informed Consent: I have reviewed the patients History and Physical, chart, labs and discussed the procedure including the risks, benefits and alternatives for the proposed anesthesia with the patient or authorized representative who has indicated his/her understanding and acceptance.     Plan Discussed with: CRNA and Surgeon  Anesthesia Plan Comments:         Anesthesia Quick Evaluation

## 2014-08-22 NOTE — Op Note (Signed)
  PRE-OPERATIVE DIAGNOSIS:  Infected port a cath.   POST-OPERATIVE DIAGNOSIS:  Same   PROCEDURE:  Procedure(s):  REMOVAL PORT-A-CATH  SURGEON:  Surgeon(s):  [name], MD  ANESTHESIA:   MAC + local  EBL:   Minimal  SPECIMEN:  None  Complications : none known  Procedure:   Pt was  identified in the holding area and taken to the operating room where she was placed supine on the operating room table.  General anesthesia was induced via LMA.  The R upper chest was prepped and draped.  The prior incision was anesthetized with local anesthetic.  The incision was opened with a #15 blade.  The subcutaneous tissue was divided with the cautery.  The port was identified and the capsule opened.  The 4 2-0 prolene sutures were removed.  The port was then removed and pressure held on the tract.  The catheter appeared intact without evidence of breakage.  The wound was inspected for hemostasis, which was achieved with cautery.  The wound was closed with 3-0 vicryl deep dermal interrupted sutures and 4-0 Monocryl running subcuticular suture.  The wound was cleaned, dried, and dressed with dermabond.  The patient was awakened from anesthesia and taken to the PACU in stable condition.  Needle, sponge, and instrument counts are correct.

## 2014-08-22 NOTE — Anesthesia Procedure Notes (Signed)
Procedure Name: MAC Date/Time: 08/22/2014 10:35 AM Performed by: Wanita Chamberlain Pre-anesthesia Checklist: Patient identified, Timeout performed, Emergency Drugs available, Suction available and Patient being monitored Patient Re-evaluated:Patient Re-evaluated prior to inductionOxygen Delivery Method: Nasal cannula Preoxygenation: Pre-oxygenation with 100% oxygen Intubation Type: IV induction Placement Confirmation: positive ETCO2 and breath sounds checked- equal and bilateral Dental Injury: Teeth and Oropharynx as per pre-operative assessment

## 2014-08-23 ENCOUNTER — Ambulatory Visit
Admission: RE | Admit: 2014-08-23 | Discharge: 2014-08-23 | Disposition: A | Discharge status: Home / Self Care | Payer: Managed Care, Other (non HMO) | Source: Ambulatory Visit | Attending: Radiation Oncology | Admitting: Radiation Oncology

## 2014-08-23 DIAGNOSIS — C50412 Malignant neoplasm of upper-outer quadrant of left female breast: Secondary | ICD-10-CM

## 2014-08-23 DIAGNOSIS — Z510 Encounter for antineoplastic radiation therapy: Secondary | ICD-10-CM | POA: Diagnosis not present

## 2014-08-25 LAB — CATH TIP CULTURE

## 2014-08-26 ENCOUNTER — Encounter (HOSPITAL_BASED_OUTPATIENT_CLINIC_OR_DEPARTMENT_OTHER): Payer: Self-pay | Admitting: General Surgery

## 2014-08-26 ENCOUNTER — Encounter: Payer: Self-pay | Admitting: Radiation Oncology

## 2014-08-26 NOTE — Progress Notes (Signed)
[facility] End of Treatment Note  Name:Rachel [name]  Date: 08/26/2014 UEA:[id number] DOB:1955/09/27   Status:outpatient    CC: [name], MD  Dr. [name], Dr. Truitt Merle  REFERRING PHYSICIAN: Dr. 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  INDICATION FOR TREATMENT: Curative   TREATMENT DATES: 07/10/2014 through 08/23/2014                          SITE/DOSE: Left chest wall/regional lymph nodes 5040 cGy in 28 sessions, left chest wall boost 1000 cGy in 5 sessions                           BEAMS/ENERGY:  Tangential fields with deep inspiration breath-hold for treatment to her left chest wall.  1.0 cm bolus every other day to maximize the dose to the skin surface.  10 MV photons RAO to the left supraclavicular/axillary region, dose prescribed at 3 cm depth with supplemental PA left axillary field to bring the midaxillary dose up to 4600 cGy in 28 sessions.  6 MEV electrons, left mastectomy chest wall boost with 0.8 cm bolus to maximize the dose to the skin surface.                 NARRATIVE:   The patient tolerated her treatment well although she was felt to have a cellulitis as early as the first week of her radiation therapy along her left anterior chest wall Port-A-Cath.  She appeared to have developed a small abscess which was cultured and found to grow Pseudomonas.  She was treated appropriately with antibiotics through Dr. Burr Medico.  We waited until we completed her chest wall radiation before Dr. Barry Dienes removed her Port-A-Cath last week.  The patient understands that there will be some delay in her healing because of her recent radiation therapy.  Otherwise, she developed marked hyperpigmentation the skin with dry desquamation but no areas of moist desquamation.                         PLAN: Routine followup in one month. Patient instructed to  call if questions or worsening complaints in interim.

## 2014-08-27 LAB — ANAEROBIC CULTURE: Gram Stain: NONE SEEN

## 2014-09-03 ENCOUNTER — Telehealth: Payer: Self-pay | Admitting: Hematology

## 2014-09-03 ENCOUNTER — Ambulatory Visit (HOSPITAL_BASED_OUTPATIENT_CLINIC_OR_DEPARTMENT_OTHER): Payer: Managed Care, Other (non HMO) | Admitting: Hematology

## 2014-09-03 ENCOUNTER — Encounter: Payer: Self-pay | Admitting: Hematology

## 2014-09-03 ENCOUNTER — Other Ambulatory Visit (HOSPITAL_BASED_OUTPATIENT_CLINIC_OR_DEPARTMENT_OTHER): Payer: Managed Care, Other (non HMO)

## 2014-09-03 ENCOUNTER — Encounter: Payer: Self-pay | Admitting: Oncology

## 2014-09-03 ENCOUNTER — Ambulatory Visit: Payer: Managed Care, Other (non HMO)

## 2014-09-03 ENCOUNTER — Ambulatory Visit (HOSPITAL_BASED_OUTPATIENT_CLINIC_OR_DEPARTMENT_OTHER): Payer: Managed Care, Other (non HMO)

## 2014-09-03 VITALS — BP 126/71 | HR 98 | Temp 98.4°F | Resp 18 | Ht 65.0 in | Wt 155.3 lb

## 2014-09-03 DIAGNOSIS — L03319 Cellulitis of trunk, unspecified: Secondary | ICD-10-CM

## 2014-09-03 DIAGNOSIS — C50412 Malignant neoplasm of upper-outer quadrant of left female breast: Secondary | ICD-10-CM

## 2014-09-03 DIAGNOSIS — Z170 Estrogen receptor positive status [ER+]: Secondary | ICD-10-CM | POA: Diagnosis not present

## 2014-09-03 DIAGNOSIS — Z5112 Encounter for antineoplastic immunotherapy: Secondary | ICD-10-CM | POA: Diagnosis not present

## 2014-09-03 DIAGNOSIS — E119 Type 2 diabetes mellitus without complications: Secondary | ICD-10-CM | POA: Diagnosis not present

## 2014-09-03 DIAGNOSIS — D6481 Anemia due to antineoplastic chemotherapy: Secondary | ICD-10-CM

## 2014-09-03 DIAGNOSIS — I10 Essential (primary) hypertension: Secondary | ICD-10-CM

## 2014-09-03 LAB — CBC WITH DIFFERENTIAL/PLATELET
BASO%: 1 % (ref 0.0–2.0)
Basophils Absolute: 0 10*3/uL (ref 0.0–0.1)
EOS%: 1.2 % (ref 0.0–7.0)
Eosinophils Absolute: 0.1 10*3/uL (ref 0.0–0.5)
HEMATOCRIT: 33.3 % — AB (ref 34.8–46.6)
HEMOGLOBIN: 10.9 g/dL — AB (ref 11.6–15.9)
LYMPH%: 12.1 % — AB (ref 14.0–49.7)
MCH: 27.7 pg (ref 25.1–34.0)
MCHC: 32.8 g/dL (ref 31.5–36.0)
MCV: 84.6 fL (ref 79.5–101.0)
MONO#: 0.5 10*3/uL (ref 0.1–0.9)
MONO%: 9.2 % (ref 0.0–14.0)
NEUT%: 76.5 % (ref 38.4–76.8)
NEUTROS ABS: 3.9 10*3/uL (ref 1.5–6.5)
PLATELETS: 197 10*3/uL (ref 145–400)
RBC: 3.94 10*6/uL (ref 3.70–5.45)
RDW: 17.2 % — ABNORMAL HIGH (ref 11.2–14.5)
WBC: 5 10*3/uL (ref 3.9–10.3)
lymph#: 0.6 10*3/uL — ABNORMAL LOW (ref 0.9–3.3)

## 2014-09-03 LAB — COMPREHENSIVE METABOLIC PANEL (CC13)
ALT: 10 U/L (ref 0–55)
ANION GAP: 10 meq/L (ref 3–11)
AST: 16 U/L (ref 5–34)
Albumin: 3.4 g/dL — ABNORMAL LOW (ref 3.5–5.0)
Alkaline Phosphatase: 70 U/L (ref 40–150)
BILIRUBIN TOTAL: 0.55 mg/dL (ref 0.20–1.20)
BUN: 19.3 mg/dL (ref 7.0–26.0)
CO2: 28 meq/L (ref 22–29)
Calcium: 9.4 mg/dL (ref 8.4–10.4)
Chloride: 105 mEq/L (ref 98–109)
Creatinine: 0.9 mg/dL (ref 0.6–1.1)
EGFR: 85 mL/min/{1.73_m2} — AB (ref >90)
GLUCOSE: 104 mg/dL (ref 70–140)
Potassium: 3.8 mEq/L (ref 3.5–5.1)
Sodium: 143 mEq/L (ref 136–145)
Total Protein: 7 g/dL (ref 6.4–8.3)

## 2014-09-03 MED ORDER — ACETAMINOPHEN 325 MG PO TABS
650.0000 mg | ORAL_TABLET | Freq: Once | ORAL | Status: AC
  Administered 2014-09-03: 650 mg via ORAL

## 2014-09-03 MED ORDER — METHYLPREDNISOLONE SODIUM SUCC 40 MG IJ SOLR
INTRAMUSCULAR | Status: AC
  Filled 2014-09-03: qty 1

## 2014-09-03 MED ORDER — DIPHENHYDRAMINE HCL 25 MG PO CAPS
ORAL_CAPSULE | ORAL | Status: AC
  Filled 2014-09-03: qty 2

## 2014-09-03 MED ORDER — TRASTUZUMAB CHEMO INJECTION 440 MG
6.0000 mg/kg | Freq: Once | INTRAVENOUS | Status: AC
  Administered 2014-09-03: 420 mg via INTRAVENOUS
  Filled 2014-09-03: qty 20

## 2014-09-03 MED ORDER — DIPHENHYDRAMINE HCL 25 MG PO CAPS
50.0000 mg | ORAL_CAPSULE | Freq: Once | ORAL | Status: AC
  Administered 2014-09-03: 50 mg via ORAL

## 2014-09-03 MED ORDER — BIAFINE EX EMUL
Freq: Once | CUTANEOUS | Status: AC
  Administered 2014-09-03: 10:00:00 via TOPICAL

## 2014-09-03 MED ORDER — METHYLPREDNISOLONE SODIUM SUCC 40 MG IJ SOLR
40.0000 mg | Freq: Once | INTRAMUSCULAR | Status: AC
  Administered 2014-09-03: 40 mg via INTRAVENOUS

## 2014-09-03 MED ORDER — SODIUM CHLORIDE 0.9 % IV SOLN
Freq: Once | INTRAVENOUS | Status: AC
  Administered 2014-09-03: 11:00:00 via INTRAVENOUS

## 2014-09-03 MED ORDER — ACETAMINOPHEN 325 MG PO TABS
ORAL_TABLET | ORAL | Status: AC
  Filled 2014-09-03: qty 2

## 2014-09-03 NOTE — Patient Instructions (Signed)
[facility] Discharge Instructions for Patients Receiving Chemotherapy  Today you received the following chemotherapy agents: Herceptin   To help prevent nausea and vomiting after your treatment, we encourage you to take your nausea medication as directed.    If you develop nausea and vomiting that is not controlled by your nausea medication, call the clinic.   BELOW ARE SYMPTOMS THAT SHOULD BE REPORTED IMMEDIATELY:  *FEVER GREATER THAN 100.5 F  *CHILLS WITH OR WITHOUT FEVER  NAUSEA AND VOMITING THAT IS NOT CONTROLLED WITH YOUR NAUSEA MEDICATION  *UNUSUAL SHORTNESS OF BREATH  *UNUSUAL BRUISING OR BLEEDING  TENDERNESS IN MOUTH AND THROAT WITH OR WITHOUT PRESENCE OF ULCERS  *URINARY PROBLEMS  *BOWEL PROBLEMS  UNUSUAL RASH Items with * indicate a potential emergency and should be followed up as soon as possible.  Feel free to call the clinic you have any questions or concerns. The clinic phone number is [phone].  Please show the CHEMO ALERT CARD at check-in to the Emergency Department and triage nurse.   

## 2014-09-03 NOTE — Progress Notes (Addendum)
Rachel [name] stopped by the clinic for a refill of biafine.  The skin on her left chest is dry with hyperpigmentation.  She has a scabbed area and healing incision from her port a cath removal.  She will see Dr. Burr Medico today.  She was given another tube of biafine.

## 2014-09-03 NOTE — Telephone Encounter (Signed)
Gave avs & calendar for June, July, August

## 2014-09-03 NOTE — Progress Notes (Signed)
[facility] OFFICE PROGRESS NOTE  Patient Care Team: Seward Carol, MD as PCP - General (Internal Medicine) [name], MD as Consulting Physician (General Surgery)  SUMMARY OF ONCOLOGIC HISTORY: Oncology History   Breast cancer of upper-outer quadrant of left female breast   Staging form: Breast, AJCC 7th Edition     Clinical: Stage IIB (T3, N0, cM0) - Unsigned       Staging comments: Staged at breast conference 8.5.15      Pathologic stage from 05/08/2014: Stage IIA (yT1c(m), N1a, cM0) - Signed by Seward Grater, MD on 05/15/2014       Staging comments: Staged on final mastectomy specimen by Dr. Donato Heinz        Breast cancer of upper-outer quadrant of left female breast   05/08/2013 Surgery left mastectomy and SLN biopsy, negative margins. Path showed 2 lesions 0.5cm an d1.4cm, ympT1cpN1aMx   10/17/2013 Mammogram There is persistent distortion in the left upper outer quadrant, corresponding to the screening mammographic finding.    10/26/2013 Breast US Ultrasound is performed, showing a hypoechoic irregular mass with surrounding distortion in the left breast 2 o'clock location 4 cm from the nipple measuring 1.1 x 0.9 x 0.7 cm. This corresponds to the mammographic finding. 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 - CALCIFICATIONS IDENTIFIED   10/30/2013 Receptors her2 tumor molecular markers: ER 100%, POSITIVE, PR: 42%, POSITIVE, Ki67: 53%; HER2 (+) (ration 2.8, copy number 3.5)   11/05/2013 Breast MRI Irregular enhancing mass measuring 5.1 cm in the upper and upper-outer quadrants of the left breast. The MR enhancing mass is much larger than the mass seen mammographically or sonographically.    11/05/2013 Breast MRI Suspicious level 1 left axillary lymph node concerning for metastatic involving. But [place] negative, no node biopsy.    11/07/2013  Initial Diagnosis Breast cancer of upper-outer quadrant of left female breast   11/26/2013 - 03/11/2014 Chemotherapy 6 cycles of neoadjuvant chemotherapy with TCH-P (Taxotere, Carbolatin, Herceptin and Perjeta followed by Neulasta   04/08/2014 -  Chemotherapy Herceptin maintenance    05/08/2014 Surgery Left mastectomy, margins are negative. Surgical past showed multifocal invasive ductal carcinoma (0.5 cm, 1.4 cm), 2/2 SLN were positive.   07/10/2014 - 08/23/2014 Radiation Therapy adjuvant radiation to Left chest wall/regional lymph nodes 5040 cGy in 28 sessions, left chest wall boost 1000 cGy in 5 sessions     CURRENT THERAPY: Herceptin maintenance therapy started on 04/08/2014, every 3 weeks, plan to finish in mind Aug 2016   INTERVAL HISTORY: She returns for follow up and Herceptin treatment. She completed radiation on 5/20, and had port removed on 5/19. The tip of the port culture was positive for Pseudomonas. Her previous blood culture before the port removal was negative. She has been feeling well, denies any fever, chills, night sweats. The port removal site has healed well. She overall feels well, denies any pain, dyspnea, GI or other new symptoms.  REVIEW OF SYSTEMS:   Constitutional: Denies fevers, chills.  Eyes: Denies blurriness of vision Ears, nose, mouth, throat, and face: Denies mucositis or sore throat Respiratory: Denies cough, dyspnea or wheezes Cardiovascular: Denies palpitation, chest discomfort or lower extremity swelling Gastrointestinal:  Denies nausea, heartburn or change in bowel habits Skin: Denies abnormal skin rashes Lymphatics: Denies new lymphadenopathy or easy bruising Neurological:Denies numbness, tingling or new weaknesses Behavioral/Psych: Mood is stable, no new  changes  All other systems were reviewed with the patient and are negative except those in history   MEDICAL HISTORY:  Past Medical History  Diagnosis Date  . Hypertension   . Alopecia areata   .  Status post chemotherapy 11/26/2013 - 03/11/2014    6 cycles of neoadjuvant chemotherapy with TCH-P (Taxotere, Carbolatin, Herceptin and Perjeta followed by Neulasta  . Hyperlipidemia   . Type 2 diabetes, diet controlled   . History of endometriosis   . Infection due to port-a-cath     left chest area w/ cellulitis and skin ulcer   . Breast cancer oncologist-  dr Krista Blue Grissel Tyrell/  dx 07/ 2015--  DCIS left breast cT3, N0, Stage IIB  triple positive ,  ER+PR+HER2+/       s/p  chemotherapy 11-26-2013 to 03-11-2014 (after chemo, yT1cN1a)--  s/p  left mastectomy w/ SLN bx 05-08-2014/  radiation therapy currently started 07-10-2014  . Anemia due to chemotherapy   . 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 Colonoscopy    . Portacath placement N/A 11/20/2013    Procedure: INSERTION PORT-A-CATH;  Surgeon: [name], MD;  Location: [facility];  Service: General;  Laterality: N/A;  . Breast surgery Left 7/15    bx  . Mastectomy w/ sentinel node biopsy Left 05/08/2014    Procedure: LEFT MASTECTOMY WITH SENTINEL LYMPH NODE BIOPSY;  Surgeon: [name], MD;  Location: [facility];  Service: General;  Laterality: Left;  . Transthoracic echocardiogram  05-30-2014    grade I diastolic dysfunction/ ef 53-61%/  moderate diffuse calcification AV without stenosis/  trivial MR and TR  . Abdominal hysterectomy  1995    and 1996 Laparosopic Bilateral Salpingoophorectomy  . Port-a-cath removal Left 08/22/2014    Procedure: REMOVAL PORT-A-CATH;  Surgeon: [name], MD;  Location: [facility];  Service: General;  Laterality: Left;    I have reviewed the social history and family history with the patient and they are unchanged from previous note.  ALLERGIES:  is allergic to eggs or egg-derived products; nickel; sulfa antibiotics; neosporin; multihance; penicillins; and tetanus toxoids.  MEDICATIONS:    Medication List        This list is accurate as of: 09/03/14  8:22 AM.  Always use your most recent med list.               acetaminophen 500 MG tablet  Commonly known as:  TYLENOL  Take 500 mg by mouth every 6 (six) hours as needed (pain).     calcium citrate-vitamin D 315-200 MG-UNIT per tablet  Commonly known as:  CITRACAL+D  Take 1 tablet by mouth daily.     emollient cream  Commonly known as:  BIAFINE  Apply 1 application topically 2 (two) times daily.     HERCEPTIN IV  Inject 1 each into the vein See admin instructions. Every 3 weeks-unknown dose-last infusion 05/20/14     hydrochlorothiazide 25 MG tablet  Commonly known as:  HYDRODIURIL  Take 25 mg by mouth every morning.     hydrocortisone cream 1 %  Apply 1 application topically 2 (two) times daily as needed for itching (rash).     levofloxacin 500 MG tablet  Commonly known as:  LEVAQUIN  Take 1 tablet (500 mg total) by mouth daily.     lidocaine-prilocaine cream  Commonly known as:  EMLA  Apply one application to port-a-cath 1 -  2 hours prior to access.     multivitamin with minerals Tabs tablet  Take 1 tablet by mouth daily.     oxyCODONE-acetaminophen 5-325 MG per tablet  Commonly known as:  ROXICET  Take 1-2 tablets by mouth every 4 (four) hours as needed for severe pain.     prochlorperazine 10 MG tablet  Commonly known as:  COMPAZINE  Take 10 mg by mouth every 6 (six) hours as needed for nausea or vomiting.         PHYSICAL EXAMINATION: ECOG PERFORMANCE STATUS: 0 BP 126/71 mmHg  Pulse 98  Temp(Src) 98.4 F (36.9 C) (Oral)  Resp 18  Ht '5\' 5"'  (1.651 m)  Wt 155 lb 4.8 oz (70.444 kg)  BMI 25.84 kg/m2  SpO2 100% GENERAL:alert, no distress and comfortable SKIN: skin color, texture, turgor are normal, no rashes or significant lesions except (+) healing scar at the previous port site on left upper chest. (+) skin pigmentation at the left mastectomy site secondary to radiation. EYES: normal, Conjunctiva are pink and  non-injected, sclera clear OROPHARYNX:no exudate, no erythema and lips, buccal mucosa, and tongue normal  NECK: supple, thyroid normal size, non-tender, without nodularity LYMPH:  no palpable lymphadenopathy in the cervical, axillary or inguinal LUNGS: clear to auscultation and percussion with normal breathing effort HEART: regular rate & rhythm and no murmurs and no lower extremity edema ABDOMEN:abdomen soft, non-tender and normal bowel sounds Musculoskeletal:no cyanosis of digits and no clubbing  NEURO: alert & oriented x 3 with fluent speech, no focal motor/sensory deficits Breasts: Status post left mastectomy, surgical wound healing well, (+) skin pigmentation secondary to radiation. Palpation of the right breasts and axilla revealed no obvious mass that I could appreciate. EXTREMITIES: (+) Thickening of the nailbeds, no discharge from the left middle finger nail bed, no surrounding skin edema or redness.   LABORATORY DATA:  I have reviewed the data as listed CBC Latest Ref Rng 08/22/2014 08/12/2014 07/22/2014  WBC 3.9 - 10.3 10e3/uL - 5.0 4.0  Hemoglobin 12.0 - 15.0 g/dL 12.6 11.3(L) 10.2(L)  Hematocrit 36.0 - 46.0 % 37.0 34.7(L) 32.0(L)  Platelets 145 - 400 10e3/uL - 182 231     CMP Latest Ref Rng 08/22/2014 08/12/2014 07/22/2014  Glucose 65 - 99 mg/dL 101(H) 100 92  BUN 6 - 20 mg/dL 17 13.9 12.6  Creatinine 0.44 - 1.00 mg/dL 0.90 0.8 0.9  Sodium 135 - 145 mmol/L 144 142 145  Potassium 3.5 - 5.1 mmol/L 3.8 3.7 4.7  Chloride 101 - 111 mmol/L 102 - -  CO2 22 - 29 mEq/L - 28 27  Calcium 8.4 - 10.4 mg/dL - 10.2 9.9  Total Protein 6.4 - 8.3 g/dL - 7.7 7.2  Total Bilirubin 0.20 - 1.20 mg/dL - 0.58 0.20  Alkaline Phos 40 - 150 U/L - 87 74  AST 5 - 34 U/L - 12 14  ALT 0 - 55 U/L - 15 11   PATHOLOGY REPORT: Diagnosis 1. Breast, simple mastectomy, left 05/08/2014 - PENDING STAINS. - MULTIFOCAL INVASIVE DUCTAL CARCINOMA WITH NEOADJUVANT RELATED CHANGES, SEE COMMENT. - NEGATIVE FOR LYMPH  VASCULAR INVASION. - INVASIVE TUMOR IS 4 MM FROM NEAREST MARGIN (ANTERIOR). - DUCTAL CARCINOMA IN SITU - PREVIOUS BIOPSY SITE. - BENIGN SKIN; NEGATIVE FOR TUMOR - SEE TUMOR SYNOPTIC TEMPLATE BELOW. 2. Lymph node, sentinel, biopsy, left axillary #1 - ONE LYMPH NODE, POSITIVE FOR METASTATIC MAMMARY CARCINOMA (1/1). - INTRANODAL TUMOR DEPOSIT IS 2 MM - NEGATIVE FOR EXTRACAPSULAR TUMOR EXTENSION. 3. Lymph node, sentinel,  biopsy, left axillary #2 - ONE LYMPH NODE, POSITIVE FOR METASTATIC MAMMARY CARCINOMA (1/1). - INTRANODAL TUMOR DEPOSIT IS 4 MM - NEGATIVE FOR EXTRACAPSULAR TUMOR EXTENSION.  Microscopic Comment 1. BREAST, INVASIVE TUMOR, WITH LYMPH NODES PRESENT Specimen, including laterality and lymph node sampling (sentinel, non-sentinel): Left breast Procedure: Simple mastectomy Histologic type: Ductal (x3 tumors), see comment. Grade: II of III (Tumor #1 and 2), see comment Tubule formation: 2 Nuclear pleomorphism: 3 Mitotic: 2 Grade: I of III (Tumor #3), see comment Tubule formation: 1 Nuclear pleomorphism: 1 Mitotic: 1 1 of 4 FINAL for ADAMS-FOUST, Rilyn E ([id number]) Microscopic Comment(continued) Tumor size (glass slide measurement): 0.5 cm and 1.4 cm; gross measurement 2.0 cm, see comment. Margins: Invasive, distance to closest margin: 4 mm In-situ, distance to closest margin: 4 mm (anterior) If margin positive, focally or broadly: N/A Lymphovascular invasion: Absent Ductal carcinoma in situ: Present Grade: III of III Extensive intraductal component: Absent Lobular neoplasia: Pending stains Tumor focality: Multifocal Treatment effect: Present If present, treatment effect in breast tissue, lymph nodes or both: Tissue Extent of tumor: Skin: Negative Nipple: Negative Skeletal muscle: Negative Lymph nodes: Examined: 2 Sentinel 0 Non-sentinel 2 Total Lymph nodes with metastasis: 2 Isolated tumor cells (< 0.2 mm): 0 Micrometastasis: (> 0.2 mm and < 2.0 mm):  0 Macrometastasis: (> 2.0 mm): X 2 Extracapsular extension: Absent Breast prognostic profile: Estrogen receptor: Not repeated, previous study demonstrated 100% positivity and 100% positivity ([id number] and [id number]) Progesterone receptor: Not repeated, previous study demonstrated 67%% and 42% positivity ([id number] and [id number]) Her 2 neu: Not repeated, previous study demonstrated amplification ([id number] and [id number]) Ki-67: Not repeated, previous study demonstrated 12% and 53% proliferation rate ([id number] and [id number]) Non-neoplastic breast: Previous biopsy site; fibrocystic change, usual ductal hyperplasia, calcifications and neoadjuvant related tissue changes. TNM: ympT1c, pN1a, pMX Comments: There are three different mass areas identified. The first central mass associated with barbell shaped clip demonstrates both in situ and invasive ductal carcinoma. Within the focus, the invasive ductal carcinoma spans 5 mm. The second posterior medial mass demonstrates both in situ and invasive ductal carcinoma spanning the entire 2.0 cm grossly identified stellate area. The third and final lateral mass with associated heart shaped clip demonstrates both in situ and invasive ductal carcinoma. The invasive tumor spans 1.4cm and is involved by and away from previous biopsy site. The lack of the myoepithelial layer was confirmed with smooth muscle myosin heavy chain, Calponin, and p63 immunostains (slide 1K).. The presence of strong diffuse E-Cadherin expression and absence of cytokeratin 5/6 expression supports a ductal phenotype to the in situ carcinoma (slide 1K). (CR:kh 05-09-14) Mali RUND DO Pathologist, Electronic Signature (Case signed 05/13/2014) Specimen Gross and Clinical Information  RADIOGRAPHIC STUDIES: I have personally reviewed the radiological images as listed and agreed with the findings in the report.  ECHO 05/30/2014  - Left ventricle: Global  longitudinal LV strain was calculated at -13.5 but this appears inaccurate due to inaccurate tracking. The cavity size was normal. Systolic function was normal. The estimated ejection fraction was in the range of 55% to 60%. Wall motion was normal; there were no regional wall motion abnormalities. There was an increased relative contribution of atrial contraction to ventricular filling. Doppler parameters are consistent with abnormal left ventricular relaxation (grade 1 diastolic dysfunction). - Aortic valve: A bicuspid morphology cannot be excluded. Moderate diffuse thickening and calcification. - Tricuspid valve: There was trivial regurgitation.  BREAST MRI 04/03/2014  IMPRESSION: 1. There has been significant improvement in the non mass enhancement in  the upper-outer quadrant of the left breast. Persistent faint enhancement is identified now measuring up to 4.4 cm. One of the biopsy sites is within the area of enhancement. The second biopsy site is not associated with persistent enhancement on today's exam. 2. No suspicious lymph nodes identified on today's exam.   ASSESSMENT & PLAN:  This is a very pleasant 59 years-old African [place] female from [facility] who presents with a left Invasive Ductal Carcinoma. This was picked on a mammogram and maximal diameter was 1.7 cm on Ultrasound however the MRI breast showed this mass to be large almost 5.1 cm in maximal dimension making it a clinical T3 tumor. Also it showed a suspicious looking solitary lymph node in axilla but [place] was negative, not biopsied.  Clinical T3N0M0 Stage IIB.  The tumor biology indicates a LUMINAL-B type which means ER/PR/HER2 + tumor. Tumor is strongly ER+100%, PR 42%,KI-67 Index 53% showing a brisk mitotic rate and grade is called grade 2 (but grading as per pathologist cannot be relied upon on initial biopsy).   1. 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  -her restaging breast MRI results showed good partial response -I discussed her surgical pathology findings with her in details. Unfortunately she had metastasis to 2 Sentinel lymph nodes. Axillary lymph node dissection was discussed, Dr. Barry Dienes, Dr. Valere Dross and me and we recommend to proceed ALND, which is the current standard care. She however declined surgery due to the concern of lymphedema. She opted adjuvant radiation including the axillary area.  -I'll continue her Herceptin every 3 weeks to complete a total of one year treatment, through 11/2014 -Premedication with solumedro, tylenol and Benadryl before Herceptin due to the reaction  -She will start hormonal therapy since she has completed adjuvant radiation. She had a surgical menopause, I'll check her estrogen and LH level, to ensure her postmenopausal status. If it confirms, I recommend a aromatase inhibitor anastrozole. Potential side effects, which includes but not limited to, hot flash, skin and vaginal dryness, musculoskeletal discomfort, arthralgia, osteopenia or osteoporosis, hypercholesterolemia, cataracts, coronary artery disease, etc. were discussed in great details. She agrees to proceed. Burnis Medin monitor her heart function by repeating echo every 3 months. She is overdue, will schedule it asap  2. Left chest wall cellulitis and port infection with pseudomonas -Her port has been removed, the tip culture was positive for Pseudomonas, which was the same as her wound culture. -She has finished a course of antibiotics. Her blood culture before port removal was negative.  -We'll obtain an echo to rule out vegetation as soon as possible.  3. Anemia, secondary to chemotherapy -resolved now   4. Grade 1 peripheral neuropathy, and a mild intermittent ankle swollen, nail changes.  Likely secondary to chemotherapy  (docetaxel) -Stable. Continue observation     5. HTN, DM -Follow up with primary care physician  Plan -cont herceptin q3w, treatment today with premedication -RTC in 3 weeks for next cycle -ECHO asap    All questions were answered. The patient knows to call the clinic with any problems, questions or concerns. No barriers to learning was detected.  I spent 25 minutes counseling the patient face to face. The total time spent in the appointment was 30 minutes and more than 50% was on counseling and review of test results     Truitt Merle, MD 09/03/2014   8:22 AM

## 2014-09-04 ENCOUNTER — Telehealth: Payer: Self-pay | Admitting: Hematology

## 2014-09-04 LAB — LUTEINIZING HORMONE: LH: 38 m[IU]/mL

## 2014-09-04 LAB — FOLLICLE STIMULATING HORMONE: FSH: 62.3 m[IU]/mL

## 2014-09-04 LAB — ESTRADIOL: ESTRADIOL: 23 pg/mL

## 2014-09-04 NOTE — Telephone Encounter (Signed)
Confirmed appointment for ECHO. °

## 2014-09-05 ENCOUNTER — Encounter: Payer: Self-pay | Admitting: Hematology

## 2014-09-05 MED ORDER — ANASTROZOLE 1 MG PO TABS: 1.0000 mg | ORAL_TABLET | Freq: Every day | ORAL | Status: DC

## 2014-09-13 ENCOUNTER — Other Ambulatory Visit (HOSPITAL_COMMUNITY): Payer: Managed Care, Other (non HMO)

## 2014-09-13 ENCOUNTER — Ambulatory Visit (HOSPITAL_COMMUNITY)
Admission: RE | Admit: 2014-09-13 | Discharge: 2014-09-13 | Disposition: A | Discharge status: Home / Self Care | Payer: Managed Care, Other (non HMO) | Source: Ambulatory Visit | Attending: Hematology | Admitting: Hematology

## 2014-09-13 DIAGNOSIS — Z01818 Encounter for other preprocedural examination: Secondary | ICD-10-CM | POA: Diagnosis not present

## 2014-09-13 DIAGNOSIS — Z09 Encounter for follow-up examination after completed treatment for conditions other than malignant neoplasm: Secondary | ICD-10-CM

## 2014-09-13 DIAGNOSIS — C50412 Malignant neoplasm of upper-outer quadrant of left female breast: Secondary | ICD-10-CM | POA: Diagnosis not present

## 2014-09-13 NOTE — Progress Notes (Signed)
Echocardiogram 2D Echocardiogram has been performed.  Rachel [name] 09/13/2014, 2:25 PM

## 2014-09-16 ENCOUNTER — Ambulatory Visit (HOSPITAL_COMMUNITY): Payer: Managed Care, Other (non HMO)

## 2014-09-18 ENCOUNTER — Telehealth: Payer: Self-pay | Admitting: *Deleted

## 2014-09-18 NOTE — Telephone Encounter (Signed)
Patient called stating that she would like the results of her ECHO test. Message sent to MD [facility] and RN.

## 2014-09-19 NOTE — Telephone Encounter (Signed)
I called her back, and explained her echo result. She is tolerating arimidex well.   Truitt Merle  09/19/2014

## 2014-09-20 ENCOUNTER — Encounter: Payer: Self-pay | Admitting: Radiation Oncology

## 2014-09-23 ENCOUNTER — Ambulatory Visit: Payer: Managed Care, Other (non HMO)

## 2014-09-23 ENCOUNTER — Other Ambulatory Visit: Payer: Managed Care, Other (non HMO)

## 2014-09-25 ENCOUNTER — Encounter: Payer: Self-pay | Admitting: Hematology

## 2014-09-25 ENCOUNTER — Other Ambulatory Visit (HOSPITAL_BASED_OUTPATIENT_CLINIC_OR_DEPARTMENT_OTHER): Payer: Managed Care, Other (non HMO)

## 2014-09-25 ENCOUNTER — Ambulatory Visit: Payer: Managed Care, Other (non HMO)

## 2014-09-25 ENCOUNTER — Encounter: Payer: Self-pay | Admitting: Radiation Oncology

## 2014-09-25 ENCOUNTER — Ambulatory Visit (HOSPITAL_BASED_OUTPATIENT_CLINIC_OR_DEPARTMENT_OTHER): Payer: Managed Care, Other (non HMO) | Admitting: Hematology

## 2014-09-25 ENCOUNTER — Other Ambulatory Visit: Payer: Self-pay | Admitting: Nurse Practitioner

## 2014-09-25 ENCOUNTER — Ambulatory Visit (HOSPITAL_BASED_OUTPATIENT_CLINIC_OR_DEPARTMENT_OTHER): Payer: Managed Care, Other (non HMO)

## 2014-09-25 ENCOUNTER — Ambulatory Visit
Admission: RE | Admit: 2014-09-25 | Discharge: 2014-09-25 | Disposition: A | Discharge status: Home / Self Care | Payer: Managed Care, Other (non HMO) | Source: Ambulatory Visit | Attending: Radiation Oncology | Admitting: Radiation Oncology

## 2014-09-25 ENCOUNTER — Ambulatory Visit: Payer: Managed Care, Other (non HMO) | Admitting: Nurse Practitioner

## 2014-09-25 ENCOUNTER — Telehealth: Payer: Self-pay | Admitting: Hematology

## 2014-09-25 VITALS — BP 119/69 | HR 68 | Temp 98.2°F | Ht 65.0 in | Wt 155.5 lb

## 2014-09-25 VITALS — BP 131/67 | HR 65 | Temp 97.6°F | Resp 18 | Ht 65.0 in | Wt 155.2 lb

## 2014-09-25 DIAGNOSIS — C50412 Malignant neoplasm of upper-outer quadrant of left female breast: Secondary | ICD-10-CM

## 2014-09-25 DIAGNOSIS — D6481 Anemia due to antineoplastic chemotherapy: Secondary | ICD-10-CM | POA: Diagnosis not present

## 2014-09-25 DIAGNOSIS — Z923 Personal history of irradiation: Secondary | ICD-10-CM | POA: Diagnosis not present

## 2014-09-25 DIAGNOSIS — T827XXA Infection and inflammatory reaction due to other cardiac and vascular devices, implants and grafts, initial encounter: Secondary | ICD-10-CM | POA: Insufficient documentation

## 2014-09-25 DIAGNOSIS — B965 Pseudomonas (aeruginosa) (mallei) (pseudomallei) as the cause of diseases classified elsewhere: Secondary | ICD-10-CM | POA: Diagnosis not present

## 2014-09-25 DIAGNOSIS — L03313 Cellulitis of chest wall: Secondary | ICD-10-CM | POA: Diagnosis not present

## 2014-09-25 DIAGNOSIS — L539 Erythematous condition, unspecified: Secondary | ICD-10-CM | POA: Insufficient documentation

## 2014-09-25 DIAGNOSIS — T814XXA Infection following a procedure, initial encounter: Secondary | ICD-10-CM | POA: Diagnosis not present

## 2014-09-25 DIAGNOSIS — Z79899 Other long term (current) drug therapy: Secondary | ICD-10-CM | POA: Diagnosis not present

## 2014-09-25 DIAGNOSIS — I10 Essential (primary) hypertension: Secondary | ICD-10-CM

## 2014-09-25 DIAGNOSIS — Z5112 Encounter for antineoplastic immunotherapy: Secondary | ICD-10-CM | POA: Diagnosis not present

## 2014-09-25 DIAGNOSIS — E119 Type 2 diabetes mellitus without complications: Secondary | ICD-10-CM

## 2014-09-25 DIAGNOSIS — L02212 Cutaneous abscess of back [any part, except buttock]: Secondary | ICD-10-CM | POA: Diagnosis not present

## 2014-09-25 DIAGNOSIS — Z853 Personal history of malignant neoplasm of breast: Secondary | ICD-10-CM | POA: Insufficient documentation

## 2014-09-25 DIAGNOSIS — T80219D Unspecified infection due to central venous catheter, subsequent encounter: Secondary | ICD-10-CM

## 2014-09-25 HISTORY — DX: Personal history of irradiation: Z92.3

## 2014-09-25 LAB — CBC WITH DIFFERENTIAL/PLATELET
BASO%: 1 % (ref 0.0–2.0)
Basophils Absolute: 0 10*3/uL (ref 0.0–0.1)
EOS%: 2.8 % (ref 0.0–7.0)
Eosinophils Absolute: 0.1 10*3/uL (ref 0.0–0.5)
HCT: 33.4 % — ABNORMAL LOW (ref 34.8–46.6)
HGB: 10.8 g/dL — ABNORMAL LOW (ref 11.6–15.9)
LYMPH%: 21.9 % (ref 14.0–49.7)
MCH: 28 pg (ref 25.1–34.0)
MCHC: 32.5 g/dL (ref 31.5–36.0)
MCV: 86.4 fL (ref 79.5–101.0)
MONO#: 0.4 10*3/uL (ref 0.1–0.9)
MONO%: 11.1 % (ref 0.0–14.0)
NEUT#: 2.1 10*3/uL (ref 1.5–6.5)
NEUT%: 63.2 % (ref 38.4–76.8)
PLATELETS: 171 10*3/uL (ref 145–400)
RBC: 3.87 10*6/uL (ref 3.70–5.45)
RDW: 18.5 % — AB (ref 11.2–14.5)
WBC: 3.4 10*3/uL — ABNORMAL LOW (ref 3.9–10.3)
lymph#: 0.7 10*3/uL — ABNORMAL LOW (ref 0.9–3.3)

## 2014-09-25 LAB — COMPREHENSIVE METABOLIC PANEL (CC13)
ALBUMIN: 3.6 g/dL (ref 3.5–5.0)
ALK PHOS: 68 U/L (ref 40–150)
ALT: 13 U/L (ref 0–55)
AST: 17 U/L (ref 5–34)
Anion Gap: 6 mEq/L (ref 3–11)
BILIRUBIN TOTAL: 0.98 mg/dL (ref 0.20–1.20)
BUN: 17.9 mg/dL (ref 7.0–26.0)
CHLORIDE: 106 meq/L (ref 98–109)
CO2: 32 mEq/L — ABNORMAL HIGH (ref 22–29)
Calcium: 9.5 mg/dL (ref 8.4–10.4)
Creatinine: 0.9 mg/dL (ref 0.6–1.1)
EGFR: 78 mL/min/{1.73_m2} — AB (ref >90)
GLUCOSE: 90 mg/dL (ref 70–140)
POTASSIUM: 4.3 meq/L (ref 3.5–5.1)
SODIUM: 144 meq/L (ref 136–145)
TOTAL PROTEIN: 6.6 g/dL (ref 6.4–8.3)

## 2014-09-25 MED ORDER — TRASTUZUMAB CHEMO INJECTION 440 MG
6.0000 mg/kg | Freq: Once | INTRAVENOUS | Status: AC
  Administered 2014-09-25: 420 mg via INTRAVENOUS
  Filled 2014-09-25: qty 20

## 2014-09-25 MED ORDER — DIPHENHYDRAMINE HCL 25 MG PO CAPS
50.0000 mg | ORAL_CAPSULE | Freq: Once | ORAL | Status: AC
  Administered 2014-09-25: 50 mg via ORAL

## 2014-09-25 MED ORDER — METHYLPREDNISOLONE SODIUM SUCC 40 MG IJ SOLR
40.0000 mg | Freq: Once | INTRAMUSCULAR | Status: AC
  Administered 2014-09-25: 40 mg via INTRAVENOUS

## 2014-09-25 MED ORDER — ACETAMINOPHEN 325 MG PO TABS
ORAL_TABLET | ORAL | Status: AC
  Filled 2014-09-25: qty 2

## 2014-09-25 MED ORDER — METHYLPREDNISOLONE SODIUM SUCC 40 MG IJ SOLR
INTRAMUSCULAR | Status: AC
  Filled 2014-09-25: qty 1

## 2014-09-25 MED ORDER — DIPHENHYDRAMINE HCL 25 MG PO CAPS
ORAL_CAPSULE | ORAL | Status: AC
  Filled 2014-09-25: qty 1

## 2014-09-25 MED ORDER — SODIUM CHLORIDE 0.9 % IV SOLN
Freq: Once | INTRAVENOUS | Status: AC
  Administered 2014-09-25: 13:00:00 via INTRAVENOUS

## 2014-09-25 MED ORDER — DIPHENHYDRAMINE HCL 25 MG PO CAPS
ORAL_CAPSULE | ORAL | Status: AC
  Filled 2014-09-25: qty 2

## 2014-09-25 MED ORDER — ACETAMINOPHEN 325 MG PO TABS
650.0000 mg | ORAL_TABLET | Freq: Once | ORAL | Status: AC
  Administered 2014-09-25: 650 mg via ORAL

## 2014-09-25 NOTE — Progress Notes (Signed)
R[name] OFFICE PROGRESS NOTE  Patient Care Team: Seward Carol, MD as PCP - General (Internal Medicine) [name], MD as Consulting Physician (General Surgery)  SUMMARY OF ONCOLOGIC HISTORY: Oncology History   Breast cancer of upper-outer quadrant of left female breast   Staging form: Breast, AJCC 7th Edition     Clinical: Stage IIB (T3, N0, cM0) - Unsigned       Staging comments: Staged at breast conference 8.5.15      Pathologic stage from 05/08/2014: Stage IIA (yT1c(m), N1a, cM0) - Signed by Seward Grater, MD on 05/15/2014       Staging comments: Staged on final mastectomy specimen by Dr. Donato Heinz        Breast cancer of upper-outer quadrant of left female breast   05/08/2013 Surgery left mastectomy and SLN biopsy, negative margins. Path showed 2 lesions 0.5cm an d1.4cm, ympT1cpN1aMx   10/17/2013 Mammogram There is persistent distortion in the left upper outer quadrant, corresponding to the screening mammographic finding.    10/26/2013 Breast US Ultrasound is performed, showing a hypoechoic irregular mass with surrounding distortion in the left breast 2 o'clock location 4 cm from the nipple measuring 1.1 x 0.9 x 0.7 cm. This corresponds to the mammographic finding. 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 - CALCIFICATIONS IDENTIFIED   10/30/2013 Receptors her2 tumor molecular markers: ER 100%, POSITIVE, PR: 42%, POSITIVE, Ki67: 53%; HER2 (+) (ration 2.8, copy number 3.5)   11/05/2013 Breast MRI Irregular enhancing mass measuring 5.1 cm in the upper and upper-outer quadrants of the left breast. The MR enhancing mass is much larger than the mass seen mammographically or sonographically.    11/05/2013 Breast MRI Suspicious level 1 left axillary lymph node concerning for metastatic involving. But [place] negative, no node biopsy.    11/07/2013  Initial Diagnosis Breast cancer of upper-outer quadrant of left female breast   11/26/2013 - 03/11/2014 Chemotherapy 6 cycles of neoadjuvant chemotherapy with TCH-P (Taxotere, Carbolatin, Herceptin and Perjeta followed by Neulasta   04/08/2014 -  Chemotherapy Herceptin maintenance    05/08/2014 Surgery Left mastectomy, margins are negative. Surgical past showed multifocal invasive ductal carcinoma (0.5 cm, 1.4 cm), 2/2 SLN were positive.   07/10/2014 - 08/23/2014 Radiation Therapy adjuvant radiation to Left chest wall/regional lymph nodes 5040 cGy in 28 sessions, left chest wall boost 1000 cGy in 5 sessions   08/22/2014 Procedure port removal. Tip culture (+) Pseudomonas.      CURRENT THERAPY: Herceptin maintenance therapy started on 04/08/2014, every 3 weeks, plan to finish in mid Aug 2016   INTERVAL HISTORY: Rachel [name] returns for follow up and Herceptin treatment. She is doing well overall. She noticed a small blister at the old port site about 3 days ago, no skin erythema, no fever or chills. No other skin lesions. She has good energy level and appetite, weight is stable.  REVIEW OF SYSTEMS:   Constitutional: Denies fevers, chills.  Eyes: Denies blurriness of vision Ears, nose, mouth, throat, and face: Denies mucositis or sore throat Respiratory: Denies cough, dyspnea or wheezes Cardiovascular: Denies palpitation, chest discomfort or lower extremity swelling Gastrointestinal:  Denies nausea, heartburn or change in bowel habits Skin: Denies abnormal skin rashes except a blister at the old port site Lymphatics: Denies new lymphadenopathy or easy bruising Neurological:Denies numbness, tingling or new weaknesses Behavioral/Psych: Mood is stable, no new changes  All  other systems were reviewed with the patient and are negative except those in history   MEDICAL HISTORY:  Past Medical History  Diagnosis Date  . Hypertension   . Alopecia areata   . Status post chemotherapy 11/26/2013 - 03/11/2014     6 cycles of neoadjuvant chemotherapy with TCH-P (Taxotere, Carbolatin, Herceptin and Perjeta followed by Neulasta  . Hyperlipidemia   . Type 2 diabetes, diet controlled   . History of endometriosis   . Infection due to port-a-cath     left chest area w/ cellulitis and skin ulcer   . Breast cancer oncologist-  dr Krista Blue Faustino Luecke/  dx 07/ 2015--  DCIS left breast cT3, N0, Stage IIB  triple positive ,  ER+PR+HER2+/       s/p  chemotherapy 11-26-2013 to 03-11-2014 (after chemo, yT1cN1a)--  s/p  left mastectomy w/ SLN bx 05-08-2014/  radiation therapy currently started 07-10-2014  . Anemia due to chemotherapy   . 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 S/P radiation therapy 07/10/2014 through 08/23/2014     Left chest wall/regional lymph nodes 5040 cGy in 28 sessions, left chest wall boost 1000 cGy in 5 sessions     SURGICAL HISTORY: Past Surgical History  Procedure Laterality Date  . Colonoscopy    . Portacath placement N/A 11/20/2013    Procedure: INSERTION PORT-A-CATH;  Surgeon: [name], MD;  Location: [place];  Service: General;  Laterality: N/A;  . Breast surgery Left 7/15    bx  . Mastectomy w/ sentinel node biopsy Left 05/08/2014    Procedure: LEFT MASTECTOMY WITH SENTINEL LYMPH NODE BIOPSY;  Surgeon: [name], MD;  Location: [place];  Service: General;  Laterality: Left;  . Transthoracic echocardiogram  05-30-2014    grade I diastolic dysfunction/ ef 10-07%/  moderate diffuse calcification AV without stenosis/  trivial MR and TR  . Abdominal hysterectomy  1995    and 1996 Laparosopic Bilateral Salpingoophorectomy  . Port-a-cath removal Left 08/22/2014    Procedure: REMOVAL PORT-A-CATH;  Surgeon: [name], MD;  Location: [facility];  Service: General;  Laterality: Left;    I have reviewed the social history and family history with the patient and they are unchanged from previous  note.  ALLERGIES:  is allergic to eggs or egg-derived products; nickel; sulfa antibiotics; neosporin; multihance; penicillins; and tetanus toxoids.  MEDICATIONS:    Medication List       This list is accurate as of: 09/25/14 11:53 AM.  Always use your most recent med list.               acetaminophen 500 MG tablet  Commonly known as:  TYLENOL  Take 500 mg by mouth every 6 (six) hours as needed (pain).     anastrozole 1 MG tablet  Commonly known as:  ARIMIDEX  Take 1 tablet (1 mg total) by mouth daily.     calcium citrate-vitamin D 315-200 MG-UNIT per tablet  Commonly known as:  CITRACAL+D  Take 1 tablet by mouth daily.     emollient cream  Commonly known as:  BIAFINE  Apply 1 application topically 2 (two) times daily.     HERCEPTIN IV  Inject 1 each into the vein See admin instructions. Every 3 weeks-unknown dose-last infusion 05/20/14     hydrochlorothiazide 25 MG tablet  Commonly known as:  HYDRODIURIL  Take 25 mg by mouth every morning.     hydrocortisone cream 1 %  Apply 1 application topically  2 (two) times daily as needed for itching (rash).     multivitamin with minerals Tabs tablet  Take 1 tablet by mouth daily.     prochlorperazine 10 MG tablet  Commonly known as:  COMPAZINE  Take 10 mg by mouth every 6 (six) hours as needed for nausea or vomiting.         PHYSICAL EXAMINATION: ECOG PERFORMANCE STATUS: 0 BP 131/67 mmHg  Pulse 65  Temp(Src) 97.6 F (36.4 C) (Oral)  Resp 18  Ht '5\' 5"'  (1.651 m)  Wt 155 lb 3.2 oz (70.398 kg)  BMI 25.83 kg/m2  SpO2 100% GENERAL:alert, no distress and comfortable SKIN: skin color, texture, turgor are normal, no rashes or significant lesions except (+) small confluent folliculitis/abscess at the previous port site on left upper chest. (+) skin pigmentation at the left mastectomy site secondary to radiation. EYES: normal, Conjunctiva are pink and non-injected, sclera clear OROPHARYNX:no exudate, no erythema and lips,  buccal mucosa, and tongue normal  NECK: supple, thyroid normal size, non-tender, without nodularity LYMPH:  no palpable lymphadenopathy in the cervical, axillary or inguinal LUNGS: clear to auscultation and percussion with normal breathing effort HEART: regular rate & rhythm and no murmurs and no lower extremity edema ABDOMEN:abdomen soft, non-tender and normal bowel sounds Musculoskeletal:no cyanosis of digits and no clubbing  NEURO: alert & oriented x 3 with fluent speech, no focal motor/sensory deficits Breasts: Status post left mastectomy, surgical wound healing well, (+) skin pigmentation secondary to radiation. Palpation of the right breasts and axilla revealed no obvious mass that I could appreciate. EXTREMITIES: (+) Thickening of the nailbeds, no discharge from the left middle finger nail bed, no surrounding skin edema or redness.   LABORATORY DATA:  I have reviewed the data as listed CBC Latest Ref Rng 09/25/2014 09/03/2014 08/22/2014  WBC 3.9 - 10.3 10e3/uL 3.4(L) 5.0 -  Hemoglobin 11.6 - 15.9 g/dL 10.8(L) 10.9(L) 12.6  Hematocrit 34.8 - 46.6 % 33.4(L) 33.3(L) 37.0  Platelets 145 - 400 10e3/uL 171 197 -     CMP Latest Ref Rng 09/25/2014 09/03/2014 08/22/2014  Glucose 70 - 140 mg/dl 90 104 101(H)  BUN 7.0 - 26.0 mg/dL 17.9 19.3 17  Creatinine 0.6 - 1.1 mg/dL 0.9 0.9 0.90  Sodium 136 - 145 mEq/L 144 143 144  Potassium 3.5 - 5.1 mEq/L 4.3 3.8 3.8  Chloride 101 - 111 mmol/L - - 102  CO2 22 - 29 mEq/L 32(H) 28 -  Calcium 8.4 - 10.4 mg/dL 9.5 9.4 -  Total Protein 6.4 - 8.3 g/dL 6.6 7.0 -  Total Bilirubin 0.20 - 1.20 mg/dL 0.98 0.55 -  Alkaline Phos 40 - 150 U/L 68 70 -  AST 5 - 34 U/L 17 16 -  ALT 0 - 55 U/L 13 10 -   PATHOLOGY REPORT: Diagnosis 1. Breast, simple mastectomy, left 05/08/2014 - PENDING STAINS. - MULTIFOCAL INVASIVE DUCTAL CARCINOMA WITH NEOADJUVANT RELATED CHANGES, SEE COMMENT. - NEGATIVE FOR LYMPH VASCULAR INVASION. - INVASIVE TUMOR IS 4 MM FROM NEAREST MARGIN  (ANTERIOR). - DUCTAL CARCINOMA IN SITU - PREVIOUS BIOPSY SITE. - BENIGN SKIN; NEGATIVE FOR TUMOR - SEE TUMOR SYNOPTIC TEMPLATE BELOW. 2. Lymph node, sentinel, biopsy, left axillary #1 - ONE LYMPH NODE, POSITIVE FOR METASTATIC MAMMARY CARCINOMA (1/1). - INTRANODAL TUMOR DEPOSIT IS 2 MM - NEGATIVE FOR EXTRACAPSULAR TUMOR EXTENSION. 3. Lymph node, sentinel, biopsy, left axillary #2 - ONE LYMPH NODE, POSITIVE FOR METASTATIC MAMMARY CARCINOMA (1/1). - INTRANODAL TUMOR DEPOSIT IS 4 MM - NEGATIVE FOR EXTRACAPSULAR  TUMOR EXTENSION.  Microscopic Comment 1. BREAST, INVASIVE TUMOR, WITH LYMPH NODES PRESENT Specimen, including laterality and lymph node sampling (sentinel, non-sentinel): Left breast Procedure: Simple mastectomy Histologic type: Ductal (x3 tumors), see comment. Grade: II of III (Tumor #1 and 2), see comment Tubule formation: 2 Nuclear pleomorphism: 3 Mitotic: 2 Grade: I of III (Tumor #3), see comment Tubule formation: 1 Nuclear pleomorphism: 1 Mitotic: 1 1 of 4 FINAL for ADAMS-FOUST, Rachel [name] ([id number]) Microscopic Comment(continued) Tumor size (glass slide measurement): 0.5 cm and 1.4 cm; gross measurement 2.0 cm, see comment. Margins: Invasive, distance to closest margin: 4 mm In-situ, distance to closest margin: 4 mm (anterior) If margin positive, focally or broadly: N/A Lymphovascular invasion: Absent Ductal carcinoma in situ: Present Grade: III of III Extensive intraductal component: Absent Lobular neoplasia: Pending stains Tumor focality: Multifocal Treatment effect: Present If present, treatment effect in breast tissue, lymph nodes or both: Tissue Extent of tumor: Skin: Negative Nipple: Negative Skeletal muscle: Negative Lymph nodes: Examined: 2 Sentinel 0 Non-sentinel 2 Total Lymph nodes with metastasis: 2 Isolated tumor cells (< 0.2 mm): 0 Micrometastasis: (> 0.2 mm and < 2.0 mm): 0 Macrometastasis: (> 2.0 mm): X 2 Extracapsular extension:  Absent Breast prognostic profile: Estrogen receptor: Not repeated, previous study demonstrated 100% positivity and 100% positivity ([id number] and [id number]) Progesterone receptor: Not repeated, previous study demonstrated 67%% and 42% positivity ([id number] and [id number]) Her 2 neu: Not repeated, previous study demonstrated amplification ([id number] and [id number]) Ki-67: Not repeated, previous study demonstrated 12% and 53% proliferation rate ([id number] and [id number]) Non-neoplastic breast: Previous biopsy site; fibrocystic change, usual ductal hyperplasia, calcifications and neoadjuvant related tissue changes. TNM: ympT1c, pN1a, pMX Comments: There are three different mass areas identified. The first central mass associated with barbell shaped clip demonstrates both in situ and invasive ductal carcinoma. Within the focus, the invasive ductal carcinoma spans 5 mm. The second posterior medial mass demonstrates both in situ and invasive ductal carcinoma spanning the entire 2.0 cm grossly identified stellate area. The third and final lateral mass with associated heart shaped clip demonstrates both in situ and invasive ductal carcinoma. The invasive tumor spans 1.4cm and is involved by and away from previous biopsy site. The lack of the myoepithelial layer was confirmed with smooth muscle myosin heavy chain, Calponin, and p63 immunostains (slide 1K).. The presence of strong diffuse [name]-Cadherin expression and absence of cytokeratin 5/6 expression supports a ductal phenotype to the in situ carcinoma (slide 1K). (CR:kh 05-09-14) [place]i RUND DO Pathologist, Electronic Signature (Case signed 05/13/2014) Specimen Gross and Clinical Information  RADIOGRAPHIC STUDIES: I have personally reviewed the radiological images as listed and agreed with the findings in the report.  ECHO 05/30/2014  - Left ventricle: Global longitudinal LV strain was calculated at -13.5 but this appears  inaccurate due to inaccurate tracking. The cavity size was normal. Systolic function was normal. The estimated ejection fraction was in the range of 55% to 60%. Wall motion was normal; there were no regional wall motion abnormalities. There was an increased relative contribution of atrial contraction to ventricular filling. Doppler parameters are consistent with abnormal left ventricular relaxation (grade 1 diastolic dysfunction). - Aortic valve: A bicuspid morphology cannot be excluded. Moderate diffuse thickening and calcification. - Tricuspid valve: There was trivial regurgitation.  BREAST MRI 04/03/2014  IMPRESSION: 1. There has been significant improvement in the non mass enhancement in the upper-outer quadrant of the left breast. Persistent faint enhancement is identified now measuring up to 4.4 cm. One of the biopsy sites is within  the area of enhancement. The second biopsy site is not associated with persistent enhancement on today's exam. 2. No suspicious lymph nodes identified on today's exam.   ASSESSMENT & PLAN:  This is a very pleasant 59 years-old African [place] female from [facility] who presents with a left Invasive Ductal Carcinoma. This was picked on a mammogram and maximal diameter was 1.7 cm on Ultrasound however the MRI breast showed this mass to be large almost 5.1 cm in maximal dimension making it a clinical T3 tumor. Also it showed a suspicious looking solitary lymph node in axilla but [place] was negative, not biopsied.  Clinical T3N0M0 Stage IIB.  The tumor biology indicates a LUMINAL-B type which means ER/PR/HER2 + tumor. Tumor is strongly ER+100%, PR 42%,KI-67 Index 53% showing a brisk mitotic rate and grade is called grade 2 (but grading as per pathologist cannot be relied upon on initial biopsy).   1. 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  -her restaging breast MRI results showed good partial response -I discussed her surgical pathology findings with her in details. Unfortunately she had metastasis to 2 Sentinel lymph nodes. Axillary lymph node dissection was discussed, Dr. Barry Dienes, Dr. Valere Dross and me and we recommend to proceed ALND, which is the current standard care. She however declined surgery due to the concern of lymphedema. She opted adjuvant radiation including the axillary area.  -I'll continue her Herceptin every 3 weeks to complete a total of one year treatment, through 11/2014 -Premedication with solumedro, tylenol and Benadryl before Herceptin due to the reaction  -She is tolerating anastrozole well, we'll continue.  2. Left chest wall cellulitis and port infection with pseudomonas -Her port has been removed, the tip culture was positive for Pseudomonas, which was the same as her wound culture. -She has finished a course of antibiotics. Her blood culture before port removal was negative.  -recent echo was negative for vegetation. -Giving the possible small abscess at the same spot, I'll ask her I will ask our NP Ms Berniece Salines to drain it and sent for culture. We'll also repeat blood culture. -I have informed Dr. Barry Dienes. If her wound culture came back positive, she may need surgical debridement.  3. Anemia, secondary to chemotherapy -resolved now   4. Grade 1 peripheral neuropathy, and a mild intermittent ankle swollen, nail changes.  Likely secondary to chemotherapy (docetaxel) -Stable. Continue observation     5. HTN, DM -Follow up with primary care physician  Plan -cont herceptin q3w, treatment today with premedication -RTC in 3 weeks for next cycle -left chest wall small abscess drainage and culture, blood culture    All questions were answered. The patient knows to call the clinic with any problems, questions or concerns. No barriers to learning was  detected.  I spent 25 minutes counseling the patient face to face. The total time spent in the appointment was 30 minutes and more than 50% was on counseling and review of test results     Truitt Merle, MD 09/25/2014   11:53 AM

## 2014-09-25 NOTE — Telephone Encounter (Signed)
Gave avs & calendar for July/August. °

## 2014-09-25 NOTE — Progress Notes (Addendum)
Ms Adams-Foust here fro reassessment s/p xrt to her left chest wall.  Not marked hyperpigmentation in the fild.  Also note a non-healing area at the tip of her port-a-cath site which has occurred since surgery on Aug 22, 2014 due to a staph infection at the site. Has been on 4 rounds of antibiotics.  Dr. Barry Dienes aware of the above presentation.  Denies any fatigue.

## 2014-09-25 NOTE — Progress Notes (Signed)
CC: Dr. [name], Dr. Seward Carol, Dr. Truitt Merle  Follow-up note:  Rachel [name] returns today approximately 5 weeks following completion of radiation therapy to her left chest wall and regional lymph nodes in the management of her clinical stage T3 N0, pathologic stage TI N1 invasive ductal carcinoma/DCIS of the left breast.  She is generally doing well, however this past Sunday evening she developed erythema along her Port-A-Cath wound site.  She has not had any drainage.  She saw Dr. Barry Dienes on 09/13/2014 and she had complete wound healing.  She is been on adjuvant anastrozole for the past 2-3 weeks.  She is to see Dr. Burr Medico today as well.  She denies fevers/chills.  Physical examination: Alert and oriented. Filed Vitals:   09/25/14 1026  BP: 119/69  Pulse: 68  Temp: 98.2 F (36.8 C)   Nodes: There is no palpable cervical, supraclavicular, or axillary lymphadenopathy.  Chest: Along her left upper Port-A-Cath site is raised induration measuring 2.5 x 2.5 cm.  Her surgical wound is healed.  The area of induration is not fluctuant.  There are 2 small areas of whitish discoloration but I cannot express any drainage.  There is erythema surrounding the site.  There is no visible or palpable evidence for recurrent disease along her chest wall.  Extremities: Without edema.        Impression: Satisfactory progress, but I'm concerned that she may have a small abscess.  I'm pleased to see that she has not had any wound dehiscence and that her wound appears to be healing well.  Photographs were taken for Dr. Barry Dienes to see.  I think she is to see Dr. Barry Dienes for further evaluation.  Plan: As above.  Follow-up visit with me in 2 months.

## 2014-09-26 ENCOUNTER — Encounter: Payer: Self-pay | Admitting: Nurse Practitioner

## 2014-09-26 NOTE — Progress Notes (Signed)
Left chest wall at previously removed Port-A-Cath.  Surgical site with mild edema and erythema; but, no warmth.  It appears that there is a small approximately 0.5 cm area of abscess.  Lidocaine 2%, approximately 3 ML's administered at site prior to abscess drainage.  Site was prepped in a sterile fashion; and small abscess was opened with an 11 blade scalpel.  Very small amount of mixture of serous and purulent fluid drained from abscess site.  Wound culture was obtained.  Packing inserted into abscess site; and dressing applied.  Advised the patient that the packing may very well.  Follow out on its own within the next 24 hours; the patient may actually remove the packing as well.  Also, advised patient to call/return if she develops any worsening symptoms whatsoever.  Of note-blood cultures 1 was also obtained today.  Will hold on any further antibiotics until all culture results obtained.

## 2014-09-26 NOTE — Assessment & Plan Note (Signed)
Small abscess to left chest wall previous portacath site.   See Dr. Ernestina Penna note from today's visit.

## 2014-09-26 NOTE — Progress Notes (Signed)
Abscess drainage only. See note as addendum to Dr. Ernestina Penna visit note from earlier today.

## 2014-09-27 ENCOUNTER — Telehealth: Payer: Self-pay

## 2014-09-27 NOTE — Telephone Encounter (Signed)
Called to follow up with pt after 6/22 visit with [facility] visit. Pt states she repacked her wound yesterday after showering and cleaning it. States it looks well just got a little bit of red fluid out of it but no yellow discharge. Informed pt to keep cleaning it and dressing it. Informed pt to call if it worsens or has any other problems, questions or concerns. Pt verbalized understanding.

## 2014-09-28 ENCOUNTER — Telehealth: Payer: Self-pay | Admitting: Nurse Practitioner

## 2014-09-28 ENCOUNTER — Other Ambulatory Visit: Payer: Self-pay | Admitting: Nurse Practitioner

## 2014-09-28 DIAGNOSIS — L03313 Cellulitis of chest wall: Secondary | ICD-10-CM

## 2014-09-28 LAB — WOUND CULTURE

## 2014-09-28 MED ORDER — LEVOFLOXACIN 500 MG PO TABS
500.0000 mg | ORAL_TABLET | Freq: Every day | ORAL | Status: DC
Start: 2014-09-28 — End: 2014-10-16

## 2014-09-28 NOTE — Telephone Encounter (Signed)
Left upper chest wall previous portacath site would culture returned positive for pseudomonas.  Called pt to review these new findings.  Blood culture results still pending.   Advised pt would call in Levaquin abx- which is sensitive per culture results.   Pt states that she has an appt with Dr. Barry Dienes surgeon for Monday 09/30/14.    Will review all with Dr. Burr Medico on Monday 09/30/14 as well.   Pt encouraged to go directly to the ED over the weekend for any worsening symptoms or fever/chills.

## 2014-10-01 LAB — CULTURE, BLOOD (SINGLE)

## 2014-10-14 ENCOUNTER — Other Ambulatory Visit: Payer: Managed Care, Other (non HMO)

## 2014-10-16 ENCOUNTER — Ambulatory Visit (HOSPITAL_BASED_OUTPATIENT_CLINIC_OR_DEPARTMENT_OTHER): Payer: Managed Care, Other (non HMO)

## 2014-10-16 ENCOUNTER — Other Ambulatory Visit (HOSPITAL_BASED_OUTPATIENT_CLINIC_OR_DEPARTMENT_OTHER): Payer: Managed Care, Other (non HMO)

## 2014-10-16 ENCOUNTER — Telehealth: Payer: Self-pay | Admitting: *Deleted

## 2014-10-16 ENCOUNTER — Telehealth: Payer: Self-pay | Admitting: Hematology

## 2014-10-16 ENCOUNTER — Ambulatory Visit (HOSPITAL_BASED_OUTPATIENT_CLINIC_OR_DEPARTMENT_OTHER): Payer: Managed Care, Other (non HMO) | Admitting: Nurse Practitioner

## 2014-10-16 VITALS — BP 124/74 | HR 74 | Temp 98.9°F | Resp 18 | Ht 65.0 in | Wt 156.9 lb

## 2014-10-16 DIAGNOSIS — C50412 Malignant neoplasm of upper-outer quadrant of left female breast: Secondary | ICD-10-CM

## 2014-10-16 DIAGNOSIS — Z5112 Encounter for antineoplastic immunotherapy: Secondary | ICD-10-CM

## 2014-10-16 DIAGNOSIS — Z170 Estrogen receptor positive status [ER+]: Secondary | ICD-10-CM

## 2014-10-16 DIAGNOSIS — D6481 Anemia due to antineoplastic chemotherapy: Secondary | ICD-10-CM

## 2014-10-16 LAB — COMPREHENSIVE METABOLIC PANEL (CC13)
ALT: 13 U/L (ref 0–55)
AST: 17 U/L (ref 5–34)
Albumin: 3.6 g/dL (ref 3.5–5.0)
Alkaline Phosphatase: 73 U/L (ref 40–150)
Anion Gap: 7 mEq/L (ref 3–11)
BUN: 21.5 mg/dL (ref 7.0–26.0)
CO2: 28 mEq/L (ref 22–29)
CREATININE: 0.9 mg/dL (ref 0.6–1.1)
Calcium: 9.7 mg/dL (ref 8.4–10.4)
Chloride: 108 mEq/L (ref 98–109)
EGFR: 83 mL/min/{1.73_m2} — AB (ref >90)
GLUCOSE: 80 mg/dL (ref 70–140)
POTASSIUM: 3.7 meq/L (ref 3.5–5.1)
Sodium: 143 mEq/L (ref 136–145)
Total Bilirubin: 0.52 mg/dL (ref 0.20–1.20)
Total Protein: 7 g/dL (ref 6.4–8.3)

## 2014-10-16 LAB — CBC WITH DIFFERENTIAL/PLATELET
BASO%: 1.1 % (ref 0.0–2.0)
Basophils Absolute: 0 10*3/uL (ref 0.0–0.1)
EOS ABS: 0.2 10*3/uL (ref 0.0–0.5)
EOS%: 5.5 % (ref 0.0–7.0)
HEMATOCRIT: 37 % (ref 34.8–46.6)
HEMOGLOBIN: 12.1 g/dL (ref 11.6–15.9)
LYMPH%: 16.4 % (ref 14.0–49.7)
MCH: 28.8 pg (ref 25.1–34.0)
MCHC: 32.7 g/dL (ref 31.5–36.0)
MCV: 88.1 fL (ref 79.5–101.0)
MONO#: 0.4 10*3/uL (ref 0.1–0.9)
MONO%: 10.3 % (ref 0.0–14.0)
NEUT%: 66.7 % (ref 38.4–76.8)
NEUTROS ABS: 2.5 10*3/uL (ref 1.5–6.5)
Platelets: 180 10*3/uL (ref 145–400)
RBC: 4.2 10*6/uL (ref 3.70–5.45)
RDW: 17.8 % — AB (ref 11.2–14.5)
WBC: 3.7 10*3/uL — AB (ref 3.9–10.3)
lymph#: 0.6 10*3/uL — ABNORMAL LOW (ref 0.9–3.3)

## 2014-10-16 MED ORDER — DIPHENHYDRAMINE HCL 25 MG PO CAPS
50.0000 mg | ORAL_CAPSULE | Freq: Once | ORAL | Status: AC
  Administered 2014-10-16: 50 mg via ORAL

## 2014-10-16 MED ORDER — ACETAMINOPHEN 325 MG PO TABS
650.0000 mg | ORAL_TABLET | Freq: Once | ORAL | Status: AC
  Administered 2014-10-16: 650 mg via ORAL

## 2014-10-16 MED ORDER — DIPHENHYDRAMINE HCL 25 MG PO CAPS
ORAL_CAPSULE | ORAL | Status: AC
  Filled 2014-10-16: qty 2

## 2014-10-16 MED ORDER — SODIUM CHLORIDE 0.9 % IV SOLN
Freq: Once | INTRAVENOUS | Status: AC
  Administered 2014-10-16: 13:00:00 via INTRAVENOUS

## 2014-10-16 MED ORDER — TRASTUZUMAB CHEMO INJECTION 440 MG
6.0000 mg/kg | Freq: Once | INTRAVENOUS | Status: AC
  Administered 2014-10-16: 420 mg via INTRAVENOUS
  Filled 2014-10-16: qty 20

## 2014-10-16 MED ORDER — METHYLPREDNISOLONE SODIUM SUCC 40 MG IJ SOLR
INTRAMUSCULAR | Status: AC
  Filled 2014-10-16: qty 1

## 2014-10-16 MED ORDER — ACETAMINOPHEN 325 MG PO TABS
ORAL_TABLET | ORAL | Status: AC
  Filled 2014-10-16: qty 2

## 2014-10-16 MED ORDER — METHYLPREDNISOLONE SODIUM SUCC 40 MG IJ SOLR
40.0000 mg | Freq: Once | INTRAMUSCULAR | Status: AC
  Administered 2014-10-16: 40 mg via INTRAVENOUS

## 2014-10-16 NOTE — Progress Notes (Signed)
[facility] OFFICE PROGRESS NOTE   Diagnosis: Breast cancer  SUMMARY OF ONCOLOGIC HISTORY: Oncology History   Breast cancer of upper-outer quadrant of left female breast  Staging form: Breast, AJCC 7th Edition  Clinical: Stage IIB (T3, N0, cM0) - Unsigned  Staging comments: Staged at breast conference 8.5.15   Pathologic stage from 05/08/2014: Stage IIA (yT1c(m), N1a, cM0) - Signed by Seward Grater, MD on 05/15/2014  Staging comments: Staged on final mastectomy specimen by Dr. Donato Heinz        Breast cancer of upper-outer quadrant of left female breast   05/08/2014 Surgery left mastectomy and SLN biopsy, negative margins. Path showed 2 lesions 0.5cm and 1.4cm, [id number]   10/17/2013 Mammogram There is persistent distortion in the left upper outer quadrant, corresponding to the screening mammographic finding.    10/26/2013 Breast US Ultrasound is performed, showing a hypoechoic irregular mass with surrounding distortion in the left breast 2 o'clock location 4 cm from the nipple measuring 1.1 x 0.9 x 0.7 cm. This corresponds to the mammographic finding. 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 - CALCIFICATIONS IDENTIFIED   10/30/2013 Receptors her2 tumor molecular markers: ER 100%, POSITIVE, PR: 42%, POSITIVE, Ki67: 53%; HER2 (+) (ration 2.8, copy number 3.5)   11/05/2013 Breast MRI Irregular enhancing mass measuring 5.1 cm in the upper and upper-outer quadrants of the left breast. The MR enhancing mass is much larger than the mass seen mammographically or sonographically.    11/05/2013 Breast MRI Suspicious level 1 left axillary lymph node concerning for metastatic involving. But [place] negative, no node biopsy.    11/07/2013 Initial Diagnosis Breast cancer of upper-outer quadrant of left female  breast   11/26/2013 - 03/11/2014 Chemotherapy 6 cycles of neoadjuvant chemotherapy with TCH-P (Taxotere, Carbolatin, Herceptin and Perjeta followed by Neulasta   04/08/2014 -  Chemotherapy Herceptin maintenance    05/08/2014 Surgery Left mastectomy, margins are negative. Surgical past showed multifocal invasive ductal carcinoma (0.5 cm, 1.4 cm), 2/2 SLN were positive.   07/10/2014 - 08/23/2014 Radiation Therapy adjuvant radiation to Left chest wall/regional lymph nodes 5040 cGy in 28 sessions, left chest wall boost 1000 cGy in 5 sessions   08/22/2014 Procedure port removal. Tip culture (+) Pseudomonas.        CURRENT THERAPY: Herceptin maintenance therapy started on 04/08/2014, every 3 weeks, plan to finish in mid Aug 2016. Arimidex initiated June 2016.  INTERVAL HISTORY:   Ms. Graley returns as scheduled. She continues Arimidex. No consistent joint pains. "Minimal" hot flashes. She is taking calcium and vitamin D. She has occasional nausea. No vomiting. No mouth sores. No diarrhea. She denies shortness of breath and chest pain. No leg swelling or calf pain. No fever. She reports stable mild erythema surrounding the Port-A-Cath scar.  Objective:  Vital signs in last 24 hours:  Blood pressure 124/74, pulse 74, temperature 98.9 F (37.2 C), temperature source Oral, resp. rate 18, height '5\' 5"'  (1.651 m), weight 156 lb 14.4 oz (71.169 kg), SpO2 100 %.    HEENT: No thrush or ulcers. Lymphatics: No palpable cervical, supravesicular axillary lymph nodes.  Resp: Lungs clear bilaterally. Cardio: Regular rate and rhythm. GI: Abdomen soft and nontender. No hepatomegaly. Vascular: No leg edema.  Skin: Mild erythema surrounding the Port-A-Cath scar. No fluctuance. Breast: Status post left mastectomy. Hyperpigmentation over the left chest wall. No evidence of local  tumor recurrence.    Lab Results:  Lab Results  Component Value Date   WBC 3.7* 10/16/2014   HGB 12.1 10/16/2014    HCT 37.0 10/16/2014   MCV 88.1 10/16/2014   PLT 180 10/16/2014   NEUTROABS 2.5 10/16/2014    Imaging:  No results found.  Medications: I have reviewed the patient's current medications.  Assessment/Plan: 1.  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 Axillary lymph node dissection discussed and recommended. Patient declined surgery due to concern of lymphedema. She opted for adjuvant radiation including the axillary area.  She is continuing maintenance Herceptin through August 2016.  Arimidex initiated June 2016. 2. History of left chest wall cellulitis and port infection with pseudomonas. Port-A-Cath has been removed. Small abscess left chest wall 09/25/2014 status post drainage with culture returning positive for pseudomonas. She completed a course of Levaquin. 3. History of anemia secondary to chemotherapy   Disposition: Ms. Forstrom appears stable. Plan to proceed with Herceptin today as scheduled. She will return for a follow-up visit and Herceptin 11/06/2014. She will contact the office in the interim with any problems. We specifically discussed spreading redness or swelling around the Port-A-Cath scar.  Plan reviewed with Dr. Burr Medico.    Ned Card ANP/GNP-BC   10/16/2014  11:24 AM

## 2014-10-16 NOTE — Telephone Encounter (Signed)
Per staff message and POF I have scheduled appts. Advised scheduler of appts. JMW  

## 2014-10-16 NOTE — Telephone Encounter (Signed)
Pt confirmed labs/ov per 07/13 POF, gave pt AVS and Calendar.... KJ, sent msg to add Herceptin

## 2014-10-16 NOTE — Patient Instructions (Signed)
[facility] Discharge Instructions for Patients Receiving Chemotherapy  Today you received the following chemotherapy agents Herceptin.  To help prevent nausea and vomiting after your treatment, we encourage you to take your nausea medication Compazine 10mg  every 6 hours as needed.   If you develop nausea and vomiting that is not controlled by your nausea medication, call the clinic.   BELOW ARE SYMPTOMS THAT SHOULD BE REPORTED IMMEDIATELY:  *FEVER GREATER THAN 100.5 F  *CHILLS WITH OR WITHOUT FEVER  NAUSEA AND VOMITING THAT IS NOT CONTROLLED WITH YOUR NAUSEA MEDICATION  *UNUSUAL SHORTNESS OF BREATH  *UNUSUAL BRUISING OR BLEEDING  TENDERNESS IN MOUTH AND THROAT WITH OR WITHOUT PRESENCE OF ULCERS  *URINARY PROBLEMS  *BOWEL PROBLEMS  UNUSUAL RASH Items with * indicate a potential emergency and should be followed up as soon as possible.  Feel free to call the clinic you have any questions or concerns. The clinic phone number is [phone].  Please show the [place] at check-in to the Emergency Department and triage nurse.

## 2014-10-21 ENCOUNTER — Other Ambulatory Visit: Payer: Self-pay | Admitting: *Deleted

## 2014-10-21 ENCOUNTER — Telehealth: Payer: Self-pay | Admitting: *Deleted

## 2014-10-21 DIAGNOSIS — C50412 Malignant neoplasm of upper-outer quadrant of left female breast: Secondary | ICD-10-CM

## 2014-10-21 MED ORDER — ANASTROZOLE 1 MG PO TABS: 1.0000 mg | ORAL_TABLET | Freq: Every day | ORAL | Status: DC

## 2014-10-21 NOTE — Telephone Encounter (Signed)
Pt called and left message re:  Pt has been tolerating Arimidex fine;  Pt would like to have a 90 day supply of Arimidex sent to [facility]. [employer]       [phone]    ;   Fax      [phone]. Pt's  ID   [id number]  [id number].

## 2014-11-04 ENCOUNTER — Other Ambulatory Visit: Payer: Managed Care, Other (non HMO)

## 2014-11-06 ENCOUNTER — Ambulatory Visit (HOSPITAL_BASED_OUTPATIENT_CLINIC_OR_DEPARTMENT_OTHER): Payer: Managed Care, Other (non HMO) | Admitting: Hematology

## 2014-11-06 ENCOUNTER — Other Ambulatory Visit (HOSPITAL_BASED_OUTPATIENT_CLINIC_OR_DEPARTMENT_OTHER): Payer: Managed Care, Other (non HMO)

## 2014-11-06 ENCOUNTER — Other Ambulatory Visit: Payer: Self-pay | Admitting: *Deleted

## 2014-11-06 ENCOUNTER — Ambulatory Visit (HOSPITAL_BASED_OUTPATIENT_CLINIC_OR_DEPARTMENT_OTHER): Payer: Managed Care, Other (non HMO)

## 2014-11-06 ENCOUNTER — Encounter: Payer: Self-pay | Admitting: Hematology

## 2014-11-06 VITALS — BP 128/75 | HR 70 | Temp 98.6°F | Resp 18 | Ht 65.0 in | Wt 157.6 lb

## 2014-11-06 DIAGNOSIS — E119 Type 2 diabetes mellitus without complications: Secondary | ICD-10-CM

## 2014-11-06 DIAGNOSIS — I10 Essential (primary) hypertension: Secondary | ICD-10-CM | POA: Diagnosis not present

## 2014-11-06 DIAGNOSIS — C50412 Malignant neoplasm of upper-outer quadrant of left female breast: Secondary | ICD-10-CM | POA: Diagnosis not present

## 2014-11-06 DIAGNOSIS — Z5112 Encounter for antineoplastic immunotherapy: Secondary | ICD-10-CM

## 2014-11-06 DIAGNOSIS — Z170 Estrogen receptor positive status [ER+]: Secondary | ICD-10-CM | POA: Diagnosis not present

## 2014-11-06 DIAGNOSIS — Z79811 Long term (current) use of aromatase inhibitors: Secondary | ICD-10-CM

## 2014-11-06 LAB — COMPREHENSIVE METABOLIC PANEL (CC13)
ALBUMIN: 3.6 g/dL (ref 3.5–5.0)
ALT: 14 U/L (ref 0–55)
AST: 18 U/L (ref 5–34)
Alkaline Phosphatase: 72 U/L (ref 40–150)
Anion Gap: 5 mEq/L (ref 3–11)
BUN: 21.5 mg/dL (ref 7.0–26.0)
CO2: 32 meq/L — AB (ref 22–29)
Calcium: 9.7 mg/dL (ref 8.4–10.4)
Chloride: 107 mEq/L (ref 98–109)
Creatinine: 0.9 mg/dL (ref 0.6–1.1)
EGFR: 79 mL/min/{1.73_m2} — ABNORMAL LOW (ref >90)
Glucose: 91 mg/dl (ref 70–140)
Potassium: 4.5 mEq/L (ref 3.5–5.1)
SODIUM: 144 meq/L (ref 136–145)
Total Bilirubin: 0.53 mg/dL (ref 0.20–1.20)
Total Protein: 6.7 g/dL (ref 6.4–8.3)

## 2014-11-06 LAB — CBC WITH DIFFERENTIAL/PLATELET
BASO%: 1.2 % (ref 0.0–2.0)
Basophils Absolute: 0 10*3/uL (ref 0.0–0.1)
EOS ABS: 0.1 10*3/uL (ref 0.0–0.5)
EOS%: 4.2 % (ref 0.0–7.0)
HEMATOCRIT: 36.3 % (ref 34.8–46.6)
HGB: 12 g/dL (ref 11.6–15.9)
LYMPH#: 0.8 10*3/uL — AB (ref 0.9–3.3)
LYMPH%: 23.9 % (ref 14.0–49.7)
MCH: 29 pg (ref 25.1–34.0)
MCHC: 33 g/dL (ref 31.5–36.0)
MCV: 87.9 fL (ref 79.5–101.0)
MONO#: 0.5 10*3/uL (ref 0.1–0.9)
MONO%: 12.9 % (ref 0.0–14.0)
NEUT#: 2 10*3/uL (ref 1.5–6.5)
NEUT%: 57.8 % (ref 38.4–76.8)
Platelets: 179 10*3/uL (ref 145–400)
RBC: 4.13 10*6/uL (ref 3.70–5.45)
RDW: 16.3 % — ABNORMAL HIGH (ref 11.2–14.5)
WBC: 3.5 10*3/uL — ABNORMAL LOW (ref 3.9–10.3)

## 2014-11-06 MED ORDER — METHYLPREDNISOLONE SODIUM SUCC 40 MG IJ SOLR
INTRAMUSCULAR | Status: AC
  Filled 2014-11-06: qty 1

## 2014-11-06 MED ORDER — DIPHENHYDRAMINE HCL 25 MG PO CAPS
50.0000 mg | ORAL_CAPSULE | Freq: Once | ORAL | Status: AC
  Administered 2014-11-06: 50 mg via ORAL

## 2014-11-06 MED ORDER — ACETAMINOPHEN 325 MG PO TABS
ORAL_TABLET | ORAL | Status: AC
  Filled 2014-11-06: qty 2

## 2014-11-06 MED ORDER — ACETAMINOPHEN 325 MG PO TABS
650.0000 mg | ORAL_TABLET | Freq: Once | ORAL | Status: AC
  Administered 2014-11-06: 650 mg via ORAL

## 2014-11-06 MED ORDER — METHYLPREDNISOLONE SODIUM SUCC 40 MG IJ SOLR
40.0000 mg | Freq: Once | INTRAMUSCULAR | Status: AC
  Administered 2014-11-06: 40 mg via INTRAVENOUS

## 2014-11-06 MED ORDER — DIPHENHYDRAMINE HCL 25 MG PO CAPS
ORAL_CAPSULE | ORAL | Status: AC
  Filled 2014-11-06: qty 2

## 2014-11-06 MED ORDER — TRASTUZUMAB CHEMO INJECTION 440 MG
6.0000 mg/kg | Freq: Once | INTRAVENOUS | Status: AC
  Administered 2014-11-06: 420 mg via INTRAVENOUS
  Filled 2014-11-06: qty 20

## 2014-11-06 MED ORDER — SODIUM CHLORIDE 0.9 % IV SOLN
Freq: Once | INTRAVENOUS | Status: AC
  Administered 2014-11-06: 11:00:00 via INTRAVENOUS

## 2014-11-06 NOTE — Patient Instructions (Signed)
[facility] Discharge Instructions for Patients Receiving Chemotherapy  Today you received the following chemotherapy agents:  Herceptin  To help prevent nausea and vomiting after your treatment, we encourage you to take your nausea medication as prescribed.   If you develop nausea and vomiting that is not controlled by your nausea medication, call the clinic.   BELOW ARE SYMPTOMS THAT SHOULD BE REPORTED IMMEDIATELY:  *FEVER GREATER THAN 100.5 F  *CHILLS WITH OR WITHOUT FEVER  NAUSEA AND VOMITING THAT IS NOT CONTROLLED WITH YOUR NAUSEA MEDICATION  *UNUSUAL SHORTNESS OF BREATH  *UNUSUAL BRUISING OR BLEEDING  TENDERNESS IN MOUTH AND THROAT WITH OR WITHOUT PRESENCE OF ULCERS  *URINARY PROBLEMS  *BOWEL PROBLEMS  UNUSUAL RASH Items with * indicate a potential emergency and should be followed up as soon as possible.  Feel free to call the clinic you have any questions or concerns. The clinic phone number is [phone].  Please show the CHEMO ALERT CARD at check-in to the Emergency Department and triage nurse.   

## 2014-11-06 NOTE — Progress Notes (Signed)
[place] OFFICE PROGRESS NOTE  Patient Care Team: Seward Carol, MD as PCP - General (Internal Medicine) [name], MD as Consulting Physician (General Surgery)  SUMMARY OF ONCOLOGIC HISTORY: Oncology History   Breast cancer of upper-outer quadrant of left female breast   Staging form: Breast, AJCC 7th Edition     Clinical: Stage IIB (T3, N0, cM0) - Unsigned       Staging comments: Staged at breast conference 8.5.15      Pathologic stage from 05/08/2014: Stage IIA (yT1c(m), N1a, cM0) - Signed by Seward Grater, MD on 05/15/2014       Staging comments: Staged on final mastectomy specimen by Dr. Donato Heinz        Breast cancer of upper-outer quadrant of left female breast   05/08/2013 Surgery left mastectomy and SLN biopsy, negative margins. Path showed 2 lesions 0.5cm an d1.4cm, ympT1cpN1aMx   10/17/2013 Mammogram There is persistent distortion in the left upper outer quadrant, corresponding to the screening mammographic finding.    10/26/2013 Breast US Ultrasound is performed, showing a hypoechoic irregular mass with surrounding distortion in the left breast 2 o'clock location 4 cm from the nipple measuring 1.1 x 0.9 x 0.7 cm. This corresponds to the mammographic finding. 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 - CALCIFICATIONS IDENTIFIED   10/30/2013 Receptors her2 tumor molecular markers: ER 100%, POSITIVE, PR: 42%, POSITIVE, Ki67: 53%; HER2 (+) (ration 2.8, copy number 3.5)   11/05/2013 Breast MRI Irregular enhancing mass measuring 5.1 cm in the upper and upper-outer quadrants of the left breast. The MR enhancing mass is much larger than the mass seen mammographically or sonographically.    11/05/2013 Breast MRI Suspicious level 1 left axillary lymph node concerning for metastatic involving. But [place] negative, no node biopsy.    11/07/2013  Initial Diagnosis Breast cancer of upper-outer quadrant of left female breast   11/26/2013 - 03/11/2014 Chemotherapy 6 cycles of neoadjuvant chemotherapy with TCH-P (Taxotere, Carbolatin, Herceptin and Perjeta followed by Neulasta   04/08/2014 -  Chemotherapy Herceptin maintenance    05/08/2014 Surgery Left mastectomy, margins are negative. Surgical past showed multifocal invasive ductal carcinoma (0.5 cm, 1.4 cm), 2/2 SLN were positive.   07/10/2014 - 08/23/2014 Radiation Therapy adjuvant radiation to Left chest wall/regional lymph nodes 5040 cGy in 28 sessions, left chest wall boost 1000 cGy in 5 sessions   08/22/2014 Procedure port removal. Tip culture (+) Pseudomonas.      CURRENT THERAPY: Herceptin maintenance therapy started on 04/08/2014, every 3 weeks, plan to finish in mid Aug 2016   INTERVAL HISTORY: Rachel [name] returns for follow up and Herceptin treatment. She is doing well overall. She is tolerateing letrazole well, mild hot flush, and a few episode of right elbow mild swelling and pain, which resolved. No other joint or muscular complaints. Her previous port site has been healed well, no will skin ulcers or abscess.    REVIEW OF SYSTEMS:   Constitutional: Denies fevers, chills.  Eyes: Denies blurriness of vision Ears, nose, mouth, throat, and face: Denies mucositis or sore throat Respiratory: Denies cough, dyspnea or wheezes Cardiovascular: Denies palpitation, chest discomfort or lower extremity swelling Gastrointestinal:  Denies nausea, heartburn or change in bowel habits Skin: Denies abnormal skin rashes except a blister at the old port site Lymphatics: Denies new lymphadenopathy or easy bruising Neurological:Denies numbness, tingling or new weaknesses Behavioral/Psych:  Mood is stable, no new changes  All other systems were reviewed with the patient and are negative except those in history   MEDICAL HISTORY:  Past Medical History  Diagnosis Date  . Hypertension   . Alopecia areata    . Status post chemotherapy 11/26/2013 - 03/11/2014    6 cycles of neoadjuvant chemotherapy with TCH-P (Taxotere, Carbolatin, Herceptin and Perjeta followed by Neulasta  . Hyperlipidemia   . Type 2 diabetes, diet controlled   . History of endometriosis   . Infection due to port-a-cath     left chest area w/ cellulitis and skin ulcer   . Breast cancer oncologist-  dr Krista Blue Renaldo Gornick/  dx 07/ 2015--  DCIS left breast cT3, N0, Stage IIB  triple positive ,  ER+PR+HER2+/       s/p  chemotherapy 11-26-2013 to 03-11-2014 (after chemo, yT1cN1a)--  s/p  left mastectomy w/ SLN bx 05-08-2014/  radiation therapy currently started 07-10-2014  . Anemia due to chemotherapy   . 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 S/P radiation therapy 07/10/2014 through 08/23/2014     Left chest wall/regional lymph nodes 5040 cGy in 28 sessions, left chest wall boost 1000 cGy in 5 sessions     SURGICAL HISTORY: Past Surgical History  Procedure Laterality Date  . Colonoscopy    . Portacath placement N/A 11/20/2013    Procedure: INSERTION PORT-A-CATH;  Surgeon: [name], MD;  Location: [place];  Service: General;  Laterality: N/A;  . Breast surgery Left 7/15    bx  . Mastectomy w/ sentinel node biopsy Left 05/08/2014    Procedure: LEFT MASTECTOMY WITH SENTINEL LYMPH NODE BIOPSY;  Surgeon: [name], MD;  Location: [place];  Service: General;  Laterality: Left;  . Transthoracic echocardiogram  05-30-2014    grade I diastolic dysfunction/ ef 23-76%/  moderate diffuse calcification AV without stenosis/  trivial MR and TR  . Abdominal hysterectomy  1995    and 1996 Laparosopic Bilateral Salpingoophorectomy  . Port-a-cath removal Left 08/22/2014    Procedure: REMOVAL PORT-A-CATH;  Surgeon: [name], MD;  Location: [facility];  Service: General;  Laterality: Left;    I have reviewed the social history and family history  with the patient and they are unchanged from previous note.  ALLERGIES:  is allergic to eggs or egg-derived products; nickel; sulfa antibiotics; neosporin; multihance; penicillins; and tetanus toxoids.  MEDICATIONS:    Medication List       This list is accurate as of: 11/06/14  8:13 PM.  Always use your most recent med list.               acetaminophen 500 MG tablet  Commonly known as:  TYLENOL  Take 500 mg by mouth every 6 (six) hours as needed (pain).     anastrozole 1 MG tablet  Commonly known as:  ARIMIDEX  Take 1 tablet (1 mg total) by mouth daily.     calcium citrate-vitamin D 315-200 MG-UNIT per tablet  Commonly known as:  CITRACAL+D  Take 1 tablet by mouth daily.     HERCEPTIN IV  Inject 1 each into the vein See admin instructions. Every 3 weeks-unknown dose-last infusion 05/20/14     hydrochlorothiazide 25 MG tablet  Commonly known as:  HYDRODIURIL  Take 25 mg by mouth every morning.     multivitamin with minerals Tabs tablet  Take 1 tablet by mouth daily.     prochlorperazine 10 MG tablet  Commonly known as:  COMPAZINE  Take 10 mg by mouth every 6 (six) hours as needed for nausea or vomiting.         PHYSICAL EXAMINATION: ECOG PERFORMANCE STATUS: 0 BP 128/75 mmHg  Pulse 70  Temp(Src) 98.6 F (37 C) (Oral)  Resp 18  Ht _0  (1.651 m)  Wt 157 lb 9.6 oz (71.487 kg)  BMI 26.23 kg/m2  SpO2 100% GENERAL:alert, no distress and comfortable SKIN: skin color, texture, turgor are normal, no rashes or significant lesions except (+) small confluent folliculitis/abscess at the previous port site on left upper chest. (+) skin pigmentation at the left mastectomy site secondary to radiation. EYES: normal, Conjunctiva are pink and non-injected, sclera clear OROPHARYNX:no exudate, no erythema and lips, buccal mucosa, and tongue normal  NECK: supple, thyroid normal size, non-tender, without nodularity LYMPH:  no palpable lymphadenopathy in the cervical, axillary or  inguinal LUNGS: clear to auscultation and percussion with normal breathing effort HEART: regular rate & rhythm and no murmurs and no lower extremity edema ABDOMEN:abdomen soft, non-tender and normal bowel sounds Musculoskeletal:no cyanosis of digits and no clubbing  NEURO: alert & oriented x 3 with fluent speech, no focal motor/sensory deficits Breasts: Status post left mastectomy, surgical wound healing well, small scar at the previous port site.  (+) skin pigmentation secondary to radiation. Palpation of the right breasts and axilla revealed no obvious mass that I could appreciate. EXTREMITIES: (+) Thickening of the nailbeds, no discharge from the left middle finger nail bed, no surrounding skin edema or redness.   LABORATORY DATA:  I have reviewed the data as listed CBC Latest Ref Rng 11/06/2014 10/16/2014 09/25/2014  WBC 3.9 - 10.3 10e3/uL 3.5(L) 3.7(L) 3.4(L)  Hemoglobin 11.6 - 15.9 g/dL 12.0 12.1 10.8(L)  Hematocrit 34.8 - 46.6 % 36.3 37.0 33.4(L)  Platelets 145 - 400 10e3/uL 179 180 171     CMP Latest Ref Rng 11/06/2014 10/16/2014 09/25/2014  Glucose 70 - 140 mg/dl 91 80 90  BUN 7.0 - 26.0 mg/dL 21.5 21.5 17.9  Creatinine 0.6 - 1.1 mg/dL 0.9 0.9 0.9  Sodium 136 - 145 mEq/L 144 143 144  Potassium 3.5 - 5.1 mEq/L 4.5 3.7 4.3  Chloride 101 - 111 mmol/L - - -  CO2 22 - 29 mEq/L 32(H) 28 32(H)  Calcium 8.4 - 10.4 mg/dL 9.7 9.7 9.5  Total Protein 6.4 - 8.3 g/dL 6.7 7.0 6.6  Total Bilirubin 0.20 - 1.20 mg/dL 0.53 0.52 0.98  Alkaline Phos 40 - 150 U/L 72 73 68  AST 5 - 34 U/L _1 ALT 0 - 55 U/L _2 PATHOLOGY REPORT: Diagnosis 1. Breast, simple mastectomy, left 05/08/2014 - PENDING STAINS. - MULTIFOCAL INVASIVE DUCTAL CARCINOMA WITH NEOADJUVANT RELATED CHANGES, SEE COMMENT. - NEGATIVE FOR LYMPH VASCULAR INVASION. - INVASIVE TUMOR IS 4 MM FROM NEAREST MARGIN (ANTERIOR). - DUCTAL CARCINOMA IN SITU - PREVIOUS BIOPSY SITE. - BENIGN SKIN; NEGATIVE FOR TUMOR - SEE TUMOR  SYNOPTIC TEMPLATE BELOW. 2. Lymph node, sentinel, biopsy, left axillary #1 - ONE LYMPH NODE, POSITIVE FOR METASTATIC MAMMARY CARCINOMA (1/1). - INTRANODAL TUMOR DEPOSIT IS 2 MM - NEGATIVE FOR EXTRACAPSULAR TUMOR EXTENSION. 3. Lymph node, sentinel, biopsy, left axillary #2 - ONE LYMPH NODE, POSITIVE FOR METASTATIC MAMMARY CARCINOMA (1/1). - INTRANODAL TUMOR DEPOSIT IS 4 MM - NEGATIVE FOR EXTRACAPSULAR TUMOR EXTENSION.  Microscopic Comment 1. BREAST, INVASIVE TUMOR, WITH LYMPH NODES PRESENT Specimen, including laterality and lymph node sampling (sentinel, non-sentinel): Left breast Procedure: Simple  mastectomy Histologic type: Ductal (x3 tumors), see comment. Grade: II of III (Tumor #1 and 2), see comment Tubule formation: 2 Nuclear pleomorphism: 3 Mitotic: 2 Grade: I of III (Tumor #3), see comment Tubule formation: 1 Nuclear pleomorphism: 1 Mitotic: 1 1 of 4 FINAL for ADAMS-FOUST, Nataly E ([id number]) Microscopic Comment(continued) Tumor size (glass slide measurement): 0.5 cm and 1.4 cm; gross measurement 2.0 cm, see comment. Margins: Invasive, distance to closest margin: 4 mm In-situ, distance to closest margin: 4 mm (anterior) If margin positive, focally or broadly: N/A Lymphovascular invasion: Absent Ductal carcinoma in situ: Present Grade: III of III Extensive intraductal component: Absent Lobular neoplasia: Pending stains Tumor focality: Multifocal Treatment effect: Present If present, treatment effect in breast tissue, lymph nodes or both: Tissue Extent of tumor: Skin: Negative Nipple: Negative Skeletal muscle: Negative Lymph nodes: Examined: 2 Sentinel 0 Non-sentinel 2 Total Lymph nodes with metastasis: 2 Isolated tumor cells (< 0.2 mm): 0 Micrometastasis: (> 0.2 mm and < 2.0 mm): 0 Macrometastasis: (> 2.0 mm): X 2 Extracapsular extension: Absent Breast prognostic profile: Estrogen receptor: Not repeated, previous study demonstrated 100% positivity and 100%  positivity ([id number] and [id number]) Progesterone receptor: Not repeated, previous study demonstrated 67%% and 42% positivity ([id number] and [id number]) Her 2 neu: Not repeated, previous study demonstrated amplification ([id number] and [id number]) Ki-67: Not repeated, previous study demonstrated 12% and 53% proliferation rate ([id number] and [id number]) Non-neoplastic breast: Previous biopsy site; fibrocystic change, usual ductal hyperplasia, calcifications and neoadjuvant related tissue changes. TNM: ympT1c, pN1a, pMX Comments: There are three different mass areas identified. The first central mass associated with barbell shaped clip demonstrates both in situ and invasive ductal carcinoma. Within the focus, the invasive ductal carcinoma spans 5 mm. The second posterior medial mass demonstrates both in situ and invasive ductal carcinoma spanning the entire 2.0 cm grossly identified stellate area. The third and final lateral mass with associated heart shaped clip demonstrates both in situ and invasive ductal carcinoma. The invasive tumor spans 1.4cm and is involved by and away from previous biopsy site. The lack of the myoepithelial layer was confirmed with smooth muscle myosin heavy chain, Calponin, and p63 immunostains (slide 1K).. The presence of strong diffuse E-Cadherin expression and absence of cytokeratin 5/6 expression supports a ductal phenotype to the in situ carcinoma (slide 1K). (CR:kh 05-09-14) Mali RUND DO Pathologist, Electronic Signature (Case signed 05/13/2014) Specimen Gross and Clinical Information  RADIOGRAPHIC STUDIES: I have personally reviewed the radiological images as listed and agreed with the findings in the report.  ECHO 05/30/2014  - Left ventricle: Global longitudinal LV strain was calculated at -13.5 but this appears inaccurate due to inaccurate tracking. The cavity size was normal. Systolic function was normal. The estimated ejection  fraction was in the range of 55% to 60%. Wall motion was normal; there were no regional wall motion abnormalities. There was an increased relative contribution of atrial contraction to ventricular filling. Doppler parameters are consistent with abnormal left ventricular relaxation (grade 1 diastolic dysfunction). - Aortic valve: A bicuspid morphology cannot be excluded. Moderate diffuse thickening and calcification. - Tricuspid valve: There was trivial regurgitation.  BREAST MRI 04/03/2014  IMPRESSION: 1. There has been significant improvement in the non mass enhancement in the upper-outer quadrant of the left breast. Persistent faint enhancement is identified now measuring up to 4.4 cm. One of the biopsy sites is within the area of enhancement. The second biopsy site is not associated with persistent enhancement on today's exam. 2. No suspicious lymph nodes identified on today's exam.  ASSESSMENT & PLAN:  This is a very pleasant 59 years-old African [place] female from [facility] who presents with a left Invasive Ductal Carcinoma. This was picked on a mammogram and maximal diameter was 1.7 cm on Ultrasound however the MRI breast showed this mass to be large almost 5.1 cm in maximal dimension making it a clinical T3 tumor. Also it showed a suspicious looking solitary lymph node in axilla but [place] was negative, not biopsied.  Clinical T3N0M0 Stage IIB.  The tumor biology indicates a LUMINAL-B type which means ER/PR/HER2 + tumor. Tumor is strongly ER+100%, PR 42%,KI-67 Index 53% showing a brisk mitotic rate and grade is called grade 2 (but grading as per pathologist cannot be relied upon on initial biopsy).   1. 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  -her restaging breast MRI results showed good partial response -I  discussed her surgical pathology findings with her in details. Unfortunately she had metastasis to 2 Sentinel lymph nodes. Axillary lymph node dissection was discussed, Dr. Barry Dienes, Dr. Valere Dross and me and we recommend to proceed ALND, which is the current standard care. She however declined surgery due to the concern of lymphedema. She opted adjuvant radiation including the axillary area.  -I'll continue her Herceptin every 3 weeks to complete a total of one year treatment, through 11/2014, last dose in 3 weeks  -will repeat her echo in early Sep  -Premedication with solumedro, tylenol and Benadryl before Herceptin due to the reaction  -She is tolerating anastrozole well, we'll continue. Given her very high risk of recurrence, I would recommend 10 year anastrozole, based on recent clinical trial data (N Engl J Med 2016; 375:209-219).  -We discussed breast cancer surveillance, with mammogram once a year, close follow-up every 3-4 months for the next 2-3 years then every 6-12 months -she is due for mammogram, will obtain in the next month  -We discussed survivorship visit, I'll refer her after she completes herceptin therapy   2. Bone health  - I encouraged her to continue taking calcium and vitamin D -We discussed the potential side effects of osteoporosis from anastrozole -We'll obtain a bone density scan in the next few months   2. Left chest wall cellulitis and port infection with pseudomonas -resolved now   3. Anemia, secondary to chemotherapy -resolved now   4. Grade 1 peripheral neuropathy, and a mild intermittent ankle swollen, nail changes.  Likely secondary to chemotherapy (docetaxel) -Stable. Continue observation     5. HTN, DM -Follow up with primary care physician  Plan -cont herceptin q3w, treatment today with premedication -RTC in 3 weeks for last cycle -echo in early sep  -mammo and bone density scan in the next month   All questions were answered. The patient knows to  call the clinic with any problems, questions or concerns. No barriers to learning was detected.  I spent 25 minutes counseling the patient face to face. The total time spent in the appointment was 30 minutes and more than 50% was on counseling and review of test results     Truitt Merle, MD 11/06/2014   8:13 PM

## 2014-11-07 ENCOUNTER — Telehealth: Payer: Self-pay | Admitting: Hematology

## 2014-11-07 NOTE — Telephone Encounter (Signed)
per pof to sch pt ECHO, mamma-DEXA-cld & sch-cld pt and gave pt time/date/location-pt stated time,date does not work for [employer] pt #@ [facility] to call & change appt-sent Tia Masker email to [employer] sch once reply-

## 2014-11-07 NOTE — Telephone Encounter (Signed)
returned call and s.w. pt and confirm appt....pt ok and aware °

## 2014-11-11 ENCOUNTER — Telehealth: Payer: Self-pay | Admitting: Hematology

## 2014-11-11 NOTE — Telephone Encounter (Signed)
s.w. pt and advised on ocho.Marland KitchenMarland KitchenMarland KitchenMarland Kitchenpt ok and aware

## 2014-11-12 ENCOUNTER — Ambulatory Visit (HOSPITAL_COMMUNITY)
Admission: RE | Admit: 2014-11-12 | Discharge: 2014-11-12 | Disposition: A | Discharge status: Home / Self Care | Payer: Managed Care, Other (non HMO) | Source: Ambulatory Visit | Attending: Hematology | Admitting: Hematology

## 2014-11-12 DIAGNOSIS — C50412 Malignant neoplasm of upper-outer quadrant of left female breast: Secondary | ICD-10-CM | POA: Insufficient documentation

## 2014-11-12 NOTE — Progress Notes (Signed)
Echocardiogram 2D Echocardiogram has been performed.  Rachel [name] 11/12/2014, 12:00 PM

## 2014-11-13 ENCOUNTER — Other Ambulatory Visit: Payer: Self-pay | Admitting: Hematology

## 2014-11-13 ENCOUNTER — Other Ambulatory Visit: Payer: Managed Care, Other (non HMO)

## 2014-11-13 ENCOUNTER — Ambulatory Visit
Admission: RE | Admit: 2014-11-13 | Discharge: 2014-11-13 | Disposition: A | Discharge status: Home / Self Care | Payer: Managed Care, Other (non HMO) | Source: Ambulatory Visit | Attending: Hematology | Admitting: Hematology

## 2014-11-13 DIAGNOSIS — C50412 Malignant neoplasm of upper-outer quadrant of left female breast: Secondary | ICD-10-CM

## 2014-11-13 DIAGNOSIS — Z1231 Encounter for screening mammogram for malignant neoplasm of breast: Secondary | ICD-10-CM

## 2014-11-15 ENCOUNTER — Telehealth: Payer: Self-pay | Admitting: *Deleted

## 2014-11-15 ENCOUNTER — Telehealth: Payer: Self-pay | Admitting: Hematology

## 2014-11-15 NOTE — Telephone Encounter (Signed)
Spoke with Tammy @ Dr. Lina Sar office and was informed that pt can have bone density done at her PCP office;  However, Tammy will need a diagnosis code to justify with pt's insurance.  Faxed last office notes to Tammy as ok with Dr. Burr Medico.  Pt is scheduled for bone density on Tues 11/19/14. Called pt at home and informed her of above progress.  Pt voiced understanding.

## 2014-11-15 NOTE — Telephone Encounter (Signed)
Patient called and left message that she had her mammo but was not able to do bone density due to calibrations and also dx and coding issues with Dr. Lina Sar office. Patient message forwarded to desk nurse to look into these issues. Spoke with patient she is aware.

## 2014-11-20 ENCOUNTER — Telehealth: Payer: Self-pay | Admitting: *Deleted

## 2014-11-20 NOTE — Telephone Encounter (Signed)
Notified pt of normal result of mammogram per Dr. Ernestina Penna request.

## 2014-11-25 ENCOUNTER — Other Ambulatory Visit: Payer: Managed Care, Other (non HMO)

## 2014-11-26 ENCOUNTER — Ambulatory Visit (HOSPITAL_BASED_OUTPATIENT_CLINIC_OR_DEPARTMENT_OTHER): Payer: Managed Care, Other (non HMO) | Admitting: Hematology

## 2014-11-26 ENCOUNTER — Ambulatory Visit (HOSPITAL_BASED_OUTPATIENT_CLINIC_OR_DEPARTMENT_OTHER): Payer: Managed Care, Other (non HMO)

## 2014-11-26 ENCOUNTER — Other Ambulatory Visit (HOSPITAL_BASED_OUTPATIENT_CLINIC_OR_DEPARTMENT_OTHER): Payer: Managed Care, Other (non HMO)

## 2014-11-26 ENCOUNTER — Encounter: Payer: Self-pay | Admitting: Radiation Oncology

## 2014-11-26 ENCOUNTER — Telehealth: Payer: Self-pay | Admitting: Hematology

## 2014-11-26 ENCOUNTER — Encounter: Payer: Self-pay | Admitting: Hematology

## 2014-11-26 ENCOUNTER — Ambulatory Visit
Admission: RE | Admit: 2014-11-26 | Discharge: 2014-11-26 | Disposition: A | Discharge status: Home / Self Care | Payer: Managed Care, Other (non HMO) | Source: Ambulatory Visit | Attending: Radiation Oncology | Admitting: Radiation Oncology

## 2014-11-26 VITALS — BP 123/70 | HR 77 | Temp 98.4°F | Resp 18 | Ht 65.0 in | Wt 157.5 lb

## 2014-11-26 VITALS — BP 127/77 | HR 67 | Temp 98.3°F | Ht 65.0 in | Wt 158.5 lb

## 2014-11-26 DIAGNOSIS — D6481 Anemia due to antineoplastic chemotherapy: Secondary | ICD-10-CM

## 2014-11-26 DIAGNOSIS — E119 Type 2 diabetes mellitus without complications: Secondary | ICD-10-CM | POA: Diagnosis not present

## 2014-11-26 DIAGNOSIS — Z5112 Encounter for antineoplastic immunotherapy: Secondary | ICD-10-CM

## 2014-11-26 DIAGNOSIS — C50412 Malignant neoplasm of upper-outer quadrant of left female breast: Secondary | ICD-10-CM

## 2014-11-26 DIAGNOSIS — L03313 Cellulitis of chest wall: Secondary | ICD-10-CM | POA: Diagnosis not present

## 2014-11-26 DIAGNOSIS — Z170 Estrogen receptor positive status [ER+]: Secondary | ICD-10-CM

## 2014-11-26 DIAGNOSIS — I10 Essential (primary) hypertension: Secondary | ICD-10-CM

## 2014-11-26 LAB — COMPREHENSIVE METABOLIC PANEL (CC13)
ALK PHOS: 64 U/L (ref 40–150)
ALT: 15 U/L (ref 0–55)
AST: 18 U/L (ref 5–34)
Albumin: 3.6 g/dL (ref 3.5–5.0)
Anion Gap: 9 mEq/L (ref 3–11)
BILIRUBIN TOTAL: 0.63 mg/dL (ref 0.20–1.20)
BUN: 21.2 mg/dL (ref 7.0–26.0)
CALCIUM: 9.5 mg/dL (ref 8.4–10.4)
CO2: 28 mEq/L (ref 22–29)
CREATININE: 1 mg/dL (ref 0.6–1.1)
Chloride: 109 mEq/L (ref 98–109)
EGFR: 73 mL/min/{1.73_m2} — ABNORMAL LOW (ref >90)
Glucose: 87 mg/dl (ref 70–140)
Potassium: 3.9 mEq/L (ref 3.5–5.1)
Sodium: 147 mEq/L — ABNORMAL HIGH (ref 136–145)
TOTAL PROTEIN: 6.5 g/dL (ref 6.4–8.3)

## 2014-11-26 LAB — CBC WITH DIFFERENTIAL/PLATELET
BASO%: 0.3 % (ref 0.0–2.0)
Basophils Absolute: 0 10*3/uL (ref 0.0–0.1)
EOS%: 2.4 % (ref 0.0–7.0)
Eosinophils Absolute: 0.1 10*3/uL (ref 0.0–0.5)
HEMATOCRIT: 36.5 % (ref 34.8–46.6)
HEMOGLOBIN: 12 g/dL (ref 11.6–15.9)
LYMPH#: 0.9 10*3/uL (ref 0.9–3.3)
LYMPH%: 24.9 % (ref 14.0–49.7)
MCH: 29 pg (ref 25.1–34.0)
MCHC: 32.9 g/dL (ref 31.5–36.0)
MCV: 88.2 fL (ref 79.5–101.0)
MONO#: 0.4 10*3/uL (ref 0.1–0.9)
MONO%: 9.5 % (ref 0.0–14.0)
NEUT#: 2.3 10*3/uL (ref 1.5–6.5)
NEUT%: 62.9 % (ref 38.4–76.8)
Platelets: 145 10*3/uL (ref 145–400)
RBC: 4.14 10*6/uL (ref 3.70–5.45)
RDW: 15 % — ABNORMAL HIGH (ref 11.2–14.5)
WBC: 3.7 10*3/uL — ABNORMAL LOW (ref 3.9–10.3)

## 2014-11-26 MED ORDER — SODIUM CHLORIDE 0.9 % IV SOLN
Freq: Once | INTRAVENOUS | Status: AC
  Administered 2014-11-26: 12:00:00 via INTRAVENOUS

## 2014-11-26 MED ORDER — METHYLPREDNISOLONE SODIUM SUCC 40 MG IJ SOLR
40.0000 mg | Freq: Once | INTRAMUSCULAR | Status: AC
  Administered 2014-11-26: 40 mg via INTRAVENOUS

## 2014-11-26 MED ORDER — DIPHENHYDRAMINE HCL 25 MG PO CAPS
ORAL_CAPSULE | ORAL | Status: AC
  Filled 2014-11-26: qty 2

## 2014-11-26 MED ORDER — DIPHENHYDRAMINE HCL 25 MG PO CAPS
50.0000 mg | ORAL_CAPSULE | Freq: Once | ORAL | Status: AC
  Administered 2014-11-26: 50 mg via ORAL

## 2014-11-26 MED ORDER — TRASTUZUMAB CHEMO INJECTION 440 MG
6.0000 mg/kg | Freq: Once | INTRAVENOUS | Status: AC
  Administered 2014-11-26: 420 mg via INTRAVENOUS
  Filled 2014-11-26: qty 20

## 2014-11-26 MED ORDER — ACETAMINOPHEN 325 MG PO TABS
650.0000 mg | ORAL_TABLET | Freq: Once | ORAL | Status: AC
  Administered 2014-11-26: 650 mg via ORAL

## 2014-11-26 MED ORDER — ACETAMINOPHEN 325 MG PO TABS
ORAL_TABLET | ORAL | Status: AC
  Filled 2014-11-26: qty 2

## 2014-11-26 MED ORDER — METHYLPREDNISOLONE SODIUM SUCC 40 MG IJ SOLR
INTRAMUSCULAR | Status: AC
  Filled 2014-11-26: qty 1

## 2014-11-26 NOTE — Telephone Encounter (Signed)
per pof tos ch pt appt-sent staff message to gretchen to [facility]-pt to get updated copy in trmt room b4 leaving

## 2014-11-26 NOTE — Progress Notes (Signed)
   Follow-up note:  Rachel [name] returns today approximately 3 months following completion of radiation therapy to her left chest wall and regional lymph nodes in the management of her clinical stage T3 N0, pathologic stage TI N1 invasive ductal carcinoma/DCIS of the left breast.  After her visit with me in late June she had incision and drainage of her Port-A-Cath site which apparently did not grow anything on culture.  She is off antibiotic and is without complaints today.  She is on anastrozole.  Right breast mammography earlier this month was negative, according to the patient.  Physical examination: Alert and oriented.  Nodes: There is no palpable cervical, supraclavicular, or axillary lymphadenopathy.  Chest there is marked hyperpigmentation along the left chest wall.  There is no evidence for infection along her left chest wall/Port-A-Cath site.  Right breast without masses or lesions.  Extremities: Without edema.  Impression: Satisfactory progress.  Plan: She'll maintain follow-up through Dr. Barry Dienes and Dr. Burr Medico.

## 2014-11-26 NOTE — Progress Notes (Signed)
[facility] OFFICE PROGRESS NOTE  Patient Care Team: Seward Carol, MD as PCP - General (Internal Medicine) [name], MD as Consulting Physician (General Surgery)  SUMMARY OF ONCOLOGIC HISTORY: Oncology History   Breast cancer of upper-outer quadrant of left female breast   Staging form: Breast, AJCC 7th Edition     Clinical: Stage IIB (T3, N0, cM0) - Unsigned       Staging comments: Staged at breast conference 8.5.15      Pathologic stage from 05/08/2014: Stage IIA (yT1c(m), N1a, cM0) - Signed by Seward Grater, MD on 05/15/2014       Staging comments: Staged on final mastectomy specimen by Dr. Donato Heinz        Breast cancer of upper-outer quadrant of left female breast   05/08/2013 Surgery left mastectomy and SLN biopsy, negative margins. Path showed 2 lesions 0.5cm an d1.4cm, ympT1cpN1aMx   10/17/2013 Mammogram There is persistent distortion in the left upper outer quadrant, corresponding to the screening mammographic finding.    10/26/2013 Breast US Ultrasound is performed, showing a hypoechoic irregular mass with surrounding distortion in the left breast 2 o'clock location 4 cm from the nipple measuring 1.1 x 0.9 x 0.7 cm. This corresponds to the mammographic finding. 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 - CALCIFICATIONS IDENTIFIED   10/30/2013 Receptors her2 tumor molecular markers: ER 100%, POSITIVE, PR: 42%, POSITIVE, Ki67: 53%; HER2 (+) (ration 2.8, copy number 3.5)   11/05/2013 Breast MRI Irregular enhancing mass measuring 5.1 cm in the upper and upper-outer quadrants of the left breast. The MR enhancing mass is much larger than the mass seen mammographically or sonographically.    11/05/2013 Breast MRI Suspicious level 1 left axillary lymph node concerning for metastatic involving. But [place] negative, no node biopsy.    11/07/2013  Initial Diagnosis Breast cancer of upper-outer quadrant of left female breast   11/26/2013 - 03/11/2014 Chemotherapy 6 cycles of neoadjuvant chemotherapy with TCH-P (Taxotere, Carbolatin, Herceptin and Perjeta followed by Neulasta   04/08/2014 -  Chemotherapy Herceptin maintenance    05/08/2014 Surgery Left mastectomy, margins are negative. Surgical past showed multifocal invasive ductal carcinoma (0.5 cm, 1.4 cm), 2/2 SLN were positive.   07/10/2014 - 08/23/2014 Radiation Therapy adjuvant radiation to Left chest wall/regional lymph nodes 5040 cGy in 28 sessions, left chest wall boost 1000 cGy in 5 sessions   08/22/2014 Procedure port removal. Tip culture (+) Pseudomonas.      CURRENT THERAPY: Herceptin maintenance therapy started on 04/08/2014, every 3 weeks, plan to finish in mid Aug 2016   INTERVAL HISTORY: Rachel [name] returns for follow up and her last Herceptin treatment. She is doing well overall. She denies any significant pain, except occasional moderate joint discomfort. She has good energy and appetite. She is very happy that she is finishing the Herceptin infusion today. She is very appreciative for her care at our cancer center in the past year.  REVIEW OF SYSTEMS:   Constitutional: Denies fevers, chills.  Eyes: Denies blurriness of vision Ears, nose, mouth, throat, and face: Denies mucositis or sore throat Respiratory: Denies cough, dyspnea or wheezes Cardiovascular: Denies palpitation, chest discomfort or lower extremity swelling Gastrointestinal:  Denies nausea, heartburn or change in bowel habits Skin: Denies abnormal skin rashes except a blister at the old port site Lymphatics: Denies new lymphadenopathy or easy bruising Neurological:Denies numbness, tingling or new  weaknesses Behavioral/Psych: Mood is stable, no new changes  All other systems were reviewed with the patient and are negative except those in history   MEDICAL HISTORY:  Past Medical History  Diagnosis Date  .  Hypertension   . Alopecia areata   . Status post chemotherapy 11/26/2013 - 03/11/2014    6 cycles of neoadjuvant chemotherapy with TCH-P (Taxotere, Carbolatin, Herceptin and Perjeta followed by Neulasta  . Hyperlipidemia   . Type 2 diabetes, diet controlled   . History of endometriosis   . Infection due to port-a-cath     left chest area w/ cellulitis and skin ulcer   . Breast cancer oncologist-  dr Krista Blue Desmond Szabo/  dx 07/ 2015--  DCIS left breast cT3, N0, Stage IIB  triple positive ,  ER+PR+HER2+/       s/p  chemotherapy 11-26-2013 to 03-11-2014 (after chemo, yT1cN1a)--  s/p  left mastectomy w/ SLN bx 05-08-2014/  radiation therapy currently started 07-10-2014  . Anemia due to chemotherapy   . 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 S/P radiation therapy 07/10/2014 through 08/23/2014     Left chest wall/regional lymph nodes 5040 cGy in 28 sessions, left chest wall boost 1000 cGy in 5 sessions     SURGICAL HISTORY: Past Surgical History  Procedure Laterality Date  . Colonoscopy    . Portacath placement N/A 11/20/2013    Procedure: INSERTION PORT-A-CATH;  Surgeon: [name], MD;  Location: [facility];  Service: General;  Laterality: N/A;  . Breast surgery Left 7/15    bx  . Mastectomy w/ sentinel node biopsy Left 05/08/2014    Procedure: LEFT MASTECTOMY WITH SENTINEL LYMPH NODE BIOPSY;  Surgeon: [name], MD;  Location: [facility];  Service: General;  Laterality: Left;  . Transthoracic echocardiogram  05-30-2014    grade I diastolic dysfunction/ ef 91-47%/  moderate diffuse calcification AV without stenosis/  trivial MR and TR  . Abdominal hysterectomy  1995    and 1996 Laparosopic Bilateral Salpingoophorectomy  . Port-a-cath removal Left 08/22/2014    Procedure: REMOVAL PORT-A-CATH;  Surgeon: [name], MD;  Location: [facility];  Service: General;  Laterality: Left;    I have reviewed  the social history and family history with the patient and they are unchanged from previous note.  ALLERGIES:  is allergic to eggs or egg-derived products; nickel; sulfa antibiotics; neosporin; multihance; penicillins; and tetanus toxoids.  MEDICATIONS:    Medication List       This list is accurate as of: 11/26/14  9:54 AM.  Always use your most recent med list.               acetaminophen 500 MG tablet  Commonly known as:  TYLENOL  Take 500 mg by mouth every 6 (six) hours as needed (pain).     anastrozole 1 MG tablet  Commonly known as:  ARIMIDEX  Take 1 tablet (1 mg total) by mouth daily.     calcium citrate-vitamin D 315-200 MG-UNIT per tablet  Commonly known as:  CITRACAL+D  Take 1 tablet by mouth daily.     Fish Oil 1000 MG Caps  Take 1 capsule by mouth daily.     Garlic 8295 MG Caps  Take 1 capsule by mouth daily.     HERCEPTIN IV  Inject 1 each into the vein See admin instructions. Every 3 weeks-unknown dose-last infusion 05/20/14     hydrochlorothiazide 25 MG tablet  Commonly known as:  HYDRODIURIL  Take 25 mg by mouth every morning.     multivitamin with minerals Tabs tablet  Take 1 tablet by mouth daily.     prochlorperazine 10 MG tablet  Commonly known as:  COMPAZINE  Take 10 mg by mouth every 6 (six) hours as needed for nausea or vomiting.         PHYSICAL EXAMINATION: ECOG PERFORMANCE STATUS: 0 BP 123/70 mmHg  Pulse 77  Temp(Src) 98.4 F (36.9 C) (Oral)  Resp 18  Ht _0  (1.651 m)  Wt 157 lb 8 oz (71.442 kg)  BMI 26.21 kg/m2  SpO2 100% GENERAL:alert, no distress and comfortable SKIN: skin color, texture, turgor are normal, no rashes or significant lesions except (+) small confluent folliculitis/abscess at the previous port site on left upper chest. (+) skin pigmentation at the left mastectomy site secondary to radiation. EYES: normal, Conjunctiva are pink and non-injected, sclera clear OROPHARYNX:no exudate, no erythema and lips, buccal  mucosa, and tongue normal  NECK: supple, thyroid normal size, non-tender, without nodularity LYMPH:  no palpable lymphadenopathy in the cervical, axillary or inguinal LUNGS: clear to auscultation and percussion with normal breathing effort HEART: regular rate & rhythm and no murmurs and no lower extremity edema ABDOMEN:abdomen soft, non-tender and normal bowel sounds Musculoskeletal:no cyanosis of digits and no clubbing  NEURO: alert & oriented x 3 with fluent speech, no focal motor/sensory deficits Breasts: Status post left mastectomy, surgical wound healing well, small scar at the previous port site.  (+) skin pigmentation secondary to radiation. Palpation of the right breasts and axilla revealed no obvious mass that I could appreciate. EXTREMITIES: no edema   LABORATORY DATA:  I have reviewed the data as listed CBC Latest Ref Rng 11/26/2014 11/06/2014 10/16/2014  WBC 3.9 - 10.3 10e3/uL 3.7(L) 3.5(L) 3.7(L)  Hemoglobin 11.6 - 15.9 g/dL 12.0 12.0 12.1  Hematocrit 34.8 - 46.6 % 36.5 36.3 37.0  Platelets 145 - 400 10e3/uL 145 179 180     CMP Latest Ref Rng 11/26/2014 11/06/2014 10/16/2014  Glucose 70 - 140 mg/dl 87 91 80  BUN 7.0 - 26.0 mg/dL 21.2 21.5 21.5  Creatinine 0.6 - 1.1 mg/dL 1.0 0.9 0.9  Sodium 136 - 145 mEq/L 147(H) 144 143  Potassium 3.5 - 5.1 mEq/L 3.9 4.5 3.7  Chloride 101 - 111 mmol/L - - -  CO2 22 - 29 mEq/L 28 32(H) 28  Calcium 8.4 - 10.4 mg/dL 9.5 9.7 9.7  Total Protein 6.4 - 8.3 g/dL 6.5 6.7 7.0  Total Bilirubin 0.20 - 1.20 mg/dL 0.63 0.53 0.52  Alkaline Phos 40 - 150 U/L 64 72 73  AST 5 - 34 U/L _1 ALT 0 - 55 U/L _2 PATHOLOGY REPORT: Diagnosis 1. Breast, simple mastectomy, left 05/08/2014 - PENDING STAINS. - MULTIFOCAL INVASIVE DUCTAL CARCINOMA WITH NEOADJUVANT RELATED CHANGES, SEE COMMENT. - NEGATIVE FOR LYMPH VASCULAR INVASION. - INVASIVE TUMOR IS 4 MM FROM NEAREST MARGIN (ANTERIOR). - DUCTAL CARCINOMA IN SITU - PREVIOUS BIOPSY SITE. - BENIGN  SKIN; NEGATIVE FOR TUMOR - SEE TUMOR SYNOPTIC TEMPLATE BELOW. 2. Lymph node, sentinel, biopsy, left axillary #1 - ONE LYMPH NODE, POSITIVE FOR METASTATIC MAMMARY CARCINOMA (1/1). - INTRANODAL TUMOR DEPOSIT IS 2 MM - NEGATIVE FOR EXTRACAPSULAR TUMOR EXTENSION. 3. Lymph node, sentinel, biopsy, left axillary #2 - ONE LYMPH NODE, POSITIVE FOR METASTATIC MAMMARY CARCINOMA (1/1). - INTRANODAL TUMOR DEPOSIT IS 4 MM - NEGATIVE FOR EXTRACAPSULAR TUMOR EXTENSION.  Microscopic Comment 1. BREAST, INVASIVE TUMOR, WITH LYMPH NODES  PRESENT Specimen, including laterality and lymph node sampling (sentinel, non-sentinel): Left breast Procedure: Simple mastectomy Histologic type: Ductal (x3 tumors), see comment. Grade: II of III (Tumor #1 and 2), see comment Tubule formation: 2 Nuclear pleomorphism: 3 Mitotic: 2 Grade: I of III (Tumor #3), see comment Tubule formation: 1 Nuclear pleomorphism: 1 Mitotic: 1 1 of 4 FINAL for ADAMS-FOUST, Kortney E ([id number]) Microscopic Comment(continued) Tumor size (glass slide measurement): 0.5 cm and 1.4 cm; gross measurement 2.0 cm, see comment. Margins: Invasive, distance to closest margin: 4 mm In-situ, distance to closest margin: 4 mm (anterior) If margin positive, focally or broadly: N/A Lymphovascular invasion: Absent Ductal carcinoma in situ: Present Grade: III of III Extensive intraductal component: Absent Lobular neoplasia: Pending stains Tumor focality: Multifocal Treatment effect: Present If present, treatment effect in breast tissue, lymph nodes or both: Tissue Extent of tumor: Skin: Negative Nipple: Negative Skeletal muscle: Negative Lymph nodes: Examined: 2 Sentinel 0 Non-sentinel 2 Total Lymph nodes with metastasis: 2 Isolated tumor cells (< 0.2 mm): 0 Micrometastasis: (> 0.2 mm and < 2.0 mm): 0 Macrometastasis: (> 2.0 mm): X 2 Extracapsular extension: Absent Breast prognostic profile: Estrogen receptor: Not repeated, previous study  demonstrated 100% positivity and 100% positivity ([id number] and [id number]) Progesterone receptor: Not repeated, previous study demonstrated 67%% and 42% positivity ([id number] and [id number]) Her 2 neu: Not repeated, previous study demonstrated amplification ([id number] and [id number]) Ki-67: Not repeated, previous study demonstrated 12% and 53% proliferation rate ([id number] and [id number]) Non-neoplastic breast: Previous biopsy site; fibrocystic change, usual ductal hyperplasia, calcifications and neoadjuvant related tissue changes. TNM: ympT1c, pN1a, pMX Comments: There are three different mass areas identified. The first central mass associated with barbell shaped clip demonstrates both in situ and invasive ductal carcinoma. Within the focus, the invasive ductal carcinoma spans 5 mm. The second posterior medial mass demonstrates both in situ and invasive ductal carcinoma spanning the entire 2.0 cm grossly identified stellate area. The third and final lateral mass with associated heart shaped clip demonstrates both in situ and invasive ductal carcinoma. The invasive tumor spans 1.4cm and is involved by and away from previous biopsy site. The lack of the myoepithelial layer was confirmed with smooth muscle myosin heavy chain, Calponin, and p63 immunostains (slide 1K).. The presence of strong diffuse E-Cadherin expression and absence of cytokeratin 5/6 expression supports a ductal phenotype to the in situ carcinoma (slide 1K). (CR:kh 05-09-14) Mali RUND DO Pathologist, Electronic Signature (Case signed 05/13/2014) Specimen Gross and Clinical Information  RADIOGRAPHIC STUDIES: I have personally reviewed the radiological images as listed and agreed with the findings in the report.  ECHO 11/12/2014  Study Conclusions  - Left ventricle: The cavity size was normal. Wall thickness was increased in a pattern of mild LVH. There was focal basal hypertrophy. Systolic function  was normal. The estimated ejection fraction was in the range of 60% to 65%. Wall motion was normal; there were no regional wall motion abnormalities. Doppler parameters are consistent with abnormal left ventricular relaxation (grade 1 diastolic dysfunction).   Mammogram 11/20/2014 IMPRESSION: No mammographic evidence of malignancy. A result letter of this screening mammogram will be mailed directly to the patient.  RECOMMENDATION: Screening mammogram in one year. ([id number])   ASSESSMENT & PLAN:  This is a very pleasant 59 years-old African [place] female from [facility] who presents with a left Invasive Ductal Carcinoma. This was picked on a mammogram and maximal diameter was 1.7 cm on Ultrasound however the MRI breast showed this mass to be large almost 5.1 cm  in maximal dimension making it a clinical T3 tumor. Also it showed a suspicious looking solitary lymph node in axilla but [place] was negative, not biopsied.  Clinical T3N0M0 Stage IIB.  The tumor biology indicates a LUMINAL-B type which means ER/PR/HER2 + tumor. Tumor is strongly ER+100%, PR 42%,KI-67 Index 53% showing a brisk mitotic rate and grade is called grade 2 (but grading as per pathologist cannot be relied upon on initial biopsy).   1. 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  -her restaging breast MRI results showed good partial response -I discussed her surgical pathology findings with her in details. Unfortunately she had metastasis to 2 Sentinel lymph nodes. Axillary lymph node dissection was discussed, Dr. Barry Dienes, Dr. Valere Dross and me and we recommend to proceed ALND, which is the current standard care. She however declined surgery due to the concern of lymphedema. She opted adjuvant radiation including the axillary area.  -I'll continue her Herceptin every 3  weeks to complete a total of one year treatment, through 11/2014, last dose in 3 weeks  -will repeat her echo in early Sep  -Premedication with solumedro, tylenol and Benadryl before Herceptin due to the reaction  -She is tolerating anastrozole well, we'll continue. Given her very high risk of recurrence, I would recommend 10 year anastrozole, based on recent clinical trial data (N Engl J Med 2016; 375:209-219).  -We discussed breast cancer surveillance, with mammogram once a year, close follow-up every 3-4 months for the next 2-3 years then every 6-12 months -We reviewed her recent mammogram results, which was normal.  -We discussed survivorship visit, I'll refer her  in a few months.  -Her recent repeat his echo was normal.   2. Bone health  - I encouraged her to continue taking calcium and vitamin D -We discussed the potential side effects of osteoporosis from anastrozole -Her last bone density scan was done on 11/19/2014, which showed normal bone density.    2. Left chest wall cellulitis and port infection with pseudomonas -resolved now   3. Anemia, secondary to chemotherapy -resolved now   4. Grade 1 peripheral neuropathy, and a mild intermittent ankle swollen, nail changes.  Likely secondary to chemotherapy (docetaxel) -improving. Continue observation     5. HTN, DM -Follow up with primary care physician  Plan -Last Herceptin treatment today -Refer to survivorship clinic in a few months  -I'll see her back in 3 months with lab   All questions were answered. The patient knows to call the clinic with any problems, questions or concerns. No barriers to learning was detected.  I spent 25 minutes counseling the patient face to face. The total time spent in the appointment was 30 minutes and more than 50% was on counseling and review of test results     Truitt Merle, MD 11/26/2014   9:54 AM

## 2014-11-26 NOTE — Patient Instructions (Signed)
[facility] Discharge Instructions for Patients Receiving Chemotherapy  Today you received the following chemotherapy agents herceptin  To help prevent nausea and vomiting after your treatment, we encourage you to take your nausea medication  If you develop nausea and vomiting that is not controlled by your nausea medication, call the clinic.   BELOW ARE SYMPTOMS THAT SHOULD BE REPORTED IMMEDIATELY:  *FEVER GREATER THAN 100.5 F  *CHILLS WITH OR WITHOUT FEVER  NAUSEA AND VOMITING THAT IS NOT CONTROLLED WITH YOUR NAUSEA MEDICATION  *UNUSUAL SHORTNESS OF BREATH  *UNUSUAL BRUISING OR BLEEDING  TENDERNESS IN MOUTH AND THROAT WITH OR WITHOUT PRESENCE OF ULCERS  *URINARY PROBLEMS  *BOWEL PROBLEMS  UNUSUAL RASH Items with * indicate a potential emergency and should be followed up as soon as possible.  Feel free to call the clinic you have any questions or concerns. The clinic phone number is [phone].  Please show the CHEMO ALERT CARD at check-in to the Emergency Department and triage nurse.   

## 2014-11-26 NOTE — Progress Notes (Signed)
Rachel [name] here for reassessment s/p XRT to her left chest wall.  She will complete Herceptin today.  She denies any pain and continues to have tanning in her prior tx field.  She also reports less fatigue.

## 2014-11-27 ENCOUNTER — Telehealth: Payer: Self-pay | Admitting: *Deleted

## 2014-11-27 ENCOUNTER — Telehealth: Payer: Self-pay | Admitting: Hematology

## 2014-11-27 NOTE — Telephone Encounter (Signed)
per pof to sch sch pt appt-scl & spoke to pt and adv pt of appt time-pt req that I mail copy of avs

## 2014-11-27 NOTE — Telephone Encounter (Signed)
Faxed orders for prosthetic bra to [place] [employer]. Fax     [phone].

## 2015-01-01 ENCOUNTER — Telehealth: Payer: Self-pay | Admitting: *Deleted

## 2015-01-01 NOTE — Telephone Encounter (Signed)
Discussed with Dr. Burr Medico.  Ok to wait and see Dr. Barry Dienes on 10/3.  However , please call us back if this worsens.  She verbalized understanding and appreciated the call back.

## 2015-01-01 NOTE — Telephone Encounter (Signed)
Patient called this am.  Last night she noticed a knot in her left armpit..  It is about the size of a pencil eraser.  It is slightly sore to touch, but otherwise not noticeable to patient. It is not red, she is afebrile.  She had a mastectomy in February 2016.  She denies any swelling in that arm or hand.  She sees Dr. Barry Dienes on October 3rd.  She would like to know if she needs to see Dr. Burr Medico before that?.  Her next scheduled visit with Dr. Burr Medico is 03-03-15.  Will discuss with Dr. Burr Medico and call her back at [phone].

## 2015-01-02 ENCOUNTER — Telehealth: Payer: Self-pay | Admitting: *Deleted

## 2015-01-02 NOTE — Telephone Encounter (Signed)
Pt called & reports that she called yest about a bump under her L arm.  She reports that she shaved under her arms before [facility] & she thinks that the bump was just an infected hair follicle.  She applied alcohol & it has shrunk.  She will observe & let us know if it gets worse.

## 2015-01-08 ENCOUNTER — Ambulatory Visit: Payer: Managed Care, Other (non HMO) | Attending: General Surgery | Admitting: Physical Therapy

## 2015-01-08 DIAGNOSIS — M25612 Stiffness of left shoulder, not elsewhere classified: Secondary | ICD-10-CM

## 2015-01-08 DIAGNOSIS — Z9189 Other specified personal risk factors, not elsewhere classified: Secondary | ICD-10-CM

## 2015-01-08 NOTE — Therapy (Signed)
[place], [place], [address] Phone: [phone]   Fax:  [phone]  Physical Therapy Evaluation  Patient Details  Name: Rachel [name] MRN: [id number] Date of Birth: 1956-03-12 Referring Provider:  [name], MD  Encounter Date: 01/08/2015      PT End of Session - 01/08/15 2247    Visit Number 1   Number of Visits 9   Date for PT Re-Evaluation 02/17/15   PT Start Time 6812   PT Stop Time 1705   PT Time Calculation (min) 52 min   Activity Tolerance Patient tolerated treatment well   Behavior During Therapy [facility] for tasks assessed/performed      Past Medical History  Diagnosis Date  . Hypertension   . Alopecia areata   . Status post chemotherapy 11/26/2013 - 03/11/2014    6 cycles of neoadjuvant chemotherapy with TCH-P (Taxotere, Carbolatin, Herceptin and Perjeta followed by Neulasta  . Hyperlipidemia   . Type 2 diabetes, diet controlled   . History of endometriosis   . Infection due to port-a-cath     left chest area w/ cellulitis and skin ulcer   . Breast cancer oncologist-  dr Krista Blue feng/  dx 07/ 2015--  DCIS left breast cT3, N0, Stage IIB  triple positive ,  ER+PR+HER2+/       s/p  chemotherapy 11-26-2013 to 03-11-2014 (after chemo, yT1cN1a)--  s/p  left mastectomy w/ SLN bx 05-08-2014/  radiation therapy currently started 07-10-2014  . Anemia due to chemotherapy   . 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 S/P radiation therapy 07/10/2014 through 08/23/2014     Left chest wall/regional lymph nodes 5040 cGy in 28 sessions, left chest wall boost 1000 cGy in 5 sessions     Past Surgical History  Procedure Laterality Date  . Colonoscopy    . Portacath placement N/A 11/20/2013    Procedure: INSERTION PORT-A-CATH;  Surgeon: [name], MD;  Location: [place];  Service: General;  Laterality: N/A;  .  Breast surgery Left 7/15    bx  . Mastectomy w/ sentinel node biopsy Left 05/08/2014    Procedure: LEFT MASTECTOMY WITH SENTINEL LYMPH NODE BIOPSY;  Surgeon: [name], MD;  Location: [place];  Service: General;  Laterality: Left;  . Transthoracic echocardiogram  05-30-2014    grade I diastolic dysfunction/ ef 75-17%/  moderate diffuse calcification AV without stenosis/  trivial MR and TR  . Abdominal hysterectomy  1995    and 1996 Laparosopic Bilateral Salpingoophorectomy  . Port-a-cath removal Left 08/22/2014    Procedure: REMOVAL PORT-A-CATH;  Surgeon: [name], MD;  Location: [place];  Service: General;  Laterality: Left;    There were no vitals filed for this visit.  Visit Diagnosis:  Stiffness of joint, shoulder region, left - Plan: PT plan of care cert/re-cert  At risk for lymphedema - Plan: PT plan of care cert/re-cert      Subjective Assessment - 01/08/15 1617    Subjective Saw Dr. Barry Dienes on Monday and she thought my range of motion needed work.   Pertinent History Diagnosed with breast cancer on left July 2015; had neo-adjuvant chemotherapy, then mastectomy 05/08/14 with two lymph nodes removed (had microscopic amount of cancer).  Had hematoma and developed cellulitis of chest from Portacath infection so had five rounds of antibiotics; got Portacath out 08/22/14.  Then had radiation x 6 weeks, completed 08/23/14, and it included left chest, axilla, and supraclavicular area.  Patient Stated Goals wants to be as healthy as possible   Currently in Pain? No/denies            [facility] PT Assessment - 01/08/15 0001    Assessment   Medical Diagnosis left breast cancer   Onset Date/Surgical Date 05/08/14   Precautions   Precautions Other (comment)   Precaution Comments cancer precautions   Restrictions   Weight Bearing Restrictions No   Balance Screen   Has the patient fallen in the past 6 months No   Has the patient had a decrease in activity  level because of a fear of falling?  No   Is the patient reluctant to leave their home because of a fear of falling?  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   Leisure walks an hour (3 miles) on a track 5 days a week;    Cognition   Overall Cognitive Status Within Functional Limits for tasks assessed   Observation/Other Assessments   Observations pleasant, well-looking woman   Quick DASH  -2.27  one question not answered (out of 11)   Posture/Postural Control   Posture/Postural Control Postural limitations   Postural Limitations Forward head   ROM / Strength   AROM / PROM / Strength AROM;Strength   AROM   AROM Assessment Site Shoulder   Right/Left Shoulder --   Right Shoulder Extension 67 Degrees   Right Shoulder Flexion 155 Degrees   Right Shoulder ABduction 172 Degrees   Right Shoulder Internal Rotation 70 Degrees   Right Shoulder External Rotation 90 Degrees   Left Shoulder Extension 60 Degrees   Left Shoulder Flexion 136 Degrees   Left Shoulder ABduction 111 Degrees   Left Shoulder Internal Rotation 79 Degrees   Left Shoulder External Rotation 80 Degrees   Strength   Overall Strength Comments both shoulders grossly 5/5           LYMPHEDEMA/ONCOLOGY QUESTIONNAIRE - 01/08/15 1653    Lymphedema Assessments   Lymphedema Assessments Upper extremities   Right Upper Extremity Lymphedema   10 cm Proximal to Olecranon Process 29.9 cm   Olecranon Process 24.9 cm   10 cm Proximal to Ulnar Styloid Process 21.3 cm   Just Proximal to Ulnar Styloid Process 16 cm   Across Hand at [employer] 20.1 cm   At [place] of 2nd Digit 6.5 cm   Left Upper Extremity Lymphedema   10 cm Proximal to Olecranon Process 29.8 cm   Olecranon Process 24.7 cm   10 cm Proximal to Ulnar Styloid Process 21.2 cm   Just Proximal to Ulnar Styloid Process 15.8 cm    Across Hand at [employer] 19.9 cm   At [place] of 2nd Digit 6.4 cm           Quick Dash - 01/08/15 0001    Open a tight or new jar No difficulty   Do heavy household chores (wash walls, wash floors) No difficulty   Carry a shopping bag or briefcase No difficulty   Wash your back No difficulty   Use a knife to cut food No difficulty   Recreational activities in which you take some force or impact through your arm, shoulder, or hand (golf, hammering, tennis) No difficulty   During the past week, to what extent has your arm, shoulder or hand problem interfered  with your normal social activities with family, friends, neighbors, or groups? Not at all   During the past week, to what extent has your arm, shoulder or hand problem limited your work or other regular daily activities Not at all   Tingling (pins and needles) in your arm, shoulder, or hand None   Difficulty Sleeping No difficulty   DASH Score -2.27 %                             Long Term Clinic Goals - 01/08/15 2253    CC Long Term Goal  #1   Title Patient will improve left shoulder active flexion to at least 150 degrees.   Baseline 136 on left compared to 155 on right.   Time 4   Period Weeks   Status New   CC Long Term Goal  #2   Title Patient will improve left shoulder active abduction to at least 165 degrees.   Baseline 155 on right compared to 172 on left   Time 4   Period Weeks   Status New   CC Long Term Goal  #3   Title left shoulder active ER to at least 90 degrees   Baseline 80 degrees left compared to 90 right.   Time 4   Period Weeks   Status New   CC Long Term Goal  #4   Title Patient will be independent with an HEP.   Time 4   Period Weeks   Status New   CC Long Term Goal  #5   Title Patient will be knowledgeable about lymphedema risk reduction practices.   Time 4   Period Weeks            Plan - 01/08/15 2248    Clinical Impression Statement This very pleasant woman  is 8 months out from her left mastectomy with ALND and approx. 5 months post XRT.  Her left shoulder AROM is functional but low compared to her right and low for this stage after surgery and radiation.  She will benefit from therapy to improve this and agrees that she wants to be the best that she can be.                           Pt will benefit from skilled therapeutic intervention in order to improve on the following deficits Decreased range of motion;Impaired UE functional use   Rehab Potential Excellent   PT Frequency 2x / week   PT Duration 4 weeks   PT Treatment/Interventions Passive range of motion;Manual techniques;Therapeutic exercise;Patient/family education;ADLs/Self Care Home Management   PT Next Visit Plan Begin P/AA/AROM for left shoulder along with HEP for same.   Recommended Other Services ABC class   Consulted and Agree with Plan of Care Patient         Problem List Patient Active Problem List   Diagnosis Date Noted  . Cellulitis 07/15/2014  . Hypersensitivity reaction 07/06/2014  . Breast cancer of upper-outer quadrant of left female breast ([place]) 11/07/2013    Lauralei Clouse 01/08/2015, 11:00 PM  [place], [place], [address] Phone: [phone]   Fax:  Carmi, PT 01/08/2015 11:00 PM

## 2015-01-09 ENCOUNTER — Ambulatory Visit: Payer: Managed Care, Other (non HMO)

## 2015-01-09 DIAGNOSIS — M25612 Stiffness of left shoulder, not elsewhere classified: Secondary | ICD-10-CM | POA: Diagnosis not present

## 2015-01-09 DIAGNOSIS — Z9189 Other specified personal risk factors, not elsewhere classified: Secondary | ICD-10-CM

## 2015-01-09 NOTE — Patient Instructions (Signed)
Flexion (Eccentric) - Active (Cane)    Lift cane with both hands with Lt hand higher than Rt. Avoid hiking shoulder. _5-10__ reps per set, hold for 5 seconds, _2__ sets per days. Then do out to Lt side as well. Can also do these stretches rolling a ball up the wall for same motions.  http://ecce.exer.us/155   Copyright  VHI. All rights reserved.

## 2015-01-09 NOTE — Therapy (Signed)
[place], [place], [address] Phone: [phone]   Fax:  [phone]  Physical Therapy Treatment  Patient Details  Name: Rachel [name] MRN: [id number] Date of Birth: 1955-05-11 Referring Provider:  [name], MD  Encounter Date: 01/09/2015      PT End of Session - 01/09/15 1206    Visit Number 2   Number of Visits 9   Date for PT Re-Evaluation 02/17/15   PT Start Time 1107   PT Stop Time 1155   PT Time Calculation (min) 48 min   Activity Tolerance Patient tolerated treatment well   Behavior During Therapy [facility] for tasks assessed/performed      Past Medical History  Diagnosis Date  . Hypertension   . Alopecia areata   . Status post chemotherapy 11/26/2013 - 03/11/2014    6 cycles of neoadjuvant chemotherapy with TCH-P (Taxotere, Carbolatin, Herceptin and Perjeta followed by Neulasta  . Hyperlipidemia   . Type 2 diabetes, diet controlled   . History of endometriosis   . Infection due to port-a-cath     left chest area w/ cellulitis and skin ulcer   . Breast cancer oncologist-  dr Krista Blue feng/  dx 07/ 2015--  DCIS left breast cT3, N0, Stage IIB  triple positive ,  ER+PR+HER2+/       s/p  chemotherapy 11-26-2013 to 03-11-2014 (after chemo, yT1cN1a)--  s/p  left mastectomy w/ SLN bx 05-08-2014/  radiation therapy currently started 07-10-2014  . Anemia due to chemotherapy   . 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 S/P radiation therapy 07/10/2014 through 08/23/2014     Left chest wall/regional lymph nodes 5040 cGy in 28 sessions, left chest wall boost 1000 cGy in 5 sessions     Past Surgical History  Procedure Laterality Date  . Colonoscopy    . Portacath placement N/A 11/20/2013    Procedure: INSERTION PORT-A-CATH;  Surgeon: [name], MD;  Location: [place];  Service: General;  Laterality: N/A;  .  Breast surgery Left 7/15    bx  . Mastectomy w/ sentinel node biopsy Left 05/08/2014    Procedure: LEFT MASTECTOMY WITH SENTINEL LYMPH NODE BIOPSY;  Surgeon: [name], MD;  Location: [place];  Service: General;  Laterality: Left;  . Transthoracic echocardiogram  05-30-2014    grade I diastolic dysfunction/ ef 85-27%/  moderate diffuse calcification AV without stenosis/  trivial MR and TR  . Abdominal hysterectomy  1995    and 1996 Laparosopic Bilateral Salpingoophorectomy  . Port-a-cath removal Left 08/22/2014    Procedure: REMOVAL PORT-A-CATH;  Surgeon: [name], MD;  Location: [place];  Service: General;  Laterality: Left;    There were no vitals filed for this visit.  Visit Diagnosis:  Stiffness of joint, shoulder region, left  At risk for lymphedema      Subjective Assessment - 01/09/15 1109    Subjective No complaints, just ready to get all my motion back.    Currently in Pain? No/denies               LYMPHEDEMA/ONCOLOGY QUESTIONNAIRE - 01/08/15 1653    Lymphedema Assessments   Lymphedema Assessments Upper extremities   Right Upper Extremity Lymphedema   10 cm Proximal to Olecranon Process 29.9 cm   Olecranon Process 24.9 cm   10 cm Proximal to Ulnar Styloid Process 21.3 cm   Just Proximal to Ulnar Styloid Process 16 cm   Across Hand at  Thumb Web Space 20.1 cm   At Base of 2nd Digit 6.5 cm   Left Upper Extremity Lymphedema   10 cm Proximal to Olecranon Process 29.8 cm   Olecranon Process 24.7 cm   10 cm Proximal to Ulnar Styloid Process 21.2 cm   Just Proximal to Ulnar Styloid Process 15.8 cm   Across Hand at Thumb Web Space 19.9 cm   At Base of 2nd Digit 6.4 cm                  [facility] Adult PT Treatment/Exercise - 01/09/15 0001    Shoulder Exercises: Pulleys   Flexion 2 minutes   ABduction 2 minutes   Shoulder Exercises: Therapy Ball   Flexion 10 reps   ABduction 5 reps   Shoulder Exercises: Stretch   Corner Stretch 4  reps;10 seconds  In doorway   Other Shoulder Stretches Dowel rod flexion and abduction 5 reps 5 seconds                PT Education - 01/09/15 1203    Education provided Yes   Education Details Cane exercises and instructed pt in lymphedema risk reductions and infection prevention   Person(s) Educated Patient   Methods Explanation;Demonstration;Handout   Comprehension Verbalized understanding;Returned demonstration                Long Term Clinic Goals - 01/08/15 2253    CC Long Term Goal  #1   Title Patient will improve left shoulder active flexion to at least 150 degrees.   Baseline 136 on left compared to 155 on right.   Time 4   Period Weeks   Status New   CC Long Term Goal  #2   Title Patient will improve left shoulder active abduction to at least 165 degrees.   Baseline 155 on right compared to 172 on left   Time 4   Period Weeks   Status New   CC Long Term Goal  #3   Title left shoulder active ER to at least 90 degrees   Baseline 80 degrees left compared to 90 right.   Time 4   Period Weeks   Status New   CC Long Term Goal  #4   Title Patient will be independent with an HEP.   Time 4   Period Weeks   Status New   CC Long Term Goal  #5   Title Patient will be knowledgeable about lymphedema risk reduction practices.   Time 4   Period Weeks            Plan - 01/09/15 1206    Clinical Impression Statement Pt tolerated treatement very well today and was very recepetive to learning new exercises and lymphedema instruction. After discussing lymphedema risk reductions pt is also interested in getting a class I sleeve to wear when being more active.    Pt will benefit from skilled therapeutic intervention in order to improve on the following deficits Decreased range of motion;Impaired UE functional use   Rehab Potential Excellent   PT Frequency 2x / week   PT Duration 4 weeks   PT Treatment/Interventions Passive range of motion;Manual  techniques;Therapeutic exercise;Patient/family education;ADLs/Self Care Home Management   PT Next Visit Plan Cont P/AA/AROM for left shoulder along with HEP for same, review HEP prn and progress.    PT Home Exercise Plan Cane exercises and ball roll up wall for end range stretch   Recommended Other Services Sent demographics to [facility]   for pt to get a class I off the shelf garment.    Consulted and Agree with Plan of Care Patient        Problem List Patient Active Problem List   Diagnosis Date Noted  . Cellulitis 07/15/2014  . Hypersensitivity reaction 07/06/2014  . Breast cancer of upper-outer quadrant of left female breast ([facility]) 11/07/2013    Rosenberger, Valerie Ann, PTA 01/09/2015, 12:10 PM  [facility] [address] [place], [place], [address] Phone: [phone]   Fax:  [phone]      

## 2015-01-14 ENCOUNTER — Ambulatory Visit: Payer: Managed Care, Other (non HMO)

## 2015-01-14 DIAGNOSIS — Z9189 Other specified personal risk factors, not elsewhere classified: Secondary | ICD-10-CM

## 2015-01-14 DIAGNOSIS — M25612 Stiffness of left shoulder, not elsewhere classified: Secondary | ICD-10-CM

## 2015-01-14 NOTE — Therapy (Signed)
[place], [place], [address] Phone: [phone]   Fax:  [phone]  Physical Therapy Treatment  Patient Details  Name: Rachel [name] MRN: [id number] Date of Birth: 08/22/1955 Referring Provider:  [name], MD  Encounter Date: 01/14/2015      PT End of Session - 01/14/15 0954    Visit Number 3   Number of Visits 9   Date for PT Re-Evaluation 02/17/15   PT Start Time 0937   PT Stop Time 1018   PT Time Calculation (min) 41 min   Activity Tolerance Patient tolerated treatment well   Behavior During Therapy [facility] for tasks assessed/performed      Past Medical History  Diagnosis Date  . Hypertension   . Alopecia areata   . Status post chemotherapy 11/26/2013 - 03/11/2014    6 cycles of neoadjuvant chemotherapy with TCH-P (Taxotere, Carbolatin, Herceptin and Perjeta followed by Neulasta  . Hyperlipidemia   . Type 2 diabetes, diet controlled   . History of endometriosis   . Infection due to port-a-cath     left chest area w/ cellulitis and skin ulcer   . Breast cancer oncologist-  dr Krista Blue feng/  dx 07/ 2015--  DCIS left breast cT3, N0, Stage IIB  triple positive ,  ER+PR+HER2+/       s/p  chemotherapy 11-26-2013 to 03-11-2014 (after chemo, yT1cN1a)--  s/p  left mastectomy w/ SLN bx 05-08-2014/  radiation therapy currently started 07-10-2014  . Anemia due to chemotherapy   . 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 S/P radiation therapy 07/10/2014 through 08/23/2014     Left chest wall/regional lymph nodes 5040 cGy in 28 sessions, left chest wall boost 1000 cGy in 5 sessions     Past Surgical History  Procedure Laterality Date  . Colonoscopy    . Portacath placement N/A 11/20/2013    Procedure: INSERTION PORT-A-CATH;  Surgeon: [name], MD;  Location: [place];  Service: General;  Laterality: N/A;  .  Breast surgery Left 7/15    bx  . Mastectomy w/ sentinel node biopsy Left 05/08/2014    Procedure: LEFT MASTECTOMY WITH SENTINEL LYMPH NODE BIOPSY;  Surgeon: [name], MD;  Location: [place];  Service: General;  Laterality: Left;  . Transthoracic echocardiogram  05-30-2014    grade I diastolic dysfunction/ ef 43-15%/  moderate diffuse calcification AV without stenosis/  trivial MR and TR  . Abdominal hysterectomy  1995    and 1996 Laparosopic Bilateral Salpingoophorectomy  . Port-a-cath removal Left 08/22/2014    Procedure: REMOVAL PORT-A-CATH;  Surgeon: [name], MD;  Location: [place];  Service: General;  Laterality: Left;    There were no vitals filed for this visit.  Visit Diagnosis:  Stiffness of joint, shoulder region, left  At risk for lymphedema      Subjective Assessment - 01/14/15 0940    Subjective Been doing the exercises and those are going well.    Currently in Pain? No/denies            [facility] PT Assessment - 01/14/15 0001    AROM   Left Shoulder Flexion 156 Degrees   Left Shoulder ABduction 155 Degrees                     [facility] Adult PT Treatment/Exercise - 01/14/15 0001    Shoulder Exercises: Pulleys   Flexion 2 minutes   ABduction 2 minutes  Shoulder Exercises: Therapy Ball   Flexion 10 reps   ABduction 5 reps   Shoulder Exercises: ROM/Strengthening   "W" Arms Standing against wall 5 reps for 5 seconds   Shoulder Exercises: Stretch   Corner Stretch 3 reps;10 seconds  In doorway   Manual Therapy   Passive ROM Supine for Lt shoulder flexion, abduction, and D2 all to pts tolerance                        Long Term Clinic Goals - 01/14/15 0955    CC Long Term Goal  #1   Title Patient will improve left shoulder active flexion to at least 150 degrees.  156 degrees attained today 01/14/15   Status Achieved   CC Long Term Goal  #2   Title Patient will improve left shoulder active abduction to at  least 165 degrees.  155 degrees attained today 01/14/15   Status On-going   CC Long Term Goal  #3   Title left shoulder active ER to at least 90 degrees  Full ROM    Status Achieved   CC Long Term Goal  #4   Title Patient will be independent with an HEP.   Status On-going   CC Long Term Goal  #5   Title Patient will be knowledgeable about lymphedema risk reduction practices.   Status Achieved            Plan - 01/14/15 0952    Clinical Impression Statement Pt continues to do very well with hterapy already making great progress with her ROM meeting the flexion goal. She is compliant and doing well with her HEP as well.    Pt will benefit from skilled therapeutic intervention in order to improve on the following deficits Decreased range of motion;Impaired UE functional use   Rehab Potential Excellent   PT Frequency 2x / week   PT Duration 4 weeks   PT Treatment/Interventions Passive range of motion;Manual techniques;Therapeutic exercise;Patient/family education;ADLs/Self Care Home Management   PT Next Visit Plan Cont P/AA/AROM for left shoulder along with HEP for same, review HEP prn and progress.    Consulted and Agree with Plan of Care Patient        Problem List Patient Active Problem List   Diagnosis Date Noted  . Cellulitis 07/15/2014  . Hypersensitivity reaction 07/06/2014  . Breast cancer of upper-outer quadrant of left female breast ([place]) 11/07/2013    Otelia Limes, PTA 01/14/2015, 10:23 AM  [place], [place], [address] Phone: [phone]   Fax:  [phone]

## 2015-01-16 ENCOUNTER — Encounter: Payer: Self-pay | Admitting: Nurse Practitioner

## 2015-01-16 ENCOUNTER — Ambulatory Visit (HOSPITAL_BASED_OUTPATIENT_CLINIC_OR_DEPARTMENT_OTHER): Payer: Managed Care, Other (non HMO) | Admitting: Nurse Practitioner

## 2015-01-16 ENCOUNTER — Ambulatory Visit: Payer: Managed Care, Other (non HMO)

## 2015-01-16 VITALS — BP 129/70 | HR 67 | Temp 97.8°F | Resp 18 | Ht 65.0 in | Wt 158.2 lb

## 2015-01-16 DIAGNOSIS — C50412 Malignant neoplasm of upper-outer quadrant of left female breast: Secondary | ICD-10-CM | POA: Diagnosis not present

## 2015-01-16 DIAGNOSIS — Z9189 Other specified personal risk factors, not elsewhere classified: Secondary | ICD-10-CM

## 2015-01-16 DIAGNOSIS — M25612 Stiffness of left shoulder, not elsewhere classified: Secondary | ICD-10-CM | POA: Diagnosis not present

## 2015-01-16 DIAGNOSIS — Z79811 Long term (current) use of aromatase inhibitors: Secondary | ICD-10-CM

## 2015-01-16 DIAGNOSIS — Z170 Estrogen receptor positive status [ER+]: Secondary | ICD-10-CM

## 2015-01-16 NOTE — Patient Instructions (Signed)
Over Head Pull: Narrow Grip And Wide Grip    Cancer Rehab [phone]   On back, knees bent, feet flat, band across thighs, elbows straight but relaxed. Pull hands apart (start). Keeping elbows straight, bring arms up and over head, hands toward floor. Keep pull steady on band. Hold momentarily. Return slowly, keeping pull steady, back to start.  Repeat _10__ times. Band color _red_____  Then do 10 reps with a wider grip on the band.  Side Pull: Double Arm   On back, knees bent, feet flat. Arms perpendicular to body, shoulder level, elbows straight but relaxed. Pull arms out to sides, elbows straight. Resistance band comes across collarbones, hands toward floor. Hold momentarily. Slowly return to starting position. Repeat _10__ times. Band color _red____   Sword   On back, knees bent, feet flat, left hand on left hip, right hand above left. Pull right arm DIAGONALLY (hip to shoulder) across chest. Bring right arm along head toward floor. Hold momentarily. Slowly return to starting position. Repeat _10__ times. Do with left arm. Band color __red____   Shoulder Rotation: Double Arm   On back, knees bent, feet flat, elbows tucked at sides, bent 90, hands palms up. Pull hands apart and down toward floor, keeping elbows near sides. Hold momentarily. Slowly return to starting position. Repeat __10_ times. Band color ___red___

## 2015-01-16 NOTE — Therapy (Signed)
[place], [place], [address] Phone: [phone]   Fax:  [phone]  Physical Therapy Treatment  Patient Details  Name: Rachel [name] MRN: [id number] Date of Birth: [birth year] Referring Provider:  [name], MD  Encounter Date: 01/16/2015      PT End of Session - 01/16/15 0912    Visit Number 4   Number of Visits 9   Date for PT Re-Evaluation 02/17/15   PT Start Time 0804   PT Stop Time 0902   PT Time Calculation (min) 58 min   Activity Tolerance Patient tolerated treatment well   Behavior During Therapy [facility] for tasks assessed/performed      Past Medical History  Diagnosis Date  . Hypertension   . Alopecia areata   . Status post chemotherapy 11/26/2013 - 03/11/2014    6 cycles of neoadjuvant chemotherapy with TCH-P (Taxotere, Carbolatin, Herceptin and Perjeta followed by Neulasta  . Hyperlipidemia   . Type 2 diabetes, diet controlled   . History of endometriosis   . Infection due to port-a-cath     left chest area w/ cellulitis and skin ulcer   . Breast cancer oncologist-  dr Krista Blue feng/  dx 07/ 2015--  DCIS left breast cT3, N0, Stage IIB  triple positive ,  ER+PR+HER2+/       s/p  chemotherapy 11-26-2013 to 03-11-2014 (after chemo, yT1cN1a)--  s/p  left mastectomy w/ SLN bx 05-08-2014/  radiation therapy currently started 07-10-2014  . Anemia due to chemotherapy   . 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 S/P radiation therapy 07/10/2014 through 08/23/2014     Left chest wall/regional lymph nodes 5040 cGy in 28 sessions, left chest wall boost 1000 cGy in 5 sessions     Past Surgical History  Procedure Laterality Date  . Colonoscopy    . Portacath placement N/A 11/20/2013    Procedure: INSERTION PORT-A-CATH;  Surgeon: [name], MD;  Location: [place];  Service: General;  Laterality: N/A;  .  Breast surgery Left 7/15    bx  . Mastectomy w/ sentinel node biopsy Left 05/08/2014    Procedure: LEFT MASTECTOMY WITH SENTINEL LYMPH NODE BIOPSY;  Surgeon: [name], MD;  Location: [place];  Service: General;  Laterality: Left;  . Transthoracic echocardiogram  05-30-2014    grade I diastolic dysfunction/ ef 38-45%/  moderate diffuse calcification AV without stenosis/  trivial MR and TR  . Abdominal hysterectomy  1995    and 1996 Laparosopic Bilateral Salpingoophorectomy  . Port-a-cath removal Left 08/22/2014    Procedure: REMOVAL PORT-A-CATH;  Surgeon: [name], MD;  Location: [place];  Service: General;  Laterality: Left;    There were no vitals filed for this visit.  Visit Diagnosis:  Stiffness of joint, shoulder region, left  At risk for lymphedema      Subjective Assessment - 01/16/15 0808    Subjective Felt really good after last visit, very relaxed. I didn't want the treatment to end!   Currently in Pain? No/denies                         [facility] Adult PT Treatment/Exercise - 01/16/15 0001    Shoulder Exercises: Pulleys   Flexion 2 minutes   ABduction 2 minutes   Shoulder Exercises: Therapy Ball   Flexion 10 reps   ABduction 5 reps   Shoulder Exercises: ROM/Strengthening   "W" Arms Standing against  wall 5 reps for 5 seconds   Other ROM/Strengthening Exercises Supine Scapular Series with Red theraband 10 reps of each, pt returned excellent demo.    Shoulder Exercises: Stretch   Corner Stretch 3 reps;10 seconds  In doorway   Other Shoulder Stretches Standing modifiied downward dog on the wall 5 reps, 5 seconds.   Manual Therapy   Myofascial Release To Lt chest wall throughout PROM and UE pulling   Passive ROM Supine for Lt shoulder flexion, abduction, and D2 all to pts tolerance                PT Education - 01/16/15 0906    Education provided Yes   Education Details Supine scapular series with red theraband    Person(s) Educated Patient   Methods Explanation;Demonstration;Handout   Comprehension Verbalized understanding;Returned demonstration                [place] - 01/14/15 0955    CC Long Term Goal  #1   Title Patient will improve left shoulder active flexion to at least 150 degrees.  156 degrees attained today 01/14/15   Status Achieved   CC Long Term Goal  #2   Title Patient will improve left shoulder active abduction to at least 165 degrees.  155 degrees attained today 01/14/15   Status On-going   CC Long Term Goal  #3   Title left shoulder active ER to at least 90 degrees  Full ROM    Status Achieved   CC Long Term Goal  #4   Title Patient will be independent with an HEP.   Status On-going   CC Long Term Goal  #5   Title Patient will be knowledgeable about lymphedema risk reduction practices.   Status Achieved            Plan - 01/16/15 0912    Clinical Impression Statement Progressed pts HEP today to incorporate gentle supine scapular strength which she did very well with demonstrating good, correct technique. Also instructed pt in self myofascial release and scar massage to help continue progressing her healing and decreasing tightness at Lt chest wall.    Pt will benefit from skilled therapeutic intervention in order to improve on the following deficits Decreased range of motion;Impaired UE functional use   Rehab Potential Excellent   PT Frequency 2x / week   PT Duration 4 weeks   PT Treatment/Interventions Passive range of motion;Manual techniques;Therapeutic exercise;Patient/family education;ADLs/Self Care Home Management   PT Next Visit Plan Cont P/AA/AROM for left shoulder along with HEP for same, review HEP prn and progress.    PT Home Exercise Plan Supine scapular series   Consulted and Agree with Plan of Care Patient        Problem List Patient Active Problem List   Diagnosis Date Noted  . Cellulitis 07/15/2014  . Hypersensitivity  reaction 07/06/2014  . Breast cancer of upper-outer quadrant of left female breast ([place]) 11/07/2013    Otelia Limes, PTA 01/16/2015, 9:19 AM  [place], [place], [address] Phone: [phone]   Fax:  [phone]

## 2015-01-16 NOTE — Progress Notes (Signed)
[name]:  Cancer Survivorship   REASON FOR VISIT:  Routine follow-up post-treatment for a recent history of breast cancer.  BRIEF ONCOLOGIC HISTORY:  Oncology History   Breast cancer of upper-outer quadrant of left female breast   Staging form: Breast, AJCC 7th Edition     Clinical: Stage IIB (T3, N0, cM0) - Unsigned       Staging comments: Staged at breast conference 8.5.15      Pathologic stage from 05/08/2014: Stage IIA (yT1c(m), N1a, cM0) - Signed by Seward Grater, MD on 05/15/2014       Staging comments: Staged on final mastectomy specimen by Dr. Donato Heinz        Breast cancer of upper-outer quadrant of left female breast ([place])   10/17/2013 Mammogram Left breast: persistent distortion in the UOQ, corresponding to the screening mammographic finding.    10/26/2013 Breast US Left breast: hypoechoic irregular mass with surrounding distortion in the left breast 2 o'clock location 4 cm from the nipple measuring 1.1 x 0.9 x 0.7 cm. This corresponds to the mammographic finding. No left axillary LAD   10/30/2013 Initial Biopsy Left breast core needle bx: Invasive ductal carcinoma, grade 2, ER+ (100%), PR+ (42%), HER2neu positive (ratio 2.8), Ki67 53%, DCIS with calcifications   11/05/2013 Breast MRI Irregular enhancing mass measuring 5.1 cm in the upper and upper-outer quadrants of the left breast. The MR enhancing mass is much larger than the mass seen mammographically or sonographically.    11/05/2013 Breast MRI Suspicious level 1 left axillary lymph node concerning for metastatic involving. But [place] negative, no node biopsy.    11/07/2013 Clinical Stage Stage IIB: T2 N0 cM0   11/26/2013 - 11/26/2014 Neo-Adjuvant Chemotherapy docetaxel, carboplatin, pertuzumab, and transtuzumab x 6 cycles (11/26/13-03/11/14) followed by mainteance trastuzumab (04/08/14-11/26/14)  to complete 1 year of [name]   05/08/2014 Surgery Left mastectomy/SLNB Barry Dienes):  Multifocal invasive ductal carcinoma (0.5 cm, 1.4 cm), DCIS, 2/2 SLN  were positive.   05/08/2014 Pathologic Stage Stage IIA: ympT1c pN1a    07/10/2014 - 08/23/2014 Radiation [name] Adjuvant RT Valere Dross): Left chest wall/regional lymph nodes 50.4 Gy over 28 fractions; left chest wall boost 10 Gy over 5 fractions   08/22/2014 Procedure port removal. Tip culture (+) Pseudomonas.    09/2014 -  Anti-estrogen oral [name] Anastrozole 1 mg daily.  Planned duration of [name] 10 years Burr Medico).    INTERVAL HISTORY:  Rachel [name] to the [name] today for our initial meeting to review her survivorship care plan detailing her treatment course for breast cancer, as [name] as monitoring long-term side effects of that treatment, education regarding health maintenance, screening, and overall wellness and health promotion.     Rachel [name] approximately four and a half months ago.  She denies fatigue, headaches, cough, shortness of breath, or weight loss. Her last trastuzumab was mid August and her ECHO performed 11/12/2014 showed an EF of 60-65% with doppler parameters consistent with abnormal left ventricular relaxation. She began her anastrozole in June 2016 and is having vaginal dryness. She does have pain with intercourse. She denies any hot flashes or joint pain. Denies any change in her right breast, specifically any mass or nodularity, and she denies any change along her left mastectomy incision. She underwent mammography in August which was unremarkable. She is being seen in physical [name] for stiffness of her left shoulder and undergoing [name] at this time.  She is scheduled to see Dr. Burr Medico again  in November 2016. She reports that she did have her DEXA scan done in August  REVIEW OF SYSTEMS:  General: Denies fever, chills, or night sweats. Cardiac: Denies palpitations, chest pain, and lower extremity edema.  Respiratory: Denies dyspnea on exertion.  GI: Denies abdominal pain,  constipation, diarrhea, nausea, or vomiting.  GU: Denies dysuria, hematuria, vaginal bleeding, vaginal discharge, or vaginal dryness.  Musculoskeletal: Denies joint or bone pain.  Neuro: Denies headache or recent falls. Denies peripheral neuropathy. Skin: Denies rash, pruritis, or open wounds.  Breast: Denies any new nodularity, masses, tenderness, nipple changes, or nipple discharge in right breast.  Psych: Denies depression, anxiety, insomnia, or memory loss.   A 14-point review of systems was completed and was negative, except as noted above.   ONCOLOGY TREATMENT TEAM:  1. Surgeon:  Dr. Barry Dienes at [facility]  2. Medical Oncologist: Dr. Burr Medico 3. Radiation Oncologist: Dr. Valere Dross    PAST MEDICAL/SURGICAL HISTORY:  Past Medical History  Diagnosis Date  . Hypertension   . Alopecia areata   . Status post chemotherapy 11/26/2013 - 03/11/2014    6 cycles of neoadjuvant chemotherapy with TCH-P (Taxotere, Carbolatin, Herceptin and Perjeta followed by Neulasta  . Hyperlipidemia   . Type 2 diabetes, diet controlled ([place])   . History of endometriosis   . Infection due to port-a-cath     left chest area w/ cellulitis and skin ulcer   . Breast cancer [facility]) oncologist-  dr Krista Blue feng/  dx 07/ 2015--  DCIS left breast cT3, N0, Stage IIB  triple positive ,  ER+PR+HER2+/       s/p  chemotherapy 11-26-2013 to 03-11-2014 (after chemo, yT1cN1a)--  s/p  left mastectomy w/ SLN bx 05-08-2014/  radiation [name] currently started 07-10-2014  . Anemia due to chemotherapy   . Peripheral neuropathy due to chemotherapy ([place])     hands  . S/P radiation [name] 07/10/2014 through 08/23/2014     Left chest wall/regional lymph nodes 5040 cGy in 28 sessions, left chest wall boost 1000 cGy in 5 sessions    Past Surgical History  Procedure Laterality Date  . Colonoscopy    . Portacath placement N/A 11/20/2013    Procedure:  INSERTION PORT-A-CATH;  Surgeon: [name], MD;  Location: [place];  Service: General;  Laterality: N/A;  . Breast surgery Left 7/15    bx  . Mastectomy w/ sentinel node biopsy Left 05/08/2014    Procedure: LEFT MASTECTOMY WITH SENTINEL LYMPH NODE BIOPSY;  Surgeon: [name], MD;  Location: [place];  Service: General;  Laterality: Left;  . Transthoracic echocardiogram  05-30-2014    grade I diastolic dysfunction/ ef 56-21%/  moderate diffuse calcification AV without stenosis/  trivial MR and TR  . Abdominal hysterectomy  1995    and 1996 Laparosopic Bilateral Salpingoophorectomy  . Port-a-cath removal Left 08/22/2014    Procedure: REMOVAL PORT-A-CATH;  Surgeon: [name], MD;  Location: [place];  Service: General;  Laterality: Left;     ALLERGIES:  Allergies  Allergen Reactions  . Eggs Or Egg-Derived Products Nausea Only    "has never had a flu shot"  . Nickel Hives  . Sulfa Antibiotics Swelling  . Neosporin [Neomycin-Bacitracin Zn-Polymyx] Swelling    Swelling of area, redness   Contains sulfur  . Multihance [Gadobenate] Nausea And Vomiting    Contrast dye  . Penicillins Rash  . Tetanus Toxoids Swelling and Rash     CURRENT MEDICATIONS:  Current Outpatient Prescriptions on File Prior to Visit  Medication Sig Dispense Refill  . anastrozole (ARIMIDEX) 1 MG tablet Take 1 tablet (1 mg total) by mouth daily. 90 tablet 3  . calcium citrate-vitamin D (CITRACAL+D) 315-200 MG-UNIT per tablet Take 1 tablet by mouth daily.    . Garlic 9675 MG CAPS Take 1 capsule by mouth daily.    . hydrochlorothiazide (HYDRODIURIL) 25 MG tablet Take 25 mg by mouth every morning.     . Multiple Vitamin (MULTIVITAMIN WITH MINERALS) TABS tablet Take 1 tablet by mouth daily.    . Omega-3 Fatty Acids (FISH OIL) 1000 MG CAPS Take 1 capsule by mouth daily.    Marland Kitchen acetaminophen (TYLENOL) 500 MG tablet Take 500 mg by mouth every 6 (six) hours as needed (pain).     No current  facility-administered medications on file prior to visit.     ONCOLOGIC FAMILY HISTORY:  Family History  Problem Relation Age of Onset  . Diabetes Mother   . Coronary artery disease Mother   . Diabetes Father   . Diabetes Sister     gestational  . Diabetes Brother   . Diabetes Brother   . Diabetes Sister      GENETIC COUNSELING/TESTING: None   SOCIAL HISTORY:  Rachel [name] is married and lives with her spouse in [place], [place].  She has one daughter children who lives out of state.  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  She denies any current or history of tobacco, alcohol, or illicit drug use.     PHYSICAL EXAMINATION:  Vital Signs:   Filed Vitals:   01/16/15 1017  BP: 129/70  Pulse: 67  Temp: 97.8 F (36.6 C)  Resp: 18   ECOG Performance status: 0 General: [name]-nourished, [name]-appearing female in no acute distress.  She is unaccompanied in [name] today.   HEENT: Head is atraumatic and normocephalic. Hair is beginning to regrow following chemotherapy. Pupils equal and reactive to light and accomodation. Conjunctivae clear without exudate.  Sclerae anicteric. Oral mucosa is pink, moist, and intact without lesions.  Oropharynx is pink without lesions or erythema.  Lymph: No cervical, supraclavicular, infraclavicular, or axillary lymphadenopathy noted on palpation.  Cardiovascular: Regular rate and rhythm without murmurs, rubs, or gallops. Respiratory: Clear to auscultation bilaterally. Chest expansion symmetric without accessory muscle use on inspiration or expiration.  GI: Abdomen soft and round. No tenderness to palpation. Bowel sounds normoactive in 4 quadrants.  GU: Deferred.  Musculoskeletal: Muscle strength 5/5 in all extremities.  Neuro: No focal deficits. Steady gait.  Psych: Mood and affect normal and appropriate for situation.  Extremities: No edema, cyanosis, or clubbing.  Skin: Warm and dry. No open lesions noted.    LABORATORY DATA:  None for this visit.  DIAGNOSTIC IMAGING:  None for this visit.     ASSESSMENT AND PLAN:   1. 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 She will follow-up with her medical oncologist,  Dr. Burr Medico, in November 2016 with history and physical exam per surveillance protocol.  She will continue her anti-estrogen [name] with anastrozole as prescribed by Dr. Burr Medico.  A comprehensive survivorship care plan and treatment summary was reviewed  with the patient today detailing her breast cancer diagnosis, treatment course, potential late/long-term effects of treatment, appropriate follow-up care with recommendations for the future, and patient education resources.  A copy of this summary, along with a letter will be sent to the patient's primary care provider via in basket message after today's visit.  Rachel [name] is welcome to return to the Survivorship [name] in the future, as needed; no follow-up will be scheduled at this time.    2. Dyspareunia: I believe Rachel [name] to be related to the anastrozole. We discussed various interventions including vaginal moisturizers and lubricants. I recommended to her that she begin the use of a vaginal moisturizer 2-3 times a week for maintenance and the use of lubricants with sexual intercourse. We discussed the potential of dilators in the event lubricants and moisturizers are not enough. I advised her to not begin the use of any estrogen [name] (creams and / or oral [name]) based on her hormone positive disease  and being on anastrozole [name].  I also informed her of the "Healthy Pelvic Floor" class was held monthly here at the cancer center and she shard that she is registered for that. We may consider future referral to the pelvic floor physical therapist in the event she does not obtain relief from the use of moisturizers and lubricants.  3. Bone health:  Given Rachel [name] of breast cancer and her current treatment regimen including anti-estrogen [name] with anastrozole, she is at risk for bone demineralization.  Her last bone density exam was in August and we will monitor this every 1-2 years.  She was encouraged to increase her consumption of foods rich in calcium and vitamin D as [name] as to increase her weight-bearing activities.  She was given education on specific activities to promote bone health.  4. Cancer screening:  Due to Rachel [name]'s history and her age, she should receive screening for skin cancers and colon cancer.  The information and recommendations are listed on the patient's comprehensive care plan/treatment summary and were reviewed in detail with the patient.    5. Health maintenance and wellness promotion: Ms. Bright was encouraged to consume 5-7 servings of fruits and vegetables per day. We reviewed the "Nutrition Rainbow" handout, as [name] as the handout about "Nutrition for Breast Cancer Survivors."  She was also encouraged to engage in moderate to vigorous exercise for 30 minutes per day most days of the week. We discussed the [employer] fitness program, which is designed for cancer survivors to help them become more physically fit after cancer treatments.  She was instructed to limit her alcohol consumption and continue to abstain from tobacco use.   6. Support services/counseling: It is not uncommon for this period of the patient's cancer care trajectory to be one of many emotions and stressors.  We discussed an opportunity for her to participate in  the next session of [facility] ("Finding Your New Normal") support group series designed for patients after they have completed treatment.   Ms. Schrag was encouraged to take advantage of our many other support services programs, support groups, and/or counseling in coping with her new life as a cancer survivor after completing anti-cancer treatment.  She was offered support today through active listening and expressive supportive counseling.  She was given information regarding our available services and encouraged to contact me with any questions or for help enrolling in any of our support group/programs.    A total of 50 minutes of face-to-face time was  spent with this patient with greater than 50% of that time in counseling and care-coordination.   Sylvan Cheese, NP  Survivorship Program [facility] [phone]   Note: PRIMARY CARE PROVIDER [name], [place] [phone]

## 2015-01-21 ENCOUNTER — Ambulatory Visit: Payer: Managed Care, Other (non HMO) | Admitting: Physical Therapy

## 2015-01-21 DIAGNOSIS — M25612 Stiffness of left shoulder, not elsewhere classified: Secondary | ICD-10-CM

## 2015-01-21 NOTE — Therapy (Signed)
[place], [place], [address] Phone: [phone]   Fax:  [phone]  Physical Therapy Treatment  Patient Details  Name: Rachel [name] MRN: [id number] Date of Birth: 07/27/1955 Referring Provider: Dr. [name]  Encounter Date: 01/21/2015      PT End of Session - 01/21/15 1737    Visit Number 5   Number of Visits 9   Date for PT Re-Evaluation 02/17/15   PT Start Time 5277   PT Stop Time 1518   PT Time Calculation (min) 45 min   Activity Tolerance Patient tolerated treatment well   Behavior During Therapy [facility] for tasks assessed/performed      Past Medical History  Diagnosis Date  . Hypertension   . Alopecia areata   . Status post chemotherapy 11/26/2013 - 03/11/2014    6 cycles of neoadjuvant chemotherapy with TCH-P (Taxotere, Carbolatin, Herceptin and Perjeta followed by Neulasta  . Hyperlipidemia   . Type 2 diabetes, diet controlled ([place])   . History of endometriosis   . Infection due to port-a-cath     left chest area w/ cellulitis and skin ulcer   . 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 Anemia due to chemotherapy   . Peripheral neuropathy due to chemotherapy ([place])     hands  . S/P radiation therapy 07/10/2014 through 08/23/2014     Left chest wall/regional lymph nodes 5040 cGy in 28 sessions, left chest wall boost 1000 cGy in 5 sessions     Past Surgical History  Procedure Laterality Date  . Colonoscopy    . Portacath placement N/A 11/20/2013    Procedure: INSERTION PORT-A-CATH;  Surgeon: [name], MD;  Location: [place];  Service: General;   Laterality: N/A;  . Breast surgery Left 7/15    bx  . Mastectomy w/ sentinel node biopsy Left 05/08/2014    Procedure: LEFT MASTECTOMY WITH SENTINEL LYMPH NODE BIOPSY;  Surgeon: [name], MD;  Location: [place];  Service: General;  Laterality: Left;  . Transthoracic echocardiogram  05-30-2014    grade I diastolic dysfunction/ ef 82-42%/  moderate diffuse calcification AV without stenosis/  trivial MR and TR  . Abdominal hysterectomy  1995    and 1996 Laparosopic Bilateral Salpingoophorectomy  . Port-a-cath removal Left 08/22/2014    Procedure: REMOVAL PORT-A-CATH;  Surgeon: [name], MD;  Location: [place];  Service: General;  Laterality: Left;    There were no vitals filed for this visit.  Visit Diagnosis:  Stiffness of joint, shoulder region, left      Subjective Assessment - 01/21/15 1437    Subjective Felt great after last visit.  Attended ABC class yesterday and learned a lot.   Currently in Pain? No/denies            [facility] PT Assessment - 01/21/15 0001    Assessment   Referring Provider Dr. [name]   AROM   Left Shoulder Flexion 145 Degrees   Left Shoulder ABduction 164 Degrees                     [facility] Adult PT Treatment/Exercise - 01/21/15 0001    Lumbar Exercises: Stretches  Lower Trunk Rotation 5 reps;Other (comment)  with arms outstretched in "T"; approx. 3 second holds   Shoulder Exercises: Supine   Other Supine Exercises reviewed HEP given last time for supine scapular series x 10 each   Shoulder Exercises: Standing   Other Standing Exercises doorway stretch for bilat. shoulder ER and horizontal abduction x 8   Shoulder Exercises: Pulleys   Flexion 2 minutes   ABduction 2 minutes   Shoulder Exercises: Therapy Ball   Flexion 10 reps   ABduction 5 reps   Manual Therapy   Myofascial Release left UE myofascial pulling in supine   Passive ROM supine for left shoulder ER, abduction, and flexion                 PT Education - 01/21/15 1736    Education provided Yes   Education Details supine lower trunk rotation with arms outstretched for left chest stretch   Person(s) Educated Patient   Methods Explanation;Handout   Comprehension Returned demonstration                [facility] Goals - 01/21/15 1739    CC Long Term Goal  #2   Title Patient will improve left shoulder active abduction to at least 165 degrees.   Baseline 164 degrees on 01/21/15.   Status On-going            Plan - 01/21/15 1507    Clinical Impression Statement Patient did benefit from review of scapular series with red Theraband to correct technique on a couple of those.  Left shoulder flexion measurement was decreased a little today, but abduction was improved.   Overall, doing well.,                                                                   Pt will benefit from skilled therapeutic intervention in order to improve on the following deficits Decreased range of motion;Impaired UE functional use   Rehab Potential Excellent   PT Frequency 2x / week   PT Duration 4 weeks   PT Treatment/Interventions Therapeutic exercise;Passive range of motion;Manual techniques   PT Next Visit Plan Cont P/AA/AROM for left shoulder along with HEP for same, review HEP prn and progress.    PT Home Exercise Plan Supine scapular series   Consulted and Agree with Plan of Care Patient        Problem List Patient Active Problem List   Diagnosis Date Noted  . Cellulitis 07/15/2014  . Hypersensitivity reaction 07/06/2014  . Breast cancer of upper-outer quadrant of left female breast ([place]) 11/07/2013    Krimson Massmann 01/21/2015, 5:42 PM  [place], [place], [address] Phone: [phone]   Fax:  [phone]  Name: Rachel [name] MRN: [id number] Date of Birth: [birth year]    Serafina Royals, PT 01/21/2015 5:42 PM

## 2015-01-21 NOTE — Patient Instructions (Signed)
Supine With Rotation    Lie on back with one knee drawn toward chest. Slowly bring bent leg across body until stretch is felt in lower back area. Hold __30 seconds_ seconds. Repeat to other side. Repeat __1-2_ times per session. Do _1-2__ sessions per day.  Copyright  VHI. All rights reserved.

## 2015-01-23 ENCOUNTER — Telehealth: Payer: Self-pay | Admitting: *Deleted

## 2015-01-23 ENCOUNTER — Encounter: Payer: Self-pay | Admitting: Physical Therapy

## 2015-01-23 ENCOUNTER — Ambulatory Visit: Payer: Managed Care, Other (non HMO) | Admitting: Physical Therapy

## 2015-01-23 DIAGNOSIS — Z9189 Other specified personal risk factors, not elsewhere classified: Secondary | ICD-10-CM

## 2015-01-23 DIAGNOSIS — M25612 Stiffness of left shoulder, not elsewhere classified: Secondary | ICD-10-CM | POA: Diagnosis not present

## 2015-01-23 NOTE — Telephone Encounter (Signed)
Patient called to find out if it is ok for her to use Maxadol a form of Rogaine on her scalp while her hair grows in. She will be going to her dermatologist but in the interm wants to make sure this is safe to use topically. Pt would like a return call at [phone].  Message forwarded to Chestine Spore, ANP for review.

## 2015-01-23 NOTE — Therapy (Signed)
[place], [place], [address] Phone: [phone]   Fax:  [phone]  Physical Therapy Treatment  Patient Details  Name: Rachel [name] MRN: [id number] Date of Birth: 07-29-1955 Referring Provider: Dr. [name]  Encounter Date: 01/23/2015      PT End of Session - 01/23/15 1119    Visit Number 6   Number of Visits 9   Date for PT Re-Evaluation 02/17/15   PT Start Time 1102   PT Stop Time 1148   PT Time Calculation (min) 46 min   Activity Tolerance Patient tolerated treatment well   Behavior During Therapy [facility] for tasks assessed/performed      Past Medical History  Diagnosis Date  . Hypertension   . Alopecia areata   . Status post chemotherapy 11/26/2013 - 03/11/2014    6 cycles of neoadjuvant chemotherapy with TCH-P (Taxotere, Carbolatin, Herceptin and Perjeta followed by Neulasta  . Hyperlipidemia   . Type 2 diabetes, diet controlled ([place])   . History of endometriosis   . Infection due to port-a-cath     left chest area w/ cellulitis and skin ulcer   . 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 Anemia due to chemotherapy   . Peripheral neuropathy due to chemotherapy ([place])     hands  . S/P radiation therapy 07/10/2014 through 08/23/2014     Left chest wall/regional lymph nodes 5040 cGy in 28 sessions, left chest wall boost 1000 cGy in 5 sessions     Past Surgical History  Procedure Laterality Date  . Colonoscopy    . Portacath placement N/A 11/20/2013    Procedure: INSERTION PORT-A-CATH;  Surgeon: [name], MD;  Location: [place];  Service: General;   Laterality: N/A;  . Breast surgery Left 7/15    bx  . Mastectomy w/ sentinel node biopsy Left 05/08/2014    Procedure: LEFT MASTECTOMY WITH SENTINEL LYMPH NODE BIOPSY;  Surgeon: [name], MD;  Location: [place];  Service: General;  Laterality: Left;  . Transthoracic echocardiogram  05-30-2014    grade I diastolic dysfunction/ ef 37-04%/  moderate diffuse calcification AV without stenosis/  trivial MR and TR  . Abdominal hysterectomy  1995    and 1996 Laparosopic Bilateral Salpingoophorectomy  . Port-a-cath removal Left 08/22/2014    Procedure: REMOVAL PORT-A-CATH;  Surgeon: [name], MD;  Location: [place];  Service: General;  Laterality: Left;    There were no vitals filed for this visit.  Visit Diagnosis:  Stiffness of joint, shoulder region, left  At risk for lymphedema      Subjective Assessment - 01/23/15 1104    Subjective I have no pain.  My left shoulder tightness is much better.   Pertinent History Diagnosed with breast cancer on left July 2015; had neo-adjuvant chemotherapy, then mastectomy 05/08/14 with two lymph nodes removed (had microscopic amount of cancer).  Had hematoma and developed cellulitis of chest from Portacath infection so had five rounds of antibiotics; got Portacath out 08/22/14.  Then had radiation x 6 weeks, completed 08/23/14, and it included left chest, axilla, and supraclavicular area.  Patient Stated Goals wants to be as healthy as possible   Currently in Pain? No/denies                         [facility] Adult PT Treatment/Exercise - 01/23/15 0001    Shoulder Exercises: Standing   Retraction AROM;Both;10 reps  3 second holds in front of mirror for self correction   Other Standing Exercises Lattisimus stretch with noth hands on wall x10 second holds x3   Other Standing Exercises Doorway stretch for bilatearl horizontal abduction stretch 3x10 seconds   Shoulder Exercises: Pulleys   Flexion 2 minutes    ABduction 2 minutes   ABduction Limitations Verbal and tactile cues to ensure full ER with abduction   Shoulder Exercises: Therapy Ball   Flexion 10 reps   Flexion Limitations with overpressure using body weight at end ROM   ABduction 10 reps   ABduction Limitations with overpressure using body weight at end ROM   Shoulder Exercises: ROM/Strengthening   "W" Arms Standing against wall 5 reps for 5 seconds   Manual Therapy   Myofascial Release left UE myofascial pulling in supine   Passive ROM supine and right sidelying for left shoulder ER, abduction, and flexion                        [facility] Clinic Goals - 01/21/15 1739    CC Long Term Goal  #2   Title Patient will improve left shoulder active abduction to at least 165 degrees.   Baseline 164 degrees on 01/21/15.   Status On-going            Plan - 01/23/15 1148    Clinical Impression Statement Patient loosened shoulder ROM quickly with manual techniques; responded well to PROM in right sidelying.  Will benefit from continued PT to increase ROM, decrease tightness and progress to strengthening.   Pt will benefit from skilled therapeutic intervention in order to improve on the following deficits Decreased range of motion;Impaired UE functional use   Rehab Potential Excellent   PT Frequency 2x / week   PT Duration 4 weeks   PT Treatment/Interventions Therapeutic exercise;Passive range of motion;Manual techniques   PT Next Visit Plan Cont P/AA/AROM for left shoulder along with HEP progression   Consulted and Agree with Plan of Care Patient        Problem List Patient Active Problem List   Diagnosis Date Noted  . Cellulitis 07/15/2014  . Hypersensitivity reaction 07/06/2014  . Breast cancer of upper-outer quadrant of left female breast ([place]) 11/07/2013   Rachel [name], PT 01/23/2015 12:06 PM  [place], [place],  [address] Phone: [phone]   Fax:  [phone]  Name: Rachel [name] MRN: [id number] Date of Birth: 15-Dec-1955

## 2015-01-23 NOTE — Telephone Encounter (Signed)
Okay for patient to use topical minoxidil (thinking this is what she meant) but would avoid using any products that have minoxidil plus something else. Please call patient back and advise; if it's a different product than minoxidil, need to review further.  Thanks!

## 2015-01-27 NOTE — Telephone Encounter (Signed)
Received call back from patient and informed of response from Chestine Spore, NP. Pt voiced understanding

## 2015-01-27 NOTE — Telephone Encounter (Signed)
Message left for pt- 10/21 and 10/24 for return call.

## 2015-01-28 ENCOUNTER — Ambulatory Visit: Payer: Managed Care, Other (non HMO)

## 2015-01-28 DIAGNOSIS — Z9189 Other specified personal risk factors, not elsewhere classified: Secondary | ICD-10-CM

## 2015-01-28 DIAGNOSIS — M25612 Stiffness of left shoulder, not elsewhere classified: Secondary | ICD-10-CM | POA: Diagnosis not present

## 2015-01-28 NOTE — Therapy (Signed)
[place], [place], [address] Phone: [phone]   Fax:  [phone]  Physical Therapy Treatment  Patient Details  Name: Rachel [name] MRN: [id number] Date of Birth: 05-03-1955 Referring Provider: Dr. [name]  Encounter Date: 01/28/2015      PT End of Session - 01/28/15 1020    Visit Number 7   Number of Visits 9   Date for PT Re-Evaluation 02/17/15   PT Start Time 0936   PT Stop Time 1020   PT Time Calculation (min) 44 min   Activity Tolerance Patient tolerated treatment well   Behavior During Therapy [facility] for tasks assessed/performed      Past Medical History  Diagnosis Date  . Hypertension   . Alopecia areata   . Status post chemotherapy 11/26/2013 - 03/11/2014    6 cycles of neoadjuvant chemotherapy with TCH-P (Taxotere, Carbolatin, Herceptin and Perjeta followed by Neulasta  . Hyperlipidemia   . Type 2 diabetes, diet controlled ([place])   . History of endometriosis   . Infection due to port-a-cath     left chest area w/ cellulitis and skin ulcer   . 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 Anemia due to chemotherapy   . Peripheral neuropathy due to chemotherapy ([place])     hands  . S/P radiation therapy 07/10/2014 through 08/23/2014     Left chest wall/regional lymph nodes 5040 cGy in 28 sessions, left chest wall boost 1000 cGy in 5 sessions     Past Surgical History  Procedure Laterality Date  . Colonoscopy    . Portacath placement N/A 11/20/2013    Procedure: INSERTION PORT-A-CATH;  Surgeon: [name], MD;  Location: [place];  Service: General;   Laterality: N/A;  . Breast surgery Left 7/15    bx  . Mastectomy w/ sentinel node biopsy Left 05/08/2014    Procedure: LEFT MASTECTOMY WITH SENTINEL LYMPH NODE BIOPSY;  Surgeon: [name], MD;  Location: [place];  Service: General;  Laterality: Left;  . Transthoracic echocardiogram  05-30-2014    grade I diastolic dysfunction/ ef 74-25%/  moderate diffuse calcification AV without stenosis/  trivial MR and TR  . Abdominal hysterectomy  1995    and 1996 Laparosopic Bilateral Salpingoophorectomy  . Port-a-cath removal Left 08/22/2014    Procedure: REMOVAL PORT-A-CATH;  Surgeon: [name], MD;  Location: [place];  Service: General;  Laterality: Left;    There were no vitals filed for this visit.  Visit Diagnosis:  Stiffness of joint, shoulder region, left  At risk for lymphedema      Subjective Assessment - 01/28/15 0939    Subjective I was having pain in my Rt sided cervical area that seemed to radiate down my whole spine and it lasted for 4 days. I took ibuprofen during those days that seemed to help and I really couldn't tell what flared it up. I just rested those days and cancelled my massage appt last week and it eased off after the 4 days.    Currently in Pain? No/denies  [facility] Adult PT Treatment/Exercise - 01/28/15 0001    Shoulder Exercises: Pulleys   ABduction 2 minutes   ABduction Limitations No longer feels much of a stretch with this.   Shoulder Exercises: Therapy Ball   Flexion 10 reps   Flexion Limitations with overpressure using bodyweight at end ROM   ABduction 10 reps   ABduction Limitations with overpressure using body weight at end ROM   Shoulder Exercises: ROM/Strengthening   "W" Arms Avoided this today as pt reports could feel the muscles tightening back up that were hurting last week   Other ROM/Strengthening Exercises From Strength ABC Program: Bil chest, shoulder and tricep stretches 1 rep, 15 second  each.   Manual Therapy   Myofascial Release left UE myofascial pulling in supine   Passive ROM supine and right sidelying for left shoulder ER, abduction, and flexion                        Long Term Clinic Goals - 01/21/15 1739    CC Long Term Goal  #2   Title Patient will improve left shoulder active abduction to at least 165 degrees.   Baseline 164 degrees on 01/21/15.   Status On-going            Plan - 01/28/15 1021    Clinical Impression Statement Pt continues to notice benefit of stretching after session and reports she feels ready to discharge at her next visit. Overall whe is doing well and making good progress towards goals.     PT Next Visit Plan Reassess HEP or instruct in Strength ABC Program?? Cont manual techniques and remeasure ROM/assess goals for DC next visit.   Consulted and Agree with Plan of Care Patient        Problem List Patient Active Problem List   Diagnosis Date Noted  . Cellulitis 07/15/2014  . Hypersensitivity reaction 07/06/2014  . Breast cancer of upper-outer quadrant of left female breast ([place]) 11/07/2013    Otelia Limes, PTA 01/28/2015, 10:28 AM  [place], [place], [address] Phone: [phone]   Fax:  [phone]  Name: ELYN KROGH MRN: [id number] Date of Birth: 1955-07-22

## 2015-01-30 ENCOUNTER — Ambulatory Visit: Payer: Managed Care, Other (non HMO)

## 2015-01-30 DIAGNOSIS — Z9189 Other specified personal risk factors, not elsewhere classified: Secondary | ICD-10-CM

## 2015-01-30 DIAGNOSIS — M25612 Stiffness of left shoulder, not elsewhere classified: Secondary | ICD-10-CM

## 2015-01-30 NOTE — Therapy (Signed)
[place], [place], [address] Phone: [phone]   Fax:  [phone]  Physical Therapy Treatment  Patient Details  Name: Rachel [name] MRN: [id number] Date of Birth: Oct 21, 1955 Referring Provider: Dr. [name]  Encounter Date: 01/30/2015      PT End of Session - 01/30/15 1020    Visit Number 8   Number of Visits 9   Date for PT Re-Evaluation 02/17/15   PT Start Time 0934   PT Stop Time 1024   PT Time Calculation (min) 50 min   Activity Tolerance Patient tolerated treatment well   Behavior During Therapy [facility] for tasks assessed/performed      Past Medical History  Diagnosis Date  . Hypertension   . Alopecia areata   . Status post chemotherapy 11/26/2013 - 03/11/2014    6 cycles of neoadjuvant chemotherapy with TCH-P (Taxotere, Carbolatin, Herceptin and Perjeta followed by Neulasta  . Hyperlipidemia   . Type 2 diabetes, diet controlled ([place])   . History of endometriosis   . Infection due to port-a-cath     left chest area w/ cellulitis and skin ulcer   . 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 Anemia due to chemotherapy   . Peripheral neuropathy due to chemotherapy ([place])     hands  . S/P radiation therapy 07/10/2014 through 08/23/2014     Left chest wall/regional lymph nodes 5040 cGy in 28 sessions, left chest wall boost 1000 cGy in 5 sessions     Past Surgical History  Procedure Laterality Date  . Colonoscopy    . Portacath placement N/A 11/20/2013    Procedure: INSERTION PORT-A-CATH;  Surgeon: [name], MD;  Location: [place];  Service: General;   Laterality: N/A;  . Breast surgery Left 7/15    bx  . Mastectomy w/ sentinel node biopsy Left 05/08/2014    Procedure: LEFT MASTECTOMY WITH SENTINEL LYMPH NODE BIOPSY;  Surgeon: [name], MD;  Location: [place];  Service: General;  Laterality: Left;  . Transthoracic echocardiogram  05-30-2014    grade I diastolic dysfunction/ ef 49-67%/  moderate diffuse calcification AV without stenosis/  trivial MR and TR  . Abdominal hysterectomy  1995    and 1996 Laparosopic Bilateral Salpingoophorectomy  . Port-a-cath removal Left 08/22/2014    Procedure: REMOVAL PORT-A-CATH;  Surgeon: [name], MD;  Location: [place];  Service: General;  Laterality: Left;    There were no vitals filed for this visit.  Visit Diagnosis:  Stiffness of joint, shoulder region, left  At risk for lymphedema      Subjective Assessment - 01/30/15 0939    Subjective Been continuing with slowly increasing my exercises since my Rt cervical pain last week and that has been going well. Still walking every day for 3-3.5 miles.    Currently in Pain? No/denies            [facility] PT Assessment - 01/30/15 0001    AROM   Left Shoulder Flexion 154 Degrees   Left Shoulder ABduction 167 Degrees                     [facility] Adult PT Treatment/Exercise -  01/30/15 0001    Manual Therapy   Myofascial Release To Lt chest wall vertically and horizontally; left UE myofascial pulling in supine   Passive ROM supine for left shoulder ER, abduction, and flexion                PT Education - 01/30/15 1027    Education provided Yes   Education Details Answered pts questions regarding when to wear compression sleeve and how to continue progressing safely HEP and to continue walking.     Person(s) Educated Patient   Methods Explanation;Demonstration   Comprehension Verbalized understanding                [facility] Goals - 01/30/15 [phone]    CC Long Term Goal  #1   Title Patient  will improve left shoulder active flexion to at least 150 degrees.  154 degrees attained 01/30/15   Time 4   Period Weeks   Status Achieved   CC Long Term Goal  #2   Title Patient will improve left shoulder active abduction to at least 165 degrees.  167 degrees attained 01/30/15   Time 4   Period Weeks   Status Achieved   CC Long Term Goal  #3   Title left shoulder active ER to at least 90 degrees   Time 4   Period Weeks   Status Achieved   CC Long Term Goal  #4   Title Patient will be independent with an HEP.  Pt will also be starting [employer] in January.    Time 4   Period Weeks   Status Achieved   CC Long Term Goal  #5   Title Patient will be knowledgeable about lymphedema risk reduction practices.   Time 4   Period Weeks   Status Achieved            Plan - 01/30/15 1026    Clinical Impression Statement Pt has made excellent progress and has met all goals. She is ready for discharge at this time and reports she feels ready for this as well.    Pt will benefit from skilled therapeutic intervention in order to improve on the following deficits Decreased range of motion;Impaired UE functional use   Rehab Potential Excellent   PT Frequency 2x / week   PT Duration 4 weeks   PT Next Visit Plan Discharge this visit.   Consulted and Agree with Plan of Care Patient        Problem List Patient Active Problem List   Diagnosis Date Noted  . Cellulitis 07/15/2014  . Hypersensitivity reaction 07/06/2014  . Breast cancer of upper-outer quadrant of left female breast ([place]) 11/07/2013   Collie Siad, PTA 01/30/2015 11:25 AM   [place], [place], [address] Phone: [phone]   Fax:  [phone]  Name: Rachel [name] MRN: [id number] Date of Birth: [birth year]  PHYSICAL THERAPY DISCHARGE SUMMARY  Visits from Start of Care: 8  Current functional level related to goals /  functional outcomes: Goals met.  See above for objective findings.   Remaining deficits: None   Education / Equipment: None  Plan: Patient agrees to discharge.  Patient goals were met. Patient is being discharged due to meeting the stated rehab goals.  ?????       Annia Friendly, [place] 01/30/2015 11:25 AM

## 2015-03-03 ENCOUNTER — Ambulatory Visit (HOSPITAL_BASED_OUTPATIENT_CLINIC_OR_DEPARTMENT_OTHER): Payer: Managed Care, Other (non HMO) | Admitting: Hematology

## 2015-03-03 ENCOUNTER — Other Ambulatory Visit (HOSPITAL_BASED_OUTPATIENT_CLINIC_OR_DEPARTMENT_OTHER): Payer: Managed Care, Other (non HMO)

## 2015-03-03 ENCOUNTER — Encounter: Payer: Self-pay | Admitting: Hematology

## 2015-03-03 ENCOUNTER — Telehealth: Payer: Self-pay | Admitting: Hematology

## 2015-03-03 VITALS — BP 130/78 | HR 77 | Temp 98.2°F | Resp 18 | Ht 65.0 in | Wt 161.3 lb

## 2015-03-03 DIAGNOSIS — Z170 Estrogen receptor positive status [ER+]: Secondary | ICD-10-CM

## 2015-03-03 DIAGNOSIS — C50412 Malignant neoplasm of upper-outer quadrant of left female breast: Secondary | ICD-10-CM | POA: Diagnosis not present

## 2015-03-03 DIAGNOSIS — Z9221 Personal history of antineoplastic chemotherapy: Secondary | ICD-10-CM | POA: Diagnosis not present

## 2015-03-03 DIAGNOSIS — Z79811 Long term (current) use of aromatase inhibitors: Secondary | ICD-10-CM | POA: Diagnosis not present

## 2015-03-03 LAB — COMPREHENSIVE METABOLIC PANEL (CC13)
ALT: 13 U/L (ref 0–55)
ANION GAP: 8 meq/L (ref 3–11)
AST: 18 U/L (ref 5–34)
Albumin: 3.5 g/dL (ref 3.5–5.0)
Alkaline Phosphatase: 65 U/L (ref 40–150)
BUN: 18.6 mg/dL (ref 7.0–26.0)
CO2: 30 meq/L — AB (ref 22–29)
CREATININE: 0.9 mg/dL (ref 0.6–1.1)
Calcium: 9.6 mg/dL (ref 8.4–10.4)
Chloride: 106 mEq/L (ref 98–109)
EGFR: 78 mL/min/{1.73_m2} — ABNORMAL LOW (ref >90)
GLUCOSE: 92 mg/dL (ref 70–140)
Potassium: 4.2 mEq/L (ref 3.5–5.1)
Sodium: 144 mEq/L (ref 136–145)
Total Bilirubin: 0.85 mg/dL (ref 0.20–1.20)
Total Protein: 7 g/dL (ref 6.4–8.3)

## 2015-03-03 LAB — CBC WITH DIFFERENTIAL/PLATELET
BASO%: 0.2 % (ref 0.0–2.0)
Basophils Absolute: 0 10*3/uL (ref 0.0–0.1)
EOS%: 1.4 % (ref 0.0–7.0)
Eosinophils Absolute: 0.1 10*3/uL (ref 0.0–0.5)
HCT: 38.7 % (ref 34.8–46.6)
HGB: 12.9 g/dL (ref 11.6–15.9)
LYMPH#: 0.7 10*3/uL — AB (ref 0.9–3.3)
LYMPH%: 16.6 % (ref 14.0–49.7)
MCH: 30.1 pg (ref 25.1–34.0)
MCHC: 33.3 g/dL (ref 31.5–36.0)
MCV: 90.2 fL (ref 79.5–101.0)
MONO#: 0.5 10*3/uL (ref 0.1–0.9)
MONO%: 12.8 % (ref 0.0–14.0)
NEUT%: 69 % (ref 38.4–76.8)
NEUTROS ABS: 2.9 10*3/uL (ref 1.5–6.5)
Platelets: 156 10*3/uL (ref 145–400)
RBC: 4.29 10*6/uL (ref 3.70–5.45)
RDW: 15.2 % — AB (ref 11.2–14.5)
WBC: 4.2 10*3/uL (ref 3.9–10.3)

## 2015-03-03 NOTE — Progress Notes (Signed)
Rachel [name] OFFICE PROGRESS NOTE  Patient Care Team: Seward Carol, MD as PCP - General (Internal Medicine) [name], MD as Consulting Physician (General Surgery) Truitt Merle, MD as Consulting Physician (Hematology) Arloa Koh, MD as Consulting Physician (Radiation Oncology) Sylvan Cheese, NP as Nurse Practitioner (Hematology and Oncology)  SUMMARY OF ONCOLOGIC HISTORY: Oncology History    Breast cancer of upper-outer quadrant of left female breast   Staging form: Breast, AJCC 7th Edition     Clinical: Stage IIB (T3, N0, cM0) - Unsigned       Staging comments: Staged at breast conference 8.5.15      Pathologic stage from 05/08/2014: Stage IIA (yT1c(m), N1a, cM0) - Signed by Seward Grater, MD on 05/15/2014       Staging comments: Staged on final mastectomy specimen by Dr. Donato Heinz        Breast cancer of upper-outer quadrant of left female breast ([place])   10/17/2013 Mammogram Left breast: persistent distortion in the UOQ, corresponding to the screening mammographic finding.    10/26/2013 Breast US Left breast: hypoechoic irregular mass with surrounding distortion in the left breast 2 o'clock location 4 cm from the nipple measuring 1.1 x 0.9 x 0.7 cm. This corresponds to the mammographic finding. No left axillary LAD   10/30/2013 Initial Biopsy Left breast core needle bx: Invasive ductal carcinoma, grade 2, ER+ (100%), PR+ (42%), HER2neu positive (ratio 2.8), Ki67 53%, DCIS with calcifications   11/05/2013 Breast MRI Irregular enhancing mass measuring 5.1 cm in the upper and upper-outer quadrants of the left breast. The MR enhancing mass is much larger than the mass seen mammographically or sonographically.    11/05/2013 Breast MRI Suspicious level 1 left axillary lymph node concerning for metastatic involving. But [place] negative, no node biopsy.    11/07/2013 Clinical Stage Stage IIB: T2 N0 cM0   11/26/2013 - 11/26/2014 Neo-Adjuvant Chemotherapy docetaxel, carboplatin, pertuzumab,  and transtuzumab x 6 cycles (11/26/13-03/11/14) followed by mainteance trastuzumab (04/08/14-11/26/14)  to complete 1 year of therapy   05/08/2014 Surgery Left mastectomy/SLNB Barry Dienes):  Multifocal invasive ductal carcinoma (0.5 cm, 1.4 cm), DCIS, 2/2 SLN were positive.   05/08/2014 Pathologic Stage Stage IIA: ympT1c pN1a    07/10/2014 - 08/23/2014 Radiation Therapy Adjuvant RT Valere Dross): Left chest wall/regional lymph nodes 50.4 Gy over 28 fractions; left chest wall boost 10 Gy over 5 fractions   08/22/2014 Procedure port removal. Tip culture (+) Pseudomonas.    09/2014 -  Anti-estrogen oral therapy Anastrozole 1 mg daily.  Planned duration of therapy 10 years Burr Medico).   01/16/2015 Survivorship Survivorship visit completed and copy of care plan provided to patient.     CURRENT THERAPY: Anastrozole 1 mg once daily, started in June 2016.  INTERVAL HISTORY: Rachel [name] returns for follow up. She is doing very well overall. She has participated several indication across in our [place], and also has been on physical therapy for lymphedema prevention and the pelvic exercise. She does have vaginal dryness and tightness, which has improved after the exercise. She is tolerating anastrozole very well, no significant hot flash, muscular or joint discomfort. She initially had skin itchiness, no rash, which has improved over time. She also has a mild constipation.  No other noticeable side effects. She has good appetite and eating well. Weight is stable.  REVIEW OF SYSTEMS:   Constitutional: Denies fevers, chills.  Eyes: Denies blurriness of vision Ears, nose, mouth, throat, and face: Denies mucositis or sore throat Respiratory: Denies cough, dyspnea or wheezes  Cardiovascular: Denies palpitation, chest discomfort or lower extremity swelling Gastrointestinal:  Denies nausea, heartburn or change in bowel habits Skin: Denies abnormal skin rashes except a blister at the old port site Lymphatics: Denies new  lymphadenopathy or easy bruising Neurological:Denies numbness, tingling or new weaknesses Behavioral/Psych: Mood is stable, no new changes  All other systems were reviewed with the patient and are negative except those in history   MEDICAL HISTORY:  Past Medical History  Diagnosis Date  . Hypertension   . Alopecia areata   . Status post chemotherapy 11/26/2013 - 03/11/2014    6 cycles of neoadjuvant chemotherapy with TCH-P (Taxotere, Carbolatin, Herceptin and Perjeta followed by Neulasta  . Hyperlipidemia   . Type 2 diabetes, diet controlled ([place])   . History of endometriosis   . Infection due to port-a-cath     left chest area w/ cellulitis and skin ulcer   . Breast cancer [facility]) oncologist-  dr Krista Blue Aljean Horiuchi/  dx 07/ 2015--  DCIS left breast cT3, N0, Stage IIB  triple positive ,  ER+PR+HER2+/       s/p  chemotherapy 11-26-2013 to 03-11-2014 (after chemo, yT1cN1a)--  s/p  left mastectomy w/ SLN bx 05-08-2014/  radiation therapy currently started 07-10-2014  . Anemia due to chemotherapy   . 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 S/P radiation therapy 07/10/2014 through 08/23/2014     Left chest wall/regional lymph nodes 5040 cGy in 28 sessions, left chest wall boost 1000 cGy in 5 sessions     SURGICAL HISTORY: Past Surgical History  Procedure Laterality Date  . Colonoscopy    . Portacath placement N/A 11/20/2013    Procedure: INSERTION PORT-A-CATH;  Surgeon: [name], MD;  Location: [facility];  Service: General;  Laterality: N/A;  . Breast surgery Left 7/15    bx  . Mastectomy w/ sentinel node biopsy Left 05/08/2014    Procedure: LEFT MASTECTOMY WITH SENTINEL LYMPH NODE BIOPSY;  Surgeon: [name], MD;  Location: [facility];  Service: General;  Laterality: Left;  . Transthoracic echocardiogram  05-30-2014    grade I diastolic dysfunction/ ef 53-29%/  moderate diffuse calcification AV without  stenosis/  trivial MR and TR  . Abdominal hysterectomy  1995    and 1996 Laparosopic Bilateral Salpingoophorectomy  . Port-a-cath removal Left 08/22/2014    Procedure: REMOVAL PORT-A-CATH;  Surgeon: [name], MD;  Location: [facility];  Service: General;  Laterality: Left;    I have reviewed the social history and family history with the patient and they are unchanged from previous note.  ALLERGIES:  is allergic to eggs or egg-derived products; nickel; sulfa antibiotics; neosporin; multihance; penicillins; and tetanus toxoids.  MEDICATIONS:    Medication List       This list is accurate as of: 03/03/15 11:15 AM.  Always use your most recent med list.               acetaminophen 500 MG tablet  Commonly known as:  TYLENOL  Take 500 mg by mouth every 6 (six) hours as needed (pain).     anastrozole 1 MG tablet  Commonly known as:  ARIMIDEX  Take 1 tablet (1 mg total) by mouth daily.     calcium citrate-vitamin D 315-200 MG-UNIT tablet  Commonly known as:  CITRACAL+D  Take 1 tablet by mouth daily.     clobetasol 0.05 % external solution  Commonly known as:  TEMOVATE  Apply 1 application topically 2 (two)  times daily.     Fish Oil 1000 MG Caps  Take 1 capsule by mouth daily.     Garlic 7322 MG Caps  Take 1 capsule by mouth daily.     hydrochlorothiazide 25 MG tablet  Commonly known as:  HYDRODIURIL  Take 25 mg by mouth every morning.     minoxidil 2 % external solution  Commonly known as:  ROGAINE  Apply 5 % topically 2 (two) times daily.     multivitamin with minerals Tabs tablet  Take 1 tablet by mouth daily.         PHYSICAL EXAMINATION: ECOG PERFORMANCE STATUS: 0 BP 130/78 mmHg  Pulse 77  Temp(Src) 98.2 F (36.8 C) (Oral)  Resp 18  Ht '5\' 5"'  (1.651 m)  Wt 161 lb 4.8 oz (73.165 kg)  BMI 26.84 kg/m2  SpO2 100% GENERAL:alert, no distress and comfortable SKIN: skin color, texture, turgor are normal, no rashes or significant lesions  except (+) small confluent folliculitis/abscess at the previous port site on left upper chest. (+) skin pigmentation at the left mastectomy site secondary to radiation. EYES: normal, Conjunctiva are pink and non-injected, sclera clear OROPHARYNX:no exudate, no erythema and lips, buccal mucosa, and tongue normal  NECK: supple, thyroid normal size, non-tender, without nodularity LYMPH:  no palpable lymphadenopathy in the cervical, axillary or inguinal LUNGS: clear to auscultation and percussion with normal breathing effort HEART: regular rate & rhythm and no murmurs and no lower extremity edema ABDOMEN:abdomen soft, non-tender and normal bowel sounds Musculoskeletal:no cyanosis of digits and no clubbing  NEURO: alert & oriented x 3 with fluent speech, no focal motor/sensory deficits Breasts: Status post left mastectomy, surgical wound healing well, small scar at the previous port site.  (+) skin pigmentation secondary to radiation. Palpation of the right breasts and axilla revealed no obvious mass that I could appreciate. EXTREMITIES: no edema   LABORATORY DATA:  I have reviewed the data as listed CBC Latest Ref Rng 03/03/2015 11/26/2014 11/06/2014  WBC 3.9 - 10.3 10e3/uL 4.2 3.7(L) 3.5(L)  Hemoglobin 11.6 - 15.9 g/dL 12.9 12.0 12.0  Hematocrit 34.8 - 46.6 % 38.7 36.5 36.3  Platelets 145 - 400 10e3/uL 156 145 179     CMP Latest Ref Rng 03/03/2015 11/26/2014 11/06/2014  Glucose 70 - 140 mg/dl 92 87 91  BUN 7.0 - 26.0 mg/dL 18.6 21.2 21.5  Creatinine 0.6 - 1.1 mg/dL 0.9 1.0 0.9  Sodium 136 - 145 mEq/L 144 147(H) 144  Potassium 3.5 - 5.1 mEq/L 4.2 3.9 4.5  Chloride 101 - 111 mmol/L - - -  CO2 22 - 29 mEq/L 30(H) 28 32(H)  Calcium 8.4 - 10.4 mg/dL 9.6 9.5 9.7  Total Protein 6.4 - 8.3 g/dL 7.0 6.5 6.7  Total Bilirubin 0.20 - 1.20 mg/dL 0.85 0.63 0.53  Alkaline Phos 40 - 150 U/L 65 64 72  AST 5 - 34 U/L '18 18 18  ' ALT 0 - 55 U/L '13 15 14   ' PATHOLOGY REPORT: Diagnosis 05/08/2014 1. Breast,  simple mastectomy, left  - PENDING STAINS. - MULTIFOCAL INVASIVE DUCTAL CARCINOMA WITH NEOADJUVANT RELATED CHANGES, SEE COMMENT. - NEGATIVE FOR LYMPH VASCULAR INVASION. - INVASIVE TUMOR IS 4 MM FROM NEAREST MARGIN (ANTERIOR). - DUCTAL CARCINOMA IN SITU - PREVIOUS BIOPSY SITE. - BENIGN SKIN; NEGATIVE FOR TUMOR - SEE TUMOR SYNOPTIC TEMPLATE BELOW. 2. Lymph node, sentinel, biopsy, left axillary #1 - ONE LYMPH NODE, POSITIVE FOR METASTATIC MAMMARY CARCINOMA (1/1). - INTRANODAL TUMOR DEPOSIT IS 2 MM - NEGATIVE FOR EXTRACAPSULAR  TUMOR EXTENSION. 3. Lymph node, sentinel, biopsy, left axillary #2 - ONE LYMPH NODE, POSITIVE FOR METASTATIC MAMMARY CARCINOMA (1/1). - INTRANODAL TUMOR DEPOSIT IS 4 MM - NEGATIVE FOR EXTRACAPSULAR TUMOR EXTENSION.  Microscopic Comment 1. BREAST, INVASIVE TUMOR, WITH LYMPH NODES PRESENT Specimen, including laterality and lymph node sampling (sentinel, non-sentinel): Left breast Procedure: Simple mastectomy Histologic type: Ductal (x3 tumors), see comment. Grade: II of III (Tumor #1 and 2), see comment Tubule formation: 2 Nuclear pleomorphism: 3 Mitotic: 2 Grade: I of III (Tumor #3), see comment Tubule formation: 1 Nuclear pleomorphism: 1 Mitotic: 1 1 of 4 FINAL for ADAMS-FOUST, Cam[place] E ([id number]) Microscopic Comment(continued) Tumor size (glass slide measurement): 0.5 cm and 1.4 cm; gross measurement 2.0 cm, see comment. Margins: Invasive, distance to closest margin: 4 mm In-situ, distance to closest margin: 4 mm (anterior) If margin positive, focally or broadly: N/A Lymphovascular invasion: Absent Ductal carcinoma in situ: Present Grade: III of III Extensive intraductal component: Absent Lobular neoplasia: Pending stains Tumor focality: Multifocal Treatment effect: Present If present, treatment effect in breast tissue, lymph nodes or both: Tissue Extent of tumor: Skin: Negative Nipple: Negative Skeletal muscle: Negative Lymph  nodes: Examined: 2 Sentinel 0 Non-sentinel 2 Total Lymph nodes with metastasis: 2 Isolated tumor cells (< 0.2 mm): 0 Micrometastasis: (> 0.2 mm and < 2.0 mm): 0 Macrometastasis: (> 2.0 mm): X 2 Extracapsular extension: Absent Breast prognostic profile: Estrogen receptor: Not repeated, previous study demonstrated 100% positivity and 100% positivity ([id number] and [id number]) Progesterone receptor: Not repeated, previous study demonstrated 67%% and 42% positivity ([id number] and [id number]) Her 2 neu: Not repeated, previous study demonstrated amplification ([id number] and [id number]) Ki-67: Not repeated, previous study demonstrated 12% and 53% proliferation rate ([id number] and [id number]) Non-neoplastic breast: Previous biopsy site; fibrocystic change, usual ductal hyperplasia, calcifications and neoadjuvant related tissue changes. TNM: ympT1c, pN1a, pMX Comments: There are three different mass areas identified. The first central mass associated with barbell shaped clip demonstrates both in situ and invasive ductal carcinoma. Within the focus, the invasive ductal carcinoma spans 5 mm. The second posterior medial mass demonstrates both in situ and invasive ductal carcinoma spanning the entire 2.0 cm grossly identified stellate area. The third and final lateral mass with associated heart shaped clip demonstrates both in situ and invasive ductal carcinoma. The invasive tumor spans 1.4cm and is involved by and away from previous biopsy site. The lack of the myoepithelial layer was confirmed with smooth muscle myosin heavy chain, Calponin, and p63 immunostains (slide 1K).. The presence of strong diffuse E-Cadherin expression and absence of cytokeratin 5/6 expression supports a ductal phenotype to the in situ carcinoma (slide 1K). (CR:kh 05-09-14) [place] RUND DO Pathologist, Electronic Signature (Case signed 05/13/2014) Specimen Gross and Clinical Information  RADIOGRAPHIC  STUDIES: I have personally reviewed the radiological images as listed and agreed with the findings in the report.  ECHO 11/12/2014  Study Conclusions  - Left ventricle: The cavity size was normal. Wall thickness was increased in a pattern of mild LVH. There was focal basal hypertrophy. Systolic function was normal. The estimated ejection fraction was in the range of 60% to 65%. Wall motion was normal; there were no regional wall motion abnormalities. Doppler parameters are consistent with abnormal left ventricular relaxation (grade 1 diastolic dysfunction).   Mammogram 11/20/2014 IMPRESSION: No mammographic evidence of malignancy. A result letter of this screening mammogram will be mailed directly to the patient.  RECOMMENDATION: Screening mammogram in one year. ([id number])   ASSESSMENT & PLAN:  This is a very pleasant  59 years-old African [place] female from [facility] who presents with a left Invasive Ductal Carcinoma. This was picked on a mammogram and maximal diameter was 1.7 cm on Ultrasound however the MRI breast showed this mass to be large almost 5.1 cm in maximal dimension making it a clinical T3 tumor. Also it showed a suspicious looking solitary lymph node in axilla but [place] was negative, not biopsied.  Clinical T3N0M0 Stage IIB.  The tumor biology indicates a LUMINAL-B type which means ER/PR/HER2 + tumor. Tumor is strongly ER+100%, PR 42%,KI-67 Index 53% showing a brisk mitotic rate and grade is called grade 2 (but grading as per pathologist cannot be relied upon on initial biopsy).   1. 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  -her restaging breast MRI results showed good partial response -I discussed her surgical pathology findings with her in details. Unfortunately she had metastasis to 2 Sentinel lymph  nodes. Axillary lymph node dissection was discussed, Dr. Barry Dienes, Dr. Valere Dross and me and we recommend to proceed ALND, which is the current standard care. She however declined surgery due to the concern of lymphedema. She opted adjuvant radiation including the axillary area.  -She is now on adjuvant anastrozole, tolerating well, we'll continue. Recent clinical data has shown to extended aromatase inhibitor for 10 years has decreased cancer recurrence, given her advanced stage and relative young age, I recommend her to take it for total of 10 years. -We discussed breast cancer surveillance, with mammogram once a year, close follow-up every 3-4 months for the next 2-3 years then every 6-12 months -I encouraged her to continue healthy diet and exercise regularly. She is doing good job on that.   2. Bone health  - I encouraged her to continue taking calcium and vitamin D -We discussed the potential side effects of osteoporosis from anastrozole -Her last bone density scan was done on 11/19/2014, which showed normal bone density.    3. HTN, DM -Follow up with primary care physician  Plan -Continue anastrozole -Return to clinic in 3 months with lab  All questions were answered. The patient knows to call the clinic with any problems, questions or concerns. No barriers to learning was detected.  I spent 15 minutes counseling the patient face to face. The total time spent in the appointment was 20 minutes and more than 50% was on counseling and review of test results     Truitt Merle, MD 03/03/2015   11:15 AM

## 2015-03-03 NOTE — Telephone Encounter (Signed)
per pof to sch pt appt-gave pt copy of avs °

## 2015-03-05 ENCOUNTER — Telehealth: Payer: Self-pay | Admitting: Hematology

## 2015-03-05 NOTE — Telephone Encounter (Signed)
pt cld wanted avs mailed to her-mailed

## 2015-03-26 ENCOUNTER — Other Ambulatory Visit (HOSPITAL_COMMUNITY): Payer: Self-pay | Admitting: Obstetrics and Gynecology

## 2015-03-26 ENCOUNTER — Other Ambulatory Visit: Payer: Self-pay | Admitting: Obstetrics and Gynecology

## 2015-03-26 ENCOUNTER — Other Ambulatory Visit (HOSPITAL_COMMUNITY)
Admission: RE | Admit: 2015-03-26 | Discharge: 2015-03-26 | Disposition: A | Discharge status: Home / Self Care | Payer: Managed Care, Other (non HMO) | Source: Ambulatory Visit | Attending: Obstetrics and Gynecology | Admitting: Obstetrics and Gynecology

## 2015-03-26 DIAGNOSIS — Z1151 Encounter for screening for human papillomavirus (HPV): Secondary | ICD-10-CM | POA: Diagnosis not present

## 2015-03-26 DIAGNOSIS — Z01419 Encounter for gynecological examination (general) (routine) without abnormal findings: Secondary | ICD-10-CM | POA: Diagnosis present

## 2015-03-27 LAB — CYTOLOGY - PAP

## 2015-05-31 ENCOUNTER — Telehealth: Payer: Self-pay | Admitting: Hematology

## 2015-05-31 NOTE — Telephone Encounter (Signed)
Due to YF out moved lab/fu from 2/28 to 3/14 @ 9:15 am. Spoke with patient she is aware.

## 2015-06-03 ENCOUNTER — Ambulatory Visit: Payer: Managed Care, Other (non HMO) | Admitting: Hematology

## 2015-06-03 ENCOUNTER — Other Ambulatory Visit: Payer: Managed Care, Other (non HMO)

## 2015-06-16 ENCOUNTER — Other Ambulatory Visit: Payer: Self-pay | Admitting: *Deleted

## 2015-06-16 DIAGNOSIS — C50412 Malignant neoplasm of upper-outer quadrant of left female breast: Secondary | ICD-10-CM

## 2015-06-17 ENCOUNTER — Telehealth: Payer: Self-pay | Admitting: Hematology

## 2015-06-17 ENCOUNTER — Other Ambulatory Visit (HOSPITAL_BASED_OUTPATIENT_CLINIC_OR_DEPARTMENT_OTHER): Payer: Managed Care, Other (non HMO)

## 2015-06-17 ENCOUNTER — Ambulatory Visit (HOSPITAL_BASED_OUTPATIENT_CLINIC_OR_DEPARTMENT_OTHER): Payer: Managed Care, Other (non HMO) | Admitting: Hematology

## 2015-06-17 ENCOUNTER — Encounter: Payer: Self-pay | Admitting: Hematology

## 2015-06-17 VITALS — BP 131/67 | HR 68 | Temp 98.0°F | Resp 18 | Ht 65.0 in | Wt 166.4 lb

## 2015-06-17 DIAGNOSIS — Z9221 Personal history of antineoplastic chemotherapy: Secondary | ICD-10-CM

## 2015-06-17 DIAGNOSIS — C50412 Malignant neoplasm of upper-outer quadrant of left female breast: Secondary | ICD-10-CM

## 2015-06-17 DIAGNOSIS — Z79811 Long term (current) use of aromatase inhibitors: Secondary | ICD-10-CM | POA: Diagnosis not present

## 2015-06-17 DIAGNOSIS — Z170 Estrogen receptor positive status [ER+]: Secondary | ICD-10-CM | POA: Diagnosis not present

## 2015-06-17 DIAGNOSIS — I10 Essential (primary) hypertension: Secondary | ICD-10-CM

## 2015-06-17 LAB — CBC WITH DIFFERENTIAL/PLATELET
BASO%: 0.6 % (ref 0.0–2.0)
Basophils Absolute: 0 10*3/uL (ref 0.0–0.1)
EOS%: 2.5 % (ref 0.0–7.0)
Eosinophils Absolute: 0.1 10*3/uL (ref 0.0–0.5)
HEMATOCRIT: 37.4 % (ref 34.8–46.6)
HGB: 12.5 g/dL (ref 11.6–15.9)
LYMPH#: 1.1 10*3/uL (ref 0.9–3.3)
LYMPH%: 33.8 % (ref 14.0–49.7)
MCH: 29.9 pg (ref 25.1–34.0)
MCHC: 33.4 g/dL (ref 31.5–36.0)
MCV: 89.5 fL (ref 79.5–101.0)
MONO#: 0.4 10*3/uL (ref 0.1–0.9)
MONO%: 13.2 % (ref 0.0–14.0)
NEUT#: 1.6 10*3/uL (ref 1.5–6.5)
NEUT%: 49.9 % (ref 38.4–76.8)
Platelets: 152 10*3/uL (ref 145–400)
RBC: 4.18 10*6/uL (ref 3.70–5.45)
RDW: 14.6 % — ABNORMAL HIGH (ref 11.2–14.5)
WBC: 3.3 10*3/uL — AB (ref 3.9–10.3)

## 2015-06-17 LAB — COMPREHENSIVE METABOLIC PANEL
ALT: 14 U/L (ref 0–55)
ANION GAP: 5 meq/L (ref 3–11)
AST: 18 U/L (ref 5–34)
Albumin: 3.6 g/dL (ref 3.5–5.0)
Alkaline Phosphatase: 80 U/L (ref 40–150)
BUN: 16.2 mg/dL (ref 7.0–26.0)
CHLORIDE: 106 meq/L (ref 98–109)
CO2: 32 meq/L — AB (ref 22–29)
Calcium: 9.5 mg/dL (ref 8.4–10.4)
Creatinine: 0.9 mg/dL (ref 0.6–1.1)
EGFR: 79 mL/min/{1.73_m2} — ABNORMAL LOW (ref >90)
Glucose: 94 mg/dl (ref 70–140)
Potassium: 4.1 mEq/L (ref 3.5–5.1)
Sodium: 143 mEq/L (ref 136–145)
Total Bilirubin: 0.5 mg/dL (ref 0.20–1.20)
Total Protein: 6.8 g/dL (ref 6.4–8.3)

## 2015-06-17 NOTE — Progress Notes (Addendum)
[place] OFFICE PROGRESS NOTE  Patient Care Team: Seward Carol, MD as PCP - General (Internal Medicine) [name], MD as Consulting Physician (General Surgery) Truitt Merle, MD as Consulting Physician (Hematology) Arloa Koh, MD as Consulting Physician (Radiation Oncology) Sylvan Cheese, NP as Nurse Practitioner (Hematology and Oncology)  SUMMARY OF ONCOLOGIC HISTORY: Oncology History    Breast cancer of upper-outer quadrant of left female breast   Staging form: Breast, AJCC 7th Edition     Clinical: Stage IIB (T3, N0, cM0) - Unsigned       Staging comments: Staged at breast conference 8.5.15      Pathologic stage from 05/08/2014: Stage IIA (yT1c(m), N1a, cM0) - Signed by Seward Grater, MD on 05/15/2014       Staging comments: Staged on final mastectomy specimen by Dr. Donato Heinz        Breast cancer of upper-outer quadrant of left female breast ([place])   10/17/2013 Mammogram Left breast: persistent distortion in the UOQ, corresponding to the screening mammographic finding.    10/26/2013 Breast US Left breast: hypoechoic irregular mass with surrounding distortion in the left breast 2 o'clock location 4 cm from the nipple measuring 1.1 x 0.9 x 0.7 cm. This corresponds to the mammographic finding. No left axillary LAD   10/30/2013 Initial Biopsy Left breast core needle bx: Invasive ductal carcinoma, grade 2, ER+ (100%), PR+ (42%), HER2neu positive (ratio 2.8), Ki67 53%, DCIS with calcifications   11/05/2013 Breast MRI Irregular enhancing mass measuring 5.1 cm in the upper and upper-outer quadrants of the left breast. The MR enhancing mass is much larger than the mass seen mammographically or sonographically.    11/05/2013 Breast MRI Suspicious level 1 left axillary lymph node concerning for metastatic involving. But [place] negative, no node biopsy.    11/07/2013 Clinical Stage Stage IIB: T2 N0 cM0   11/26/2013 - 11/26/2014 Neo-Adjuvant Chemotherapy docetaxel, carboplatin, pertuzumab,  and transtuzumab x 6 cycles (11/26/13-03/11/14) followed by mainteance trastuzumab (04/08/14-11/26/14)  to complete 1 year of therapy   05/08/2014 Surgery Left mastectomy/SLNB Barry Dienes):  Multifocal invasive ductal carcinoma (0.5 cm, 1.4 cm), DCIS, 2/2 SLN were positive.   05/08/2014 Pathologic Stage Stage IIA: ympT1c pN1a    07/10/2014 - 08/23/2014 Radiation Therapy Adjuvant RT Valere Dross): Left chest wall/regional lymph nodes 50.4 Gy over 28 fractions; left chest wall boost 10 Gy over 5 fractions   08/22/2014 Procedure port removal. Tip culture (+) Pseudomonas.    09/2014 -  Anti-estrogen oral therapy Anastrozole 1 mg daily.  Planned duration of therapy 10 years [facility] Medico).   01/16/2015 Survivorship Survivorship visit completed and copy of care plan provided to patient.     CURRENT THERAPY: Anastrozole 1 mg once daily, started in June 2016.  INTERVAL HISTORY: Krislynn returns for follow up. She is taking anastrozole daily, no hot flush or other side effects. She is doing well overall. She has good appetite and eats well, she exercise 6 days a week, she participates "living well and strong" Exercise program. She is very comfortable about her diet, try to avoid any soy products, and eat healthy. She denies any new pain, dyspnea, or other symptoms.  REVIEW OF SYSTEMS:   Constitutional: Denies fevers, chills.  Eyes: Denies blurriness of vision Ears, nose, mouth, throat, and face: Denies mucositis or sore throat Respiratory: Denies cough, dyspnea or wheezes Cardiovascular: Denies palpitation, chest discomfort or lower extremity swelling Gastrointestinal:  Denies nausea, heartburn or change in bowel habits Skin: Denies abnormal skin rashes except a blister at  the old port site Lymphatics: Denies new lymphadenopathy or easy bruising Neurological:Denies numbness, tingling or new weaknesses Behavioral/Psych: Mood is stable, no new changes  All other systems were reviewed with the patient and are negative except those  in history   MEDICAL HISTORY:  Past Medical History  Diagnosis Date  . Hypertension   . Alopecia areata   . Status post chemotherapy 11/26/2013 - 03/11/2014    6 cycles of neoadjuvant chemotherapy with TCH-P (Taxotere, Carbolatin, Herceptin and Perjeta followed by Neulasta  . Hyperlipidemia   . Type 2 diabetes, diet controlled ([place])   . History of endometriosis   . Infection due to port-a-cath     left chest area w/ cellulitis and skin ulcer   . Breast cancer [facility]) oncologist-  dr Krista Blue Alyce Inscore/  dx 07/ 2015--  DCIS left breast cT3, N0, Stage IIB  triple positive ,  ER+PR+HER2+/       s/p  chemotherapy 11-26-2013 to 03-11-2014 (after chemo, yT1cN1a)--  s/p  left mastectomy w/ SLN bx 05-08-2014/  radiation therapy currently started 07-10-2014  . Anemia due to chemotherapy   . 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 S/P radiation therapy 07/10/2014 through 08/23/2014     Left chest wall/regional lymph nodes 5040 cGy in 28 sessions, left chest wall boost 1000 cGy in 5 sessions     SURGICAL HISTORY: Past Surgical History  Procedure Laterality Date  . Colonoscopy    . Portacath placement N/A 11/20/2013    Procedure: INSERTION PORT-A-CATH;  Surgeon: [name], MD;  Location: [place];  Service: General;  Laterality: N/A;  . Breast surgery Left 7/15    bx  . Mastectomy w/ sentinel node biopsy Left 05/08/2014    Procedure: LEFT MASTECTOMY WITH SENTINEL LYMPH NODE BIOPSY;  Surgeon: [name], MD;  Location: [place];  Service: General;  Laterality: Left;  . Transthoracic echocardiogram  05-30-2014    grade I diastolic dysfunction/ ef 67-12%/  moderate diffuse calcification AV without stenosis/  trivial MR and TR  . Abdominal hysterectomy  1995    and 1996 Laparosopic Bilateral Salpingoophorectomy  . Port-a-cath removal Left 08/22/2014    Procedure: REMOVAL PORT-A-CATH;  Surgeon: [name], MD;   Location: [facility];  Service: General;  Laterality: Left;    I have reviewed the social history and family history with the patient and they are unchanged from previous note.  ALLERGIES:  is allergic to eggs or egg-derived products; nickel; sulfa antibiotics; neosporin; multihance; penicillins; and tetanus toxoids.  MEDICATIONS:    Medication List       This list is accurate as of: 06/17/15 11:27 AM.  Always use your most recent med list.               acetaminophen 500 MG tablet  Commonly known as:  TYLENOL  Take 500 mg by mouth every 6 (six) hours as needed (pain).     anastrozole 1 MG tablet  Commonly known as:  ARIMIDEX  Take 1 tablet (1 mg total) by mouth daily.     calcium citrate-vitamin D 315-200 MG-UNIT tablet  Commonly known as:  CITRACAL+D  Take 1 tablet by mouth daily.     clobetasol 0.05 % external solution  Commonly known as:  TEMOVATE  Apply 1 application topically as needed.     Fish Oil 1000 MG Caps  Take 1 capsule by mouth daily.     Garlic 4580 MG Caps  Take 1 capsule  by mouth daily.     hydrochlorothiazide 25 MG tablet  Commonly known as:  HYDRODIURIL  Take 25 mg by mouth every morning.     minoxidil 2 % external solution  Commonly known as:  ROGAINE  Apply 5 % topically as needed.     multivitamin with minerals Tabs tablet  Take 1 tablet by mouth daily.         PHYSICAL EXAMINATION: ECOG PERFORMANCE STATUS: 0 BP 131/67 mmHg  Pulse 68  Temp(Src) 98 F (36.7 C) (Oral)  Resp 18  Ht _0  (1.651 m)  Wt 166 lb 6.4 oz (75.479 kg)  BMI 27.69 kg/m2  SpO2 100% GENERAL:alert, no distress and comfortable SKIN: skin color, texture, turgor are normal, no rashes or significant lesions except (+) small confluent folliculitis/abscess at the previous port site on left upper chest. (+) skin pigmentation at the left mastectomy site secondary to radiation. EYES: normal, Conjunctiva are pink and non-injected, sclera  clear OROPHARYNX:no exudate, no erythema and lips, buccal mucosa, and tongue normal  NECK: supple, thyroid normal size, non-tender, without nodularity LYMPH:  no palpable lymphadenopathy in the cervical, axillary or inguinal LUNGS: clear to auscultation and percussion with normal breathing effort HEART: regular rate & rhythm and no murmurs and no lower extremity edema ABDOMEN:abdomen soft, non-tender and normal bowel sounds Musculoskeletal:no cyanosis of digits and no clubbing  NEURO: alert & oriented x 3 with fluent speech, no focal motor/sensory deficits Breasts: Status post left mastectomy, surgical incision has well healed, small scar at the previous port site.  (+) skin pigmentation secondary to radiation. No palpable mass on the left chest wall or axilla. Palpation of the right breasts and axilla revealed no obvious mass that I could appreciate. EXTREMITIES: no edema   LABORATORY DATA:  I have reviewed the data as listed CBC Latest Ref Rng 06/17/2015 03/03/2015 11/26/2014  WBC 3.9 - 10.3 10e3/uL 3.3(L) 4.2 3.7(L)  Hemoglobin 11.6 - 15.9 g/dL 12.5 12.9 12.0  Hematocrit 34.8 - 46.6 % 37.4 38.7 36.5  Platelets 145 - 400 10e3/uL 152 156 145     CMP Latest Ref Rng 06/17/2015 03/03/2015 11/26/2014  Glucose 70 - 140 mg/dl 94 92 87  BUN 7.0 - 26.0 mg/dL 16.2 18.6 21.2  Creatinine 0.6 - 1.1 mg/dL 0.9 0.9 1.0  Sodium 136 - 145 mEq/L 143 144 147(H)  Potassium 3.5 - 5.1 mEq/L 4.1 4.2 3.9  CO2 22 - 29 mEq/L 32(H) 30(H) 28  Calcium 8.4 - 10.4 mg/dL 9.5 9.6 9.5  Total Protein 6.4 - 8.3 g/dL 6.8 7.0 6.5  Total Bilirubin 0.20 - 1.20 mg/dL 0.50 0.85 0.63  Alkaline Phos 40 - 150 U/L 80 65 64  AST 5 - 34 U/L _1 ALT 0 - 55 U/L _2 PATHOLOGY REPORT: Diagnosis 05/08/2014 1. Breast, simple mastectomy, left  - PENDING STAINS. - MULTIFOCAL INVASIVE DUCTAL CARCINOMA WITH NEOADJUVANT RELATED CHANGES, SEE COMMENT. - NEGATIVE FOR LYMPH VASCULAR INVASION. - INVASIVE TUMOR IS 4 MM FROM  NEAREST MARGIN (ANTERIOR). - DUCTAL CARCINOMA IN SITU - PREVIOUS BIOPSY SITE. - BENIGN SKIN; NEGATIVE FOR TUMOR - SEE TUMOR SYNOPTIC TEMPLATE BELOW. 2. Lymph node, sentinel, biopsy, left axillary #1 - ONE LYMPH NODE, POSITIVE FOR METASTATIC MAMMARY CARCINOMA (1/1). - INTRANODAL TUMOR DEPOSIT IS 2 MM - NEGATIVE FOR EXTRACAPSULAR TUMOR EXTENSION. 3. Lymph node, sentinel, biopsy, left axillary #2 - ONE LYMPH NODE, POSITIVE FOR METASTATIC MAMMARY CARCINOMA (1/1). - INTRANODAL TUMOR DEPOSIT IS 4 MM - NEGATIVE FOR  EXTRACAPSULAR TUMOR EXTENSION.  Microscopic Comment 1. BREAST, INVASIVE TUMOR, WITH LYMPH NODES PRESENT Specimen, including laterality and lymph node sampling (sentinel, non-sentinel): Left breast Procedure: Simple mastectomy Histologic type: Ductal (x3 tumors), see comment. Grade: II of III (Tumor #1 and 2), see comment Tubule formation: 2 Nuclear pleomorphism: 3 Mitotic: 2 Grade: I of III (Tumor #3), see comment Tubule formation: 1 Nuclear pleomorphism: 1 Mitotic: 1 1 of 4 FINAL for ADAMS-FOUST, Victoriya E ([id number]) Microscopic Comment(continued) Tumor size (glass slide measurement): 0.5 cm and 1.4 cm; gross measurement 2.0 cm, see comment. Margins: Invasive, distance to closest margin: 4 mm In-situ, distance to closest margin: 4 mm (anterior) If margin positive, focally or broadly: N/A Lymphovascular invasion: Absent Ductal carcinoma in situ: Present Grade: III of III Extensive intraductal component: Absent Lobular neoplasia: Pending stains Tumor focality: Multifocal Treatment effect: Present If present, treatment effect in breast tissue, lymph nodes or both: Tissue Extent of tumor: Skin: Negative Nipple: Negative Skeletal muscle: Negative Lymph nodes: Examined: 2 Sentinel 0 Non-sentinel 2 Total Lymph nodes with metastasis: 2 Isolated tumor cells (< 0.2 mm): 0 Micrometastasis: (> 0.2 mm and < 2.0 mm): 0 Macrometastasis: (> 2.0 mm): X 2 Extracapsular  extension: Absent Breast prognostic profile: Estrogen receptor: Not repeated, previous study demonstrated 100% positivity and 100% positivity ([id number] and [id number]) Progesterone receptor: Not repeated, previous study demonstrated 67%% and 42% positivity ([id number] and [id number]) Her 2 neu: Not repeated, previous study demonstrated amplification ([id number] and [id number]) Ki-67: Not repeated, previous study demonstrated 12% and 53% proliferation rate ([id number] and [id number]) Non-neoplastic breast: Previous biopsy site; fibrocystic change, usual ductal hyperplasia, calcifications and neoadjuvant related tissue changes. TNM: ympT1c, pN1a, pMX Comments: There are three different mass areas identified. The first central mass associated with barbell shaped clip demonstrates both in situ and invasive ductal carcinoma. Within the focus, the invasive ductal carcinoma spans 5 mm. The second posterior medial mass demonstrates both in situ and invasive ductal carcinoma spanning the entire 2.0 cm grossly identified stellate area. The third and final lateral mass with associated heart shaped clip demonstrates both in situ and invasive ductal carcinoma. The invasive tumor spans 1.4cm and is involved by and away from previous biopsy site. The lack of the myoepithelial layer was confirmed with smooth muscle myosin heavy chain, Calponin, and p63 immunostains (slide 1K).. The presence of strong diffuse E-Cadherin expression and absence of cytokeratin 5/6 expression supports a ductal phenotype to the in situ carcinoma (slide 1K). (CR:kh 05-09-14) [place] RUND DO Pathologist, Electronic Signature (Case signed 05/13/2014) Specimen Gross and Clinical Information  RADIOGRAPHIC STUDIES: I have personally reviewed the radiological images as listed and agreed with the findings in the report.  ECHO 11/12/2014  Study Conclusions  - Left ventricle: The cavity size was normal. Wall thickness  was increased in a pattern of mild LVH. There was focal basal hypertrophy. Systolic function was normal. The estimated ejection fraction was in the range of 60% to 65%. Wall motion was normal; there were no regional wall motion abnormalities. Doppler parameters are consistent with abnormal left ventricular relaxation (grade 1 diastolic dysfunction).   Mammogram 11/20/2014 IMPRESSION: No mammographic evidence of malignancy. A result letter of this screening mammogram will be mailed directly to the patient.  RECOMMENDATION: Screening mammogram in one year. ([id number])   ASSESSMENT & PLAN:  This is a very pleasant 60 years-old African [place] female from [facility] who presents with a left Invasive Ductal Carcinoma. This was picked on a mammogram and maximal diameter was 1.7 cm on Ultrasound  however the MRI breast showed this mass to be large almost 5.1 cm in maximal dimension making it a clinical T3 tumor. Also it showed a suspicious looking solitary lymph node in axilla but [place] was negative, not biopsied.  Clinical T3N0M0 Stage IIB.  The tumor biology indicates a LUMINAL-B type which means ER/PR/HER2 + tumor. Tumor is strongly ER+100%, PR 42%,KI-67 Index 53% showing a brisk mitotic rate and grade is called grade 2 (but grading as per pathologist cannot be relied upon on initial biopsy).   1. 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  -I discussed her surgical pathology findings with her in details. Unfortunately she had metastasis to 2 Sentinel lymph nodes. Axillary lymph node dissection was discussed, Dr. Barry Dienes, Dr. Valere Dross and me and we recommend to proceed ALND, which is the current standard care. She however declined surgery due to the concern of lymphedema. She opted adjuvant radiation including the  axillary area.  -She is now on adjuvant anastrozole, tolerating well, we'll continue. Recent clinical data has shown to extended aromatase inhibitor for 10 years has decreased cancer recurrence, given her advanced stage and relative young age, I recommend her to take it for total of 10 years. -We discussed breast cancer surveillance, with mammogram once a year, close follow-up every 3-4 months for the next 2-3 years then every 6-12 months -She is clinically doing very well, I reviewed her lab results today, her physical exam was unremarkable today. No evidence for recurrence so far.  -I encouraged her to continue healthy diet and exercise regularly. She is doing very good job on that. She had many questions about her diet, especially sore products, I discussed with her in details.  2. Bone health  - I encouraged her to continue taking calcium and vitamin D -We discussed the potential side effects of osteoporosis from anastrozole -Her last bone density scan was done on 11/19/2014, which showed normal bone density.    3. HTN, DM -Follow up with primary care physician  Plan -Continue anastrozole -Return to clinic in 3 months with lab -I will schedule her next diagnostic right-sided mammogram in August  All questions were answered. The patient knows to call the clinic with any problems, questions or concerns. No barriers to learning was detected.  I spent 20 minutes counseling the patient face to face. The total time spent in the appointment was 25 minutes and more than 50% was on counseling and review of test results     Truitt Merle, MD 06/17/2015   11:27 AM

## 2015-06-17 NOTE — Telephone Encounter (Signed)
Gave and prtned appt sched and avs for pt for June

## 2015-06-18 ENCOUNTER — Other Ambulatory Visit: Payer: Self-pay | Admitting: Hematology

## 2015-06-18 DIAGNOSIS — Z1231 Encounter for screening mammogram for malignant neoplasm of breast: Secondary | ICD-10-CM

## 2015-08-11 ENCOUNTER — Other Ambulatory Visit: Payer: Self-pay | Admitting: General Surgery

## 2015-09-03 ENCOUNTER — Encounter: Payer: Self-pay | Admitting: Adult Health

## 2015-09-03 NOTE — Progress Notes (Signed)
A birthday card was mailed to the patient today on behalf of the Survivorship Program at [facility].   Gretchen Dawson, NP Survivorship Program [facility] [phone]  

## 2015-09-09 ENCOUNTER — Telehealth: Payer: Self-pay | Admitting: Hematology

## 2015-09-09 ENCOUNTER — Other Ambulatory Visit (HOSPITAL_BASED_OUTPATIENT_CLINIC_OR_DEPARTMENT_OTHER): Payer: Managed Care, Other (non HMO)

## 2015-09-09 ENCOUNTER — Encounter: Payer: Self-pay | Admitting: Hematology

## 2015-09-09 ENCOUNTER — Ambulatory Visit (HOSPITAL_BASED_OUTPATIENT_CLINIC_OR_DEPARTMENT_OTHER): Payer: Managed Care, Other (non HMO) | Admitting: Hematology

## 2015-09-09 VITALS — BP 118/63 | HR 68 | Temp 98.7°F | Resp 18 | Ht 65.0 in | Wt 167.2 lb

## 2015-09-09 DIAGNOSIS — Z79811 Long term (current) use of aromatase inhibitors: Secondary | ICD-10-CM

## 2015-09-09 DIAGNOSIS — C50412 Malignant neoplasm of upper-outer quadrant of left female breast: Secondary | ICD-10-CM

## 2015-09-09 DIAGNOSIS — Z170 Estrogen receptor positive status [ER+]: Secondary | ICD-10-CM | POA: Diagnosis not present

## 2015-09-09 DIAGNOSIS — I10 Essential (primary) hypertension: Secondary | ICD-10-CM

## 2015-09-09 DIAGNOSIS — E119 Type 2 diabetes mellitus without complications: Secondary | ICD-10-CM

## 2015-09-09 LAB — CBC WITH DIFFERENTIAL/PLATELET
BASO%: 0.5 % (ref 0.0–2.0)
BASOS ABS: 0 10*3/uL (ref 0.0–0.1)
EOS%: 2.2 % (ref 0.0–7.0)
Eosinophils Absolute: 0.1 10*3/uL (ref 0.0–0.5)
HCT: 37.6 % (ref 34.8–46.6)
HGB: 12.6 g/dL (ref 11.6–15.9)
LYMPH%: 31.3 % (ref 14.0–49.7)
MCH: 29.7 pg (ref 25.1–34.0)
MCHC: 33.5 g/dL (ref 31.5–36.0)
MCV: 88.7 fL (ref 79.5–101.0)
MONO#: 0.5 10*3/uL (ref 0.1–0.9)
MONO%: 13.7 % (ref 0.0–14.0)
NEUT#: 1.9 10*3/uL (ref 1.5–6.5)
NEUT%: 52.3 % (ref 38.4–76.8)
Platelets: 148 10*3/uL (ref 145–400)
RBC: 4.24 10*6/uL (ref 3.70–5.45)
RDW: 14.5 % (ref 11.2–14.5)
WBC: 3.6 10*3/uL — ABNORMAL LOW (ref 3.9–10.3)
lymph#: 1.1 10*3/uL (ref 0.9–3.3)

## 2015-09-09 LAB — COMPREHENSIVE METABOLIC PANEL
ALBUMIN: 3.7 g/dL (ref 3.5–5.0)
ALK PHOS: 80 U/L (ref 40–150)
ALT: 15 U/L (ref 0–55)
AST: 18 U/L (ref 5–34)
Anion Gap: 6 mEq/L (ref 3–11)
BUN: 16.7 mg/dL (ref 7.0–26.0)
CO2: 32 mEq/L — ABNORMAL HIGH (ref 22–29)
Calcium: 9.6 mg/dL (ref 8.4–10.4)
Chloride: 103 mEq/L (ref 98–109)
Creatinine: 0.9 mg/dL (ref 0.6–1.1)
EGFR: 79 mL/min/{1.73_m2} — ABNORMAL LOW (ref >90)
GLUCOSE: 87 mg/dL (ref 70–140)
Potassium: 4.1 mEq/L (ref 3.5–5.1)
SODIUM: 141 meq/L (ref 136–145)
Total Bilirubin: 0.67 mg/dL (ref 0.20–1.20)
Total Protein: 7 g/dL (ref 6.4–8.3)

## 2015-09-09 NOTE — Progress Notes (Signed)
[place] OFFICE PROGRESS NOTE  Patient Care Team: Seward Carol, MD as PCP - General (Internal Medicine) [name], MD as Consulting Physician (General Surgery) Truitt Merle, MD as Consulting Physician (Hematology) Arloa Koh, MD as Consulting Physician (Radiation Oncology) Sylvan Cheese, NP as Nurse Practitioner (Hematology and Oncology)  SUMMARY OF ONCOLOGIC HISTORY: Oncology History    Breast cancer of upper-outer quadrant of left female breast   Staging form: Breast, AJCC 7th Edition     Clinical: Stage IIB (T3, N0, cM0) - Unsigned       Staging comments: Staged at breast conference 8.5.15      Pathologic stage from 05/08/2014: Stage IIA (yT1c(m), N1a, cM0) - Signed by Seward Grater, MD on 05/15/2014       Staging comments: Staged on final mastectomy specimen by Dr. Donato Heinz        Breast cancer of upper-outer quadrant of left female breast ([employer])   10/17/2013 Mammogram Left breast: persistent distortion in the UOQ, corresponding to the screening mammographic finding.    10/26/2013 Breast US Left breast: hypoechoic irregular mass with surrounding distortion in the left breast 2 o'clock location 4 cm from the nipple measuring 1.1 x 0.9 x 0.7 cm. This corresponds to the mammographic finding. No left axillary LAD   10/30/2013 Initial Biopsy Left breast core needle bx: Invasive ductal carcinoma, grade 2, ER+ (100%), PR+ (42%), HER2neu positive (ratio 2.8), Ki67 53%, DCIS with calcifications   11/05/2013 Breast MRI Irregular enhancing mass measuring 5.1 cm in the upper and upper-outer quadrants of the left breast. The MR enhancing mass is much larger than the mass seen mammographically or sonographically.    11/05/2013 Breast MRI Suspicious level 1 left axillary lymph node concerning for metastatic involving. But [place] negative, no node biopsy.    11/07/2013 Clinical Stage Stage IIB: T2 N0 cM0   11/26/2013 - 11/26/2014 Neo-Adjuvant Chemotherapy docetaxel, carboplatin, pertuzumab,  and transtuzumab x 6 cycles (11/26/13-03/11/14) followed by mainteance trastuzumab (04/08/14-11/26/14)  to complete 1 year of therapy   05/08/2014 Surgery Left mastectomy/SLNB Barry Dienes):  Multifocal invasive ductal carcinoma (0.5 cm, 1.4 cm), DCIS, 2/2 SLN were positive.   05/08/2014 Pathologic Stage Stage IIA: ympT1c pN1a    07/10/2014 - 08/23/2014 Radiation Therapy Adjuvant RT Valere Dross): Left chest wall/regional lymph nodes 50.4 Gy over 28 fractions; left chest wall boost 10 Gy over 5 fractions   08/22/2014 Procedure port removal. Tip culture (+) Pseudomonas.    09/2014 -  Anti-estrogen oral therapy Anastrozole 1 mg daily.  Planned duration of therapy 10 years Burr Medico).   01/16/2015 Survivorship Survivorship visit completed and copy of care plan provided to patient.     CURRENT THERAPY: Anastrozole 1 mg once daily, started in June 2016.  INTERVAL HISTORY: Estle returns for follow up. She Is doing well overall. She remains to be physically active, exercise every day. She also watches her diet. She has gained a few pounds since her last visit. She takes anastrozole daily, no significant hot flashes or other side effects. She has good appetite and eating well, no other complaints.  REVIEW OF SYSTEMS:   Constitutional: Denies fevers, chills.  Eyes: Denies blurriness of vision Ears, nose, mouth, throat, and face: Denies mucositis or sore throat Respiratory: Denies cough, dyspnea or wheezes Cardiovascular: Denies palpitation, chest discomfort or lower extremity swelling Gastrointestinal:  Denies nausea, heartburn or change in bowel habits Skin: Denies abnormal skin rashes except a blister at the old port site Lymphatics: Denies new lymphadenopathy or easy bruising Neurological:Denies  numbness, tingling or new weaknesses Behavioral/Psych: Mood is stable, no new changes  All other systems were reviewed with the patient and are negative except those in history   MEDICAL HISTORY:  Past Medical History   Diagnosis Date  . Hypertension   . Alopecia areata   . Status post chemotherapy 11/26/2013 - 03/11/2014    6 cycles of neoadjuvant chemotherapy with TCH-P (Taxotere, Carbolatin, Herceptin and Perjeta followed by Neulasta  . Hyperlipidemia   . Type 2 diabetes, diet controlled ([place])   . History of endometriosis   . Infection due to port-a-cath     left chest area w/ cellulitis and skin ulcer   . Breast cancer [facility]) oncologist-  dr Krista Blue Shaylene Paganelli/  dx 07/ 2015--  DCIS left breast cT3, N0, Stage IIB  triple positive ,  ER+PR+HER2+/       s/p  chemotherapy 11-26-2013 to 03-11-2014 (after chemo, yT1cN1a)--  s/p  left mastectomy w/ SLN bx 05-08-2014/  radiation therapy currently started 07-10-2014  . Anemia due to chemotherapy   . 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 S/P radiation therapy 07/10/2014 through 08/23/2014     Left chest wall/regional lymph nodes 5040 cGy in 28 sessions, left chest wall boost 1000 cGy in 5 sessions     SURGICAL HISTORY: Past Surgical History  Procedure Laterality Date  . Colonoscopy    . Portacath placement N/A 11/20/2013    Procedure: INSERTION PORT-A-CATH;  Surgeon: [name], MD;  Location: [place];  Service: General;  Laterality: N/A;  . Breast surgery Left 7/15    bx  . Mastectomy w/ sentinel node biopsy Left 05/08/2014    Procedure: LEFT MASTECTOMY WITH SENTINEL LYMPH NODE BIOPSY;  Surgeon: [name], MD;  Location: [place];  Service: General;  Laterality: Left;  . Transthoracic echocardiogram  05-30-2014    grade I diastolic dysfunction/ ef 31-54%/  moderate diffuse calcification AV without stenosis/  trivial MR and TR  . Abdominal hysterectomy  1995    and 1996 Laparosopic Bilateral Salpingoophorectomy  . Port-a-cath removal Left 08/22/2014    Procedure: REMOVAL PORT-A-CATH;  Surgeon: [name], MD;  Location: [facility];  Service: General;   Laterality: Left;    I have reviewed the social history and family history with the patient and they are unchanged from previous note.  ALLERGIES:  is allergic to eggs or egg-derived products; nickel; sulfa antibiotics; neosporin; multihance; penicillins; and tetanus toxoids.  MEDICATIONS:    Medication List       This list is accurate as of: 09/09/15  9:36 AM.  Always use your most recent med list.               acetaminophen 500 MG tablet  Commonly known as:  TYLENOL  Take 500 mg by mouth every 6 (six) hours as needed (pain).     anastrozole 1 MG tablet  Commonly known as:  ARIMIDEX  Take 1 tablet (1 mg total) by mouth daily.     calcium citrate-vitamin D 315-200 MG-UNIT tablet  Commonly known as:  CITRACAL+D  Take 1 tablet by mouth every other day.     clobetasol 0.05 % external solution  Commonly known as:  TEMOVATE  Apply 1 application topically as needed.     Fish Oil 1000 MG Caps  Take 1 capsule by mouth daily.     Garlic 0086 MG Caps  Take 1 capsule by mouth daily.     hydrochlorothiazide 25  MG tablet  Commonly known as:  HYDRODIURIL  Take 25 mg by mouth every morning.     multivitamin with minerals Tabs tablet  Take 1 tablet by mouth daily.         PHYSICAL EXAMINATION: ECOG PERFORMANCE STATUS: 0 BP 118/63 mmHg  Pulse 68  Temp(Src) 98.7 F (37.1 C) (Oral)  Resp 18  Ht '5\' 5"'  (1.651 m)  Wt 167 lb 3.2 oz (75.841 kg)  BMI 27.82 kg/m2  SpO2 100% GENERAL:alert, no distress and comfortable SKIN: skin color, texture, turgor are normal, no rashes or significant lesions except (+) small confluent folliculitis/abscess at the previous port site on left upper chest. (+) skin pigmentation at the left mastectomy site secondary to radiation. EYES: normal, Conjunctiva are pink and non-injected, sclera clear OROPHARYNX:no exudate, no erythema and lips, buccal mucosa, and tongue normal  NECK: supple, thyroid normal size, non-tender, without nodularity LYMPH:  no  palpable lymphadenopathy in the cervical, axillary or inguinal LUNGS: clear to auscultation and percussion with normal breathing effort HEART: regular rate & rhythm and no murmurs and no lower extremity edema ABDOMEN:abdomen soft, non-tender and normal bowel sounds Musculoskeletal:no cyanosis of digits and no clubbing  NEURO: alert & oriented x 3 with fluent speech, no focal motor/sensory deficits Breasts: Status post left mastectomy, surgical incision has well healed, small scar at the previous port site.  (+) skin pigmentation secondary to radiation. No palpable mass on the left chest wall or axilla. Palpation of the right breasts and axilla revealed no obvious mass that I could appreciate. EXTREMITIES: no edema   LABORATORY DATA:  I have reviewed the data as listed CBC Latest Ref Rng 09/09/2015 06/17/2015 03/03/2015  WBC 3.9 - 10.3 10e3/uL 3.6(L) 3.3(L) 4.2  Hemoglobin 11.6 - 15.9 g/dL 12.6 12.5 12.9  Hematocrit 34.8 - 46.6 % 37.6 37.4 38.7  Platelets 145 - 400 10e3/uL 148 152 156     CMP Latest Ref Rng 09/09/2015 06/17/2015 03/03/2015  Glucose 70 - 140 mg/dl 87 94 92  BUN 7.0 - 26.0 mg/dL 16.7 16.2 18.6  Creatinine 0.6 - 1.1 mg/dL 0.9 0.9 0.9  Sodium 136 - 145 mEq/L 141 143 144  Potassium 3.5 - 5.1 mEq/L 4.1 4.1 4.2  CO2 22 - 29 mEq/L 32(H) 32(H) 30(H)  Calcium 8.4 - 10.4 mg/dL 9.6 9.5 9.6  Total Protein 6.4 - 8.3 g/dL 7.0 6.8 7.0  Total Bilirubin 0.20 - 1.20 mg/dL 0.67 0.50 0.85  Alkaline Phos 40 - 150 U/L 80 80 65  AST 5 - 34 U/L '18 18 18  ' ALT 0 - 55 U/L '15 14 13   ' PATHOLOGY REPORT: Diagnosis 05/08/2014 1. Breast, simple mastectomy, left  - PENDING STAINS. - MULTIFOCAL INVASIVE DUCTAL CARCINOMA WITH NEOADJUVANT RELATED CHANGES, SEE COMMENT. - NEGATIVE FOR LYMPH VASCULAR INVASION. - INVASIVE TUMOR IS 4 MM FROM NEAREST MARGIN (ANTERIOR). - DUCTAL CARCINOMA IN SITU - PREVIOUS BIOPSY SITE. - BENIGN SKIN; NEGATIVE FOR TUMOR - SEE TUMOR SYNOPTIC TEMPLATE BELOW. 2. Lymph node,  sentinel, biopsy, left axillary #1 - ONE LYMPH NODE, POSITIVE FOR METASTATIC MAMMARY CARCINOMA (1/1). - INTRANODAL TUMOR DEPOSIT IS 2 MM - NEGATIVE FOR EXTRACAPSULAR TUMOR EXTENSION. 3. Lymph node, sentinel, biopsy, left axillary #2 - ONE LYMPH NODE, POSITIVE FOR METASTATIC MAMMARY CARCINOMA (1/1). - INTRANODAL TUMOR DEPOSIT IS 4 MM - NEGATIVE FOR EXTRACAPSULAR TUMOR EXTENSION.  Microscopic Comment 1. BREAST, INVASIVE TUMOR, WITH LYMPH NODES PRESENT Specimen, including laterality and lymph node sampling (sentinel, non-sentinel): Left breast Procedure: Simple mastectomy Histologic type: Ductal (  x3 tumors), see comment. Grade: II of III (Tumor #1 and 2), see comment Tubule formation: 2 Nuclear pleomorphism: 3 Mitotic: 2 Grade: I of III (Tumor #3), see comment Tubule formation: 1 Nuclear pleomorphism: 1 Mitotic: 1 1 of 4 FINAL for ADAMS-FOUST, Raghad E ([id number]) Microscopic Comment(continued) Tumor size (glass slide measurement): 0.5 cm and 1.4 cm; gross measurement 2.0 cm, see comment. Margins: Invasive, distance to closest margin: 4 mm In-situ, distance to closest margin: 4 mm (anterior) If margin positive, focally or broadly: N/A Lymphovascular invasion: Absent Ductal carcinoma in situ: Present Grade: III of III Extensive intraductal component: Absent Lobular neoplasia: Pending stains Tumor focality: Multifocal Treatment effect: Present If present, treatment effect in breast tissue, lymph nodes or both: Tissue Extent of tumor: Skin: Negative Nipple: Negative Skeletal muscle: Negative Lymph nodes: Examined: 2 Sentinel 0 Non-sentinel 2 Total Lymph nodes with metastasis: 2 Isolated tumor cells (< 0.2 mm): 0 Micrometastasis: (> 0.2 mm and < 2.0 mm): 0 Macrometastasis: (> 2.0 mm): X 2 Extracapsular extension: Absent Breast prognostic profile: Estrogen receptor: Not repeated, previous study demonstrated 100% positivity and 100% positivity ([id number] and  [id number]) Progesterone receptor: Not repeated, previous study demonstrated 67%% and 42% positivity ([id number] and [id number]) Her 2 neu: Not repeated, previous study demonstrated amplification ([id number] and [id number]) Ki-67: Not repeated, previous study demonstrated 12% and 53% proliferation rate ([id number] and [id number]) Non-neoplastic breast: Previous biopsy site; fibrocystic change, usual ductal hyperplasia, calcifications and neoadjuvant related tissue changes. TNM: ympT1c, pN1a, pMX Comments: There are three different mass areas identified. The first central mass associated with barbell shaped clip demonstrates both in situ and invasive ductal carcinoma. Within the focus, the invasive ductal carcinoma spans 5 mm. The second posterior medial mass demonstrates both in situ and invasive ductal carcinoma spanning the entire 2.0 cm grossly identified stellate area. The third and final lateral mass with associated heart shaped clip demonstrates both in situ and invasive ductal carcinoma. The invasive tumor spans 1.4cm and is involved by and away from previous biopsy site. The lack of the myoepithelial layer was confirmed with smooth muscle myosin heavy chain, Calponin, and p63 immunostains (slide 1K).. The presence of strong diffuse E-Cadherin expression and absence of cytokeratin 5/6 expression supports a ductal phenotype to the in situ carcinoma (slide 1K). (CR:kh 05-09-14) [place] RUND DO Pathologist, Electronic Signature (Case signed 05/13/2014) Specimen Gross and Clinical Information  RADIOGRAPHIC STUDIES: I have personally reviewed the radiological images as listed and agreed with the findings in the report.  ECHO 11/12/2014  Study Conclusions  - Left ventricle: The cavity size was normal. Wall thickness was increased in a pattern of mild LVH. There was focal basal hypertrophy. Systolic function was normal. The estimated ejection fraction was in the range of  60% to 65%. Wall motion was normal; there were no regional wall motion abnormalities. Doppler parameters are consistent with abnormal left ventricular relaxation (grade 1 diastolic dysfunction).   Mammogram 11/20/2014 IMPRESSION: No mammographic evidence of malignancy. A result letter of this screening mammogram will be mailed directly to the patient.  RECOMMENDATION: Screening mammogram in one year. ([id number])   ASSESSMENT & PLAN:  This is a very pleasant 60 years-old African [place] female from [facility] who presents with a left Invasive Ductal Carcinoma. This was picked on a mammogram and maximal diameter was 1.7 cm on Ultrasound however the MRI breast showed this mass to be large almost 5.1 cm in maximal dimension making it a clinical T3 tumor. Also it showed a suspicious looking solitary lymph node  in axilla but [place] was negative, not biopsied.  Clinical T3N0M0 Stage IIB.  The tumor biology indicates a LUMINAL-B type which means ER/PR/HER2 + tumor. Tumor is strongly ER+100%, PR 42%,KI-67 Index 53% showing a brisk mitotic rate and grade is called grade 2 (but grading as per pathologist cannot be relied upon on initial biopsy).   1. 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  -I discussed her surgical pathology findings with her in details. Unfortunately she had metastasis to 2 Sentinel lymph nodes. Axillary lymph node dissection was discussed, Dr. Barry Dienes, Dr. Valere Dross and me and we recommend to proceed ALND, which is the current standard care. She however declined surgery due to the concern of lymphedema. She opted adjuvant radiation including the axillary area.  -She is now on adjuvant anastrozole, tolerating well, we'll continue. Recent clinical data has shown to extended aromatase inhibitor for 10 years has  decreased cancer recurrence, given her advanced stage and relative young age, I recommend her to take it for total of 10 years. -It has been 2 years since her initial diagnosis. She is clinically doing very well, I reviewed her lab results today, her physical exam was unremarkable today. No evidence for recurrence so far.  -I'll see her every 4 months for the next year, then every 6 months afterwards. We'll continue breast cancer surveillance with annual mammogram. -I encouraged her to continue healthy diet and exercise regularly. She is doing very good job on that.  2. Bone health  - I encouraged her to continue taking calcium and vitamin D -We discussed the potential side effects of osteoporosis from anastrozole -Her last bone density scan was done on 11/19/2014, which showed normal bone density.    3. HTN, DM -Follow up with primary care physician  Plan -Continue anastrozole -Return to clinic in 4 months with lab -She has scheduled him next screening right breast mammogram in August  All questions were answered. The patient knows to call the clinic with any problems, questions or concerns. No barriers to learning was detected.  I spent 20 minutes counseling the patient face to face. The total time spent in the appointment was 25 minutes and more than 50% was on counseling and review of test results     Truitt Merle, MD 09/09/2015   9:36 AM

## 2015-09-09 NOTE — Telephone Encounter (Signed)
Gave and pritned appt sched and avs for pt for OCT °

## 2015-09-26 ENCOUNTER — Other Ambulatory Visit: Payer: Self-pay | Admitting: *Deleted

## 2015-09-26 DIAGNOSIS — C50412 Malignant neoplasm of upper-outer quadrant of left female breast: Secondary | ICD-10-CM

## 2015-09-26 MED ORDER — ANASTROZOLE 1 MG PO TABS: 1.0000 mg | ORAL_TABLET | Freq: Every day | ORAL | Status: DC

## 2015-11-14 ENCOUNTER — Ambulatory Visit: Payer: Managed Care, Other (non HMO)

## 2015-11-18 ENCOUNTER — Ambulatory Visit
Admission: RE | Admit: 2015-11-18 | Discharge: 2015-11-18 | Disposition: A | Discharge status: Home / Self Care | Payer: Managed Care, Other (non HMO) | Source: Ambulatory Visit | Attending: Hematology | Admitting: Hematology

## 2015-11-18 DIAGNOSIS — Z1231 Encounter for screening mammogram for malignant neoplasm of breast: Secondary | ICD-10-CM

## 2015-11-25 ENCOUNTER — Telehealth: Payer: Self-pay | Admitting: *Deleted

## 2015-11-25 ENCOUNTER — Other Ambulatory Visit: Payer: Self-pay | Admitting: Hematology

## 2015-11-25 NOTE — Telephone Encounter (Signed)
Pt called requesting to know results of her mammogram done  11/19/15. Pt's    Cell   Phone      [phone].

## 2015-11-26 ENCOUNTER — Telehealth: Payer: Self-pay | Admitting: *Deleted

## 2015-11-26 NOTE — Telephone Encounter (Signed)
Spoke with pt and informed pt re:  Mammogram results -  No mammographic evidence of malignancy.   Pt voiced understanding.

## 2015-12-09 ENCOUNTER — Telehealth: Payer: Self-pay | Admitting: Hematology

## 2015-12-09 NOTE — Telephone Encounter (Signed)
01/20/2016 Appointment rescheduled to 01/28/2016 per patient request. Patient requested early appointment per availability.

## 2015-12-24 ENCOUNTER — Telehealth: Payer: Self-pay | Admitting: *Deleted

## 2015-12-24 NOTE — Telephone Encounter (Signed)
Patient left message.  Her last bone density was 11-19-14.  She is due to have her yearly physical with PCP.  Does Dr. Burr Medico want her to have yearly bone density exams?     Call back is [phone]  Or cell is [phone].

## 2015-12-25 NOTE — Telephone Encounter (Signed)
Spoke with patient.  Let her know that Dr. Burr Medico recommends bone density every two years so she is due next year.  She appreciated the call back.

## 2015-12-25 NOTE — Telephone Encounter (Signed)
Please let her know that we recommend bone density scan every two years. She will be due next year.   Truitt Merle MD

## 2016-01-20 ENCOUNTER — Ambulatory Visit: Payer: Managed Care, Other (non HMO) | Admitting: Hematology

## 2016-01-20 ENCOUNTER — Other Ambulatory Visit: Payer: Managed Care, Other (non HMO)

## 2016-01-28 ENCOUNTER — Ambulatory Visit (HOSPITAL_BASED_OUTPATIENT_CLINIC_OR_DEPARTMENT_OTHER): Payer: Managed Care, Other (non HMO) | Admitting: Hematology

## 2016-01-28 ENCOUNTER — Telehealth: Payer: Self-pay | Admitting: Hematology

## 2016-01-28 ENCOUNTER — Other Ambulatory Visit (HOSPITAL_BASED_OUTPATIENT_CLINIC_OR_DEPARTMENT_OTHER): Payer: Managed Care, Other (non HMO)

## 2016-01-28 ENCOUNTER — Encounter: Payer: Self-pay | Admitting: Hematology

## 2016-01-28 VITALS — BP 130/66 | HR 77 | Temp 98.4°F | Resp 18 | Ht 65.0 in | Wt 166.9 lb

## 2016-01-28 DIAGNOSIS — C50412 Malignant neoplasm of upper-outer quadrant of left female breast: Secondary | ICD-10-CM | POA: Diagnosis not present

## 2016-01-28 DIAGNOSIS — Z79811 Long term (current) use of aromatase inhibitors: Secondary | ICD-10-CM | POA: Diagnosis not present

## 2016-01-28 DIAGNOSIS — I10 Essential (primary) hypertension: Secondary | ICD-10-CM

## 2016-01-28 DIAGNOSIS — Z170 Estrogen receptor positive status [ER+]: Secondary | ICD-10-CM | POA: Diagnosis not present

## 2016-01-28 LAB — COMPREHENSIVE METABOLIC PANEL
ALT: 14 U/L (ref 0–55)
ANION GAP: 8 meq/L (ref 3–11)
AST: 17 U/L (ref 5–34)
Albumin: 3.6 g/dL (ref 3.5–5.0)
Alkaline Phosphatase: 95 U/L (ref 40–150)
BILIRUBIN TOTAL: 0.75 mg/dL (ref 0.20–1.20)
BUN: 15.9 mg/dL (ref 7.0–26.0)
CALCIUM: 9.6 mg/dL (ref 8.4–10.4)
CO2: 29 mEq/L (ref 22–29)
Chloride: 107 mEq/L (ref 98–109)
Creatinine: 0.9 mg/dL (ref 0.6–1.1)
EGFR: 83 mL/min/{1.73_m2} — ABNORMAL LOW (ref >90)
GLUCOSE: 107 mg/dL (ref 70–140)
POTASSIUM: 3.8 meq/L (ref 3.5–5.1)
SODIUM: 144 meq/L (ref 136–145)
TOTAL PROTEIN: 7 g/dL (ref 6.4–8.3)

## 2016-01-28 LAB — CBC WITH DIFFERENTIAL/PLATELET
BASO%: 0.9 % (ref 0.0–2.0)
BASOS ABS: 0 10*3/uL (ref 0.0–0.1)
EOS ABS: 0 10*3/uL (ref 0.0–0.5)
EOS%: 1 % (ref 0.0–7.0)
HEMATOCRIT: 39.2 % (ref 34.8–46.6)
HEMOGLOBIN: 12.6 g/dL (ref 11.6–15.9)
LYMPH%: 24.2 % (ref 14.0–49.7)
MCH: 28.9 pg (ref 25.1–34.0)
MCHC: 32 g/dL (ref 31.5–36.0)
MCV: 90.3 fL (ref 79.5–101.0)
MONO#: 0.3 10*3/uL (ref 0.1–0.9)
MONO%: 7.8 % (ref 0.0–14.0)
NEUT%: 66.1 % (ref 38.4–76.8)
NEUTROS ABS: 2.9 10*3/uL (ref 1.5–6.5)
PLATELETS: 171 10*3/uL (ref 145–400)
RBC: 4.34 10*6/uL (ref 3.70–5.45)
RDW: 14.8 % — AB (ref 11.2–14.5)
WBC: 4.4 10*3/uL (ref 3.9–10.3)
lymph#: 1.1 10*3/uL (ref 0.9–3.3)

## 2016-01-28 LAB — URINALYSIS, MICROSCOPIC - CHCC
BLOOD: NEGATIVE
Bilirubin (Urine): NEGATIVE
GLUCOSE UR CHCC: NEGATIVE mg/dL
Ketones: NEGATIVE mg/dL
NITRITE: NEGATIVE
PH: 5 (ref 4.6–8.0)
Protein: NEGATIVE mg/dL
SPECIFIC GRAVITY, URINE: 1.03 (ref 1.003–1.035)
UROBILINOGEN UR: 0.2 mg/dL (ref 0.2–1)

## 2016-01-28 MED ORDER — CIPROFLOXACIN HCL 500 MG PO TABS: 500.0000 mg | ORAL_TABLET | Freq: Two times a day (BID) | ORAL | 0 refills | Status: DC

## 2016-01-28 MED ORDER — CIPROFLOXACIN HCL 500 MG PO TABS
500.0000 mg | ORAL_TABLET | Freq: Two times a day (BID) | ORAL | 0 refills | Status: DC
Start: 2016-01-28 — End: 2016-01-28

## 2016-01-28 NOTE — Progress Notes (Addendum)
[place] OFFICE PROGRESS NOTE  Patient Care Team: Seward Carol, MD as PCP - General (Internal Medicine) [name], MD as Consulting Physician (General Surgery) Truitt Merle, MD as Consulting Physician (Hematology) Arloa Koh, MD as Consulting Physician (Radiation Oncology) Sylvan Cheese, NP as Nurse Practitioner (Hematology and Oncology)  SUMMARY OF ONCOLOGIC HISTORY: Oncology History    Breast cancer of upper-outer quadrant of left female breast   Staging form: Breast, AJCC 7th Edition     Clinical: Stage IIB (T3, N0, cM0) - Unsigned       Staging comments: Staged at breast conference 8.5.15      Pathologic stage from 05/08/2014: Stage IIA (yT1c(m), N1a, cM0) - Signed by Seward Grater, MD on 05/15/2014       Staging comments: Staged on final mastectomy specimen by Dr. Donato Heinz        Breast cancer of upper-outer quadrant of left female breast ([place])   10/17/2013 Mammogram    Left breast: persistent distortion in the UOQ, corresponding to the screening mammographic finding.       10/26/2013 Breast US    Left breast: hypoechoic irregular mass with surrounding distortion in the left breast 2 o'clock location 4 cm from the nipple measuring 1.1 x 0.9 x 0.7 cm. This corresponds to the mammographic finding. No left axillary LAD      10/30/2013 Initial Biopsy    Left breast core needle bx: Invasive ductal carcinoma, grade 2, ER+ (100%), PR+ (42%), HER2neu positive (ratio 2.8), Ki67 53%, DCIS with calcifications      11/05/2013 Breast MRI    Irregular enhancing mass measuring 5.1 cm in the upper and upper-outer quadrants of the left breast. The MR enhancing mass is much larger than the mass seen mammographically or sonographically.       11/05/2013 Breast MRI    Suspicious level 1 left axillary lymph node concerning for metastatic involving. But [place] negative, no node biopsy.       11/07/2013 Clinical Stage    Stage IIB: T2 N0 cM0      11/26/2013 - 11/26/2014  Neo-Adjuvant Chemotherapy    docetaxel, carboplatin, pertuzumab, and transtuzumab x 6 cycles (11/26/13-03/11/14) followed by mainteance trastuzumab (04/08/14-11/26/14)  to complete 1 year of therapy      05/08/2014 Surgery    Left mastectomy/SLNB Barry Dienes):  Multifocal invasive ductal carcinoma (0.5 cm, 1.4 cm), DCIS, 2/2 SLN were positive.      05/08/2014 Pathologic Stage    Stage IIA: ympT1c pN1a       07/10/2014 - 08/23/2014 Radiation Therapy    Adjuvant RT Valere Dross): Left chest wall/regional lymph nodes 50.4 Gy over 28 fractions; left chest wall boost 10 Gy over 5 fractions      08/22/2014 Procedure    port removal. Tip culture (+) Pseudomonas.       09/2014 -  Anti-estrogen oral therapy    Anastrozole 1 mg daily.  Planned duration of therapy 10 years Burr Medico).      01/16/2015 Survivorship    Survivorship visit completed and copy of care plan provided to patient.        CURRENT THERAPY: Anastrozole 1 mg once daily, started in June 2016.  INTERVAL HISTORY: Rachel [name] returns for follow up. She Is doing well overall.  She is very compliant with anastrozole, and tolerates. Well, no significant hot flashes or other noticeable side effects. She remains physically active, exercise 4-5 times a week, with treadmill, lifting etc. She also very careful about your diet, she was  really concerned about her cholesterol being slightly elevated to see, she eats vegetable, and does not eat red meat or seafood much. She has good appetite and any level, weight is stable. She reports moderate dysuria in the past 3-4 days, no fever, abdominal pain or back pain.  REVIEW OF SYSTEMS:   Constitutional: Denies fevers, chills.  Eyes: Denies blurriness of vision Ears, nose, mouth, throat, and face: Denies mucositis or sore throat Respiratory: Denies cough, dyspnea or wheezes Cardiovascular: Denies palpitation, chest discomfort or lower extremity swelling Gastrointestinal:  Denies nausea, heartburn or change in bowel  habits Skin: Denies abnormal skin rashes except a blister at the old port site Lymphatics: Denies new lymphadenopathy or easy bruising Neurological:Denies numbness, tingling or new weaknesses Behavioral/Psych: Mood is stable, no new changes  All other systems were reviewed with the patient and are negative except those in history   MEDICAL HISTORY:  Past Medical History:  Diagnosis Date  . Alopecia areata   . Anemia due to chemotherapy   . Breast cancer [facility]) oncologist-  dr Krista Blue Adreena Willits/  dx 07/ 2015--  DCIS left breast cT3, N0, Stage IIB  triple positive ,  ER+PR+HER2+/      s/p  chemotherapy 11-26-2013 to 03-11-2014 (after chemo, yT1cN1a)--  s/p  left mastectomy w/ SLN bx 05-08-2014/  radiation therapy currently started 07-10-2014  . History of endometriosis   . Hyperlipidemia   . Hypertension   . Infection due to port-a-cath    left chest area w/ cellulitis and skin ulcer   . 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 S/P radiation therapy 07/10/2014 through 08/23/2014    Left chest wall/regional lymph nodes 5040 cGy in 28 sessions, left chest wall boost 1000 cGy in 5 sessions   . Status post chemotherapy 11/26/2013 - 03/11/2014   6 cycles of neoadjuvant chemotherapy with TCH-P (Taxotere, Carbolatin, Herceptin and Perjeta followed by Neulasta  . Type 2 diabetes, diet controlled ([place])     SURGICAL HISTORY: Past Surgical History:  Procedure Laterality Date  . ABDOMINAL HYSTERECTOMY  1995   and 1996 Laparosopic Bilateral Salpingoophorectomy  . BREAST SURGERY Left 7/15   bx  . COLONOSCOPY    . MASTECTOMY W/ SENTINEL NODE BIOPSY Left 05/08/2014   Procedure: LEFT MASTECTOMY WITH SENTINEL LYMPH NODE BIOPSY;  Surgeon: [name], MD;  Location: [place];  Service: General;  Laterality: Left;  . PORT-A-CATH REMOVAL Left 08/22/2014   Procedure: REMOVAL PORT-A-CATH;  Surgeon: [name], MD;  Location:  [place];  Service: General;  Laterality: Left;  . PORTACATH PLACEMENT N/A 11/20/2013   Procedure: INSERTION PORT-A-CATH;  Surgeon: [name], MD;  Location: [place];  Service: General;  Laterality: N/A;  . TRANSTHORACIC ECHOCARDIOGRAM  05-30-2014   grade I diastolic dysfunction/ ef 29-79%/  moderate diffuse calcification AV without stenosis/  trivial MR and TR    I have reviewed the social history and family history with the patient and they are unchanged from previous note.  ALLERGIES:  is allergic to eggs or egg-derived products; nickel; sulfa antibiotics; neosporin [neomycin-bacitracin zn-polymyx]; rosuvastatin; multihance [gadobenate]; penicillins; and tetanus toxoids.  MEDICATIONS:    Medication List       Accurate as of 01/28/16 11:38 AM. Always use your most recent med list.          acetaminophen 500 MG tablet Commonly known as:  TYLENOL Take 500 mg by mouth every 6 (six) hours as needed (pain).   anastrozole 1 MG tablet  Commonly known as:  ARIMIDEX Take 1 tablet (1 mg total) by mouth daily.   calcium citrate-vitamin D 315-200 MG-UNIT tablet Commonly known as:  CITRACAL+D Take 1 tablet by mouth every other day.   ciprofloxacin 500 MG tablet Commonly known as:  CIPRO Take 1 tablet (500 mg total) by mouth 2 (two) times daily.   clobetasol 0.05 % external solution Commonly known as:  TEMOVATE Apply 1 application topically as needed.   Fish Oil 1000 MG Caps Take 1 capsule by mouth daily.   Garlic 2706 MG Caps Take 1 capsule by mouth daily.   hydrochlorothiazide 25 MG tablet Commonly known as:  HYDRODIURIL Take 25 mg by mouth every morning.   multivitamin with minerals Tabs tablet Take 1 tablet by mouth daily.   REPLENS Gel Place 1 application vaginally. Uses every three to 5 days        PHYSICAL EXAMINATION: ECOG PERFORMANCE STATUS: 0 BP 130/66 (BP Location: Left Arm, Patient Position: Sitting)   Pulse 77   Temp 98.4 [name] (36.9 C)  (Oral)   Resp 18   Ht '5\' 5"'  (1.651 m)   Wt 166 lb 14.4 oz (75.7 kg)   SpO2 100%   BMI 27.77 kg/m  GENERAL:alert, no distress and comfortable SKIN: skin color, texture, turgor are normal, no rashes or significant lesions except (+) small confluent folliculitis/abscess at the previous port site on left upper chest. (+) skin pigmentation at the left mastectomy site secondary to radiation. EYES: normal, Conjunctiva are pink and non-injected, sclera clear OROPHARYNX:no exudate, no erythema and lips, buccal mucosa, and tongue normal  NECK: supple, thyroid normal size, non-tender, without nodularity LYMPH:  no palpable lymphadenopathy in the cervical, axillary or inguinal LUNGS: clear to auscultation and percussion with normal breathing effort HEART: regular rate & rhythm and no murmurs and no lower extremity edema ABDOMEN:abdomen soft, non-tender and normal bowel sounds Musculoskeletal:no cyanosis of digits and no clubbing  NEURO: alert & oriented x 3 with fluent speech, no focal motor/sensory deficits Breasts: Status post left mastectomy, surgical incision has well healed, small scar at the previous port site.  No palpable mass on the left chest wall or axilla. Palpation of the right breasts and axilla revealed no obvious mass that I could appreciate. EXTREMITIES: no edema   LABORATORY DATA:  I have reviewed the data as listed CBC Latest Ref Rng & Units 01/28/2016 09/09/2015 06/17/2015  WBC 3.9 - 10.3 10e3/uL 4.4 3.6(L) 3.3(L)  Hemoglobin 11.6 - 15.9 g/dL 12.6 12.6 12.5  Hematocrit 34.8 - 46.6 % 39.2 37.6 37.4  Platelets 145 - 400 10e3/uL 171 148 152     CMP Latest Ref Rng & Units 01/28/2016 09/09/2015 06/17/2015  Glucose 70 - 140 mg/dl 107 87 94  BUN 7.0 - 26.0 mg/dL 15.9 16.7 16.2  Creatinine 0.6 - 1.1 mg/dL 0.9 0.9 0.9  Sodium 136 - 145 mEq/L 144 141 143  Potassium 3.5 - 5.1 mEq/L 3.8 4.1 4.1  Chloride 101 - 111 mmol/L - - -  CO2 22 - 29 mEq/L 29 32(H) 32(H)  Calcium 8.4 - 10.4 mg/dL  9.6 9.6 9.5  Total Protein 6.4 - 8.3 g/dL 7.0 7.0 6.8  Total Bilirubin 0.20 - 1.20 mg/dL 0.75 0.67 0.50  Alkaline Phos 40 - 150 U/L 95 80 80  AST 5 - 34 U/L '17 18 18  ' ALT 0 - 55 U/L '14 15 14   ' PATHOLOGY REPORT: Diagnosis 05/08/2014 1. Breast, simple mastectomy, left  - PENDING STAINS. - MULTIFOCAL INVASIVE DUCTAL  CARCINOMA WITH NEOADJUVANT RELATED CHANGES, SEE COMMENT. - NEGATIVE FOR LYMPH VASCULAR INVASION. - INVASIVE TUMOR IS 4 MM FROM NEAREST MARGIN (ANTERIOR). - DUCTAL CARCINOMA IN SITU - PREVIOUS BIOPSY SITE. - BENIGN SKIN; NEGATIVE FOR TUMOR - SEE TUMOR SYNOPTIC TEMPLATE BELOW. 2. Lymph node, sentinel, biopsy, left axillary #1 - ONE LYMPH NODE, POSITIVE FOR METASTATIC MAMMARY CARCINOMA (1/1). - INTRANODAL TUMOR DEPOSIT IS 2 MM - NEGATIVE FOR EXTRACAPSULAR TUMOR EXTENSION. 3. Lymph node, sentinel, biopsy, left axillary #2 - ONE LYMPH NODE, POSITIVE FOR METASTATIC MAMMARY CARCINOMA (1/1). - INTRANODAL TUMOR DEPOSIT IS 4 MM - NEGATIVE FOR EXTRACAPSULAR TUMOR EXTENSION.  Microscopic Comment 1. BREAST, INVASIVE TUMOR, WITH LYMPH NODES PRESENT Specimen, including laterality and lymph node sampling (sentinel, non-sentinel): Left breast Procedure: Simple mastectomy Histologic type: Ductal (x3 tumors), see comment. Grade: II of III (Tumor #1 and 2), see comment Tubule formation: 2 Nuclear pleomorphism: 3 Mitotic: 2 Grade: I of III (Tumor #3), see comment Tubule formation: 1 Nuclear pleomorphism: 1 Mitotic: 1 1 of 4 FINAL for ADAMS-FOUST, Rachel E ([id number]) Microscopic Comment(continued) Tumor size (glass slide measurement): 0.5 cm and 1.4 cm; gross measurement 2.0 cm, see comment. Margins: Invasive, distance to closest margin: 4 mm In-situ, distance to closest margin: 4 mm (anterior) If margin positive, focally or broadly: N/A Lymphovascular invasion: Absent Ductal carcinoma in situ: Present Grade: III of III Extensive intraductal component: Absent Lobular  neoplasia: Pending stains Tumor focality: Multifocal Treatment effect: Present If present, treatment effect in breast tissue, lymph nodes or both: Tissue Extent of tumor: Skin: Negative Nipple: Negative Skeletal muscle: Negative Lymph nodes: Examined: 2 Sentinel 0 Non-sentinel 2 Total Lymph nodes with metastasis: 2 Isolated tumor cells (< 0.2 mm): 0 Micrometastasis: (> 0.2 mm and < 2.0 mm): 0 Macrometastasis: (> 2.0 mm): X 2 Extracapsular extension: Absent Breast prognostic profile: Estrogen receptor: Not repeated, previous study demonstrated 100% positivity and 100% positivity ([id number] and [id number]) Progesterone receptor: Not repeated, previous study demonstrated 67%% and 42% positivity ([id number] and [id number]) Her 2 neu: Not repeated, previous study demonstrated amplification ([id number] and [id number]) Ki-67: Not repeated, previous study demonstrated 12% and 53% proliferation rate ([id number] and [id number]) Non-neoplastic breast: Previous biopsy site; fibrocystic change, usual ductal hyperplasia, calcifications and neoadjuvant related tissue changes. TNM: ympT1c, pN1a, pMX Comments: There are three different mass areas identified. The first central mass associated with barbell shaped clip demonstrates both in situ and invasive ductal carcinoma. Within the focus, the invasive ductal carcinoma spans 5 mm. The second posterior medial mass demonstrates both in situ and invasive ductal carcinoma spanning the entire 2.0 cm grossly identified stellate area. The third and final lateral mass with associated heart shaped clip demonstrates both in situ and invasive ductal carcinoma. The invasive tumor spans 1.4cm and is involved by and away from previous biopsy site. The lack of the myoepithelial layer was confirmed with smooth muscle myosin heavy chain, Calponin, and p63 immunostains (slide 1K).. The presence of strong diffuse E-Cadherin expression and absence of  cytokeratin 5/6 expression supports a ductal phenotype to the in situ carcinoma (slide 1K). (CR:kh 05-09-14) [place] RUND DO Pathologist, Electronic Signature (Case signed 05/13/2014) Specimen Gross and Clinical Information  RADIOGRAPHIC STUDIES: I have personally reviewed the radiological images as listed and agreed with the findings in the report.  ECHO 11/12/2014  Study Conclusions  - Left ventricle: The cavity size was normal. Wall thickness was increased in a pattern of mild LVH. There was focal basal hypertrophy. Systolic function was normal. The estimated ejection fraction was in  the range of 60% to 65%. Wall motion was normal; there were no regional wall motion abnormalities. Doppler parameters are consistent with abnormal left ventricular relaxation (grade 1 diastolic dysfunction).   Mammogram 11/18/2015 IMPRESSION: No mammographic evidence of malignancy. A result letter of this screening mammogram will be mailed directly to the patient.  RECOMMENDATION: Screening mammogram in one year. (Code:SM-B-01Y)   ASSESSMENT & PLAN:  This is a very pleasant 60 years-old African [place] female from [facility] who presents with a left Invasive Ductal Carcinoma. This was picked on a mammogram and maximal diameter was 1.7 cm on Ultrasound however the MRI breast showed this mass to be large almost 5.1 cm in maximal dimension making it a clinical T3 tumor. Also it showed a suspicious looking solitary lymph node in axilla but [place] was negative, not biopsied.  Clinical T3N0M0 Stage IIB.  The tumor biology indicates a LUMINAL-B type which means ER/PR/HER2 + tumor. Tumor is strongly ER+100%, PR 42%,KI-67 Index 53% showing a brisk mitotic rate and grade is called grade 2 (but grading as per pathologist cannot be relied upon on initial biopsy).   1. 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  -I discussed her surgical pathology findings with her in details. Unfortunately she had metastasis to 2 Sentinel lymph nodes. Axillary lymph node dissection was discussed, Dr. Barry Dienes, Dr. Valere Dross and me and we recommend to proceed ALND, which is the current standard care. She however declined surgery due to the concern of lymphedema. She opted adjuvant radiation including the axillary area.  -She is now on adjuvant anastrozole, tolerating well, we'll continue. Recent clinical data has shown to extended aromatase inhibitor for 10 years has decreased cancer recurrence, given her advanced stage and relative young age, I recommend her to take it for total of 10 years. -It has been 2.5 years since her initial diagnosis. She is clinically doing very well, I reviewed her lab results today, her physical exam was unremarkable today. No evidence for recurrence so far.  -I'll see her every 4 months for the next year, then every 6 months afterwards. We'll continue breast cancer surveillance with annual mammogram, her last mammogram was normal in August 2017 -I encouraged her to continue healthy diet and exercise regularly. She is very compliant on that.   2. Bone health  - I encouraged her to continue taking calcium and vitamin D -We discussed the potential side effects of osteoporosis from anastrozole -Her last bone density scan was done on 11/19/2014, which showed normal bone density.    3. HTN, DM, hypercholesterolemia -Follow up with primary care physician -We discussed the diet and exercise for hypercholesterolemia. She cannot tolerate statin, I encouraged her to discuss with her primary care physician about other cholesterol medicine.  4. UTI -She has had UTI symptoms for the past 3-4 days, UA today showed increased WBC in the urine, urine culture was sent. -I called in Cipro 500 mg twice daily for 5 days for her -I'll  follow-up her urine culture  Plan -Continue anastrozole -Return to clinic in 4 months with lab -She'll start Cipro 500 mg twice daily for 5 days  All questions were answered. The patient knows to call the clinic with any problems, questions or concerns. No barriers to learning was detected.  I spent 20 minutes counseling the patient face to face. The total time spent  in the appointment was 25 minutes and more than 50% was on counseling and review of test results     Truitt Merle, MD 01/28/2016   11:38 AM

## 2016-01-28 NOTE — Telephone Encounter (Signed)
Gave patient avs report and appointment for February  °

## 2016-01-29 ENCOUNTER — Telehealth: Payer: Self-pay | Admitting: *Deleted

## 2016-01-29 LAB — URINE CULTURE

## 2016-01-29 NOTE — Telephone Encounter (Signed)
"  I chose not to pay co-pays and do not understand why I do not have a balance due.  Do you all give receipts or do patients have to wait for the receipt.?  Where is the co-pay amount applied?  I want to make sure I've paid the bill and have called in July and today for the 2017 year." Provided Billing number [phone].

## 2016-01-30 ENCOUNTER — Telehealth: Payer: Self-pay | Admitting: *Deleted

## 2016-01-30 NOTE — Telephone Encounter (Signed)
I don't think she needs to stay on probiotics, but OK to use if she feels it's beneficial to her bowel.   Truitt Merle MD

## 2016-01-30 NOTE — Telephone Encounter (Signed)
Patient called and left message that she is on antibiotics and that she is taking a probiotic 2hrs after the antibiotics.  She wants [place] to know that she will probably stay on the probiotic even after she finishes the course of antibiotics.  Should we need to contact her - phone numbers are [phone]   Or [phone].   Will let Dr. Burr Medico know

## 2016-02-05 ENCOUNTER — Encounter: Payer: Self-pay | Admitting: *Deleted

## 2016-02-06 IMAGING — CT CT ANGIO CHEST
2 of 9 series · 18 of 46 positions shown · IV contrast (Omni 300)
Comparison: Chest 06/01/2014

CLINICAL DATA: Postoperative fever.

EXAM:
CT ANGIOGRAPHY CHEST WITH CONTRAST
TECHNIQUE: Multidetector CT imaging of the chest was performed using the
standard protocol during bolus administration of intravenous
contrast. Multiplanar CT image reconstructions and MIPs were
obtained to evaluate the vascular anatomy.
CONTRAST:  80mL OMNIPAQUE IOHEXOL 350 MG/ML SOLN

[Series 5: thins · axial · 0.59mm/px · z∈[+855,+1083]mm · 15 of 260 slices shown]
[im 16/260  lung]
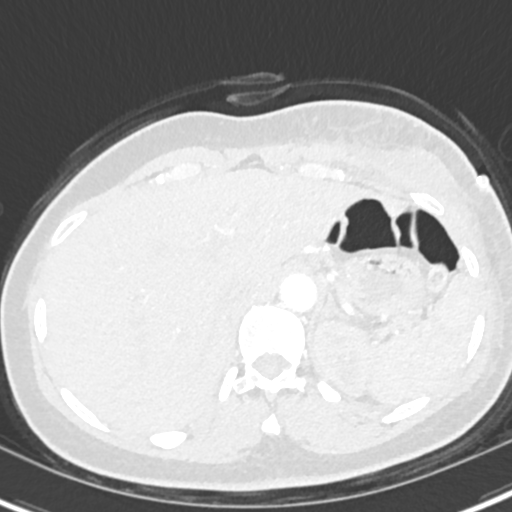
[im 31/260  soft-tissue]
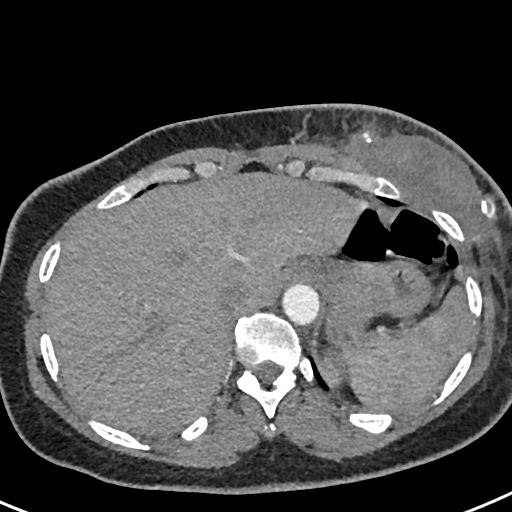
[im 46/260  lung]
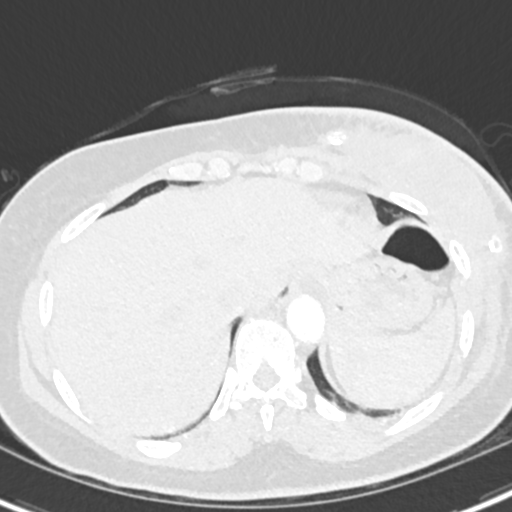
[im 61/260  soft-tissue]
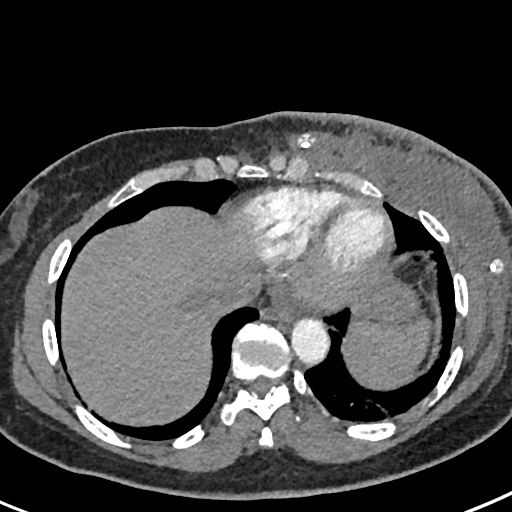
[im 77/260  lung]
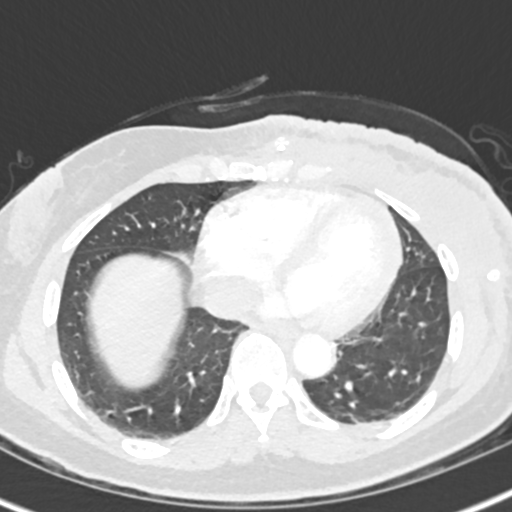
[im 92/260  soft-tissue]
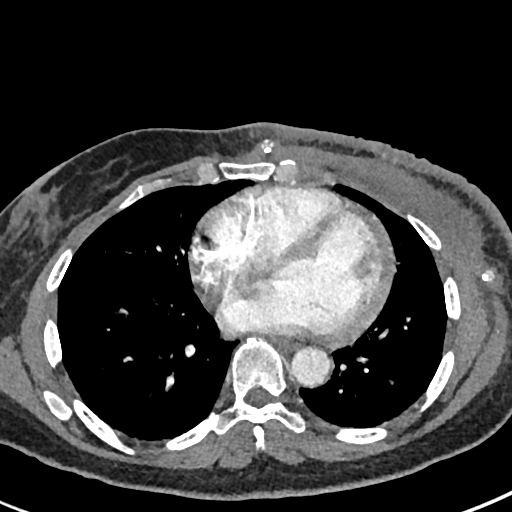
[im 107/260  lung]
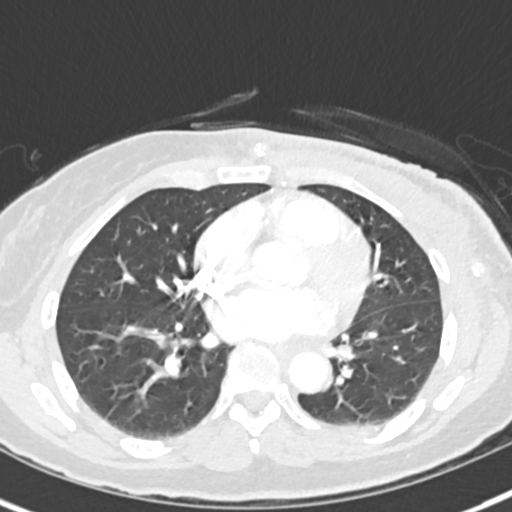
[im 138/260  soft-tissue]
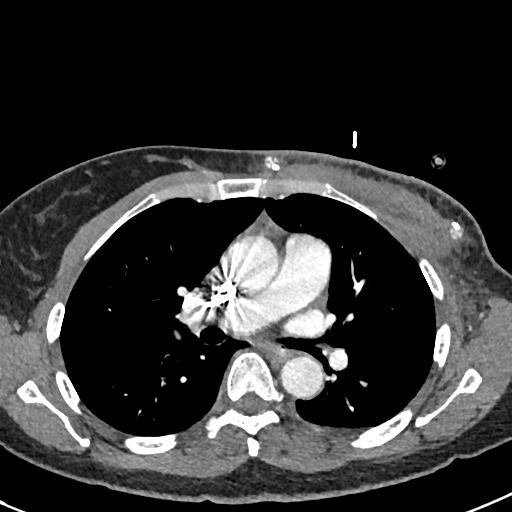
[im 153/260  lung]
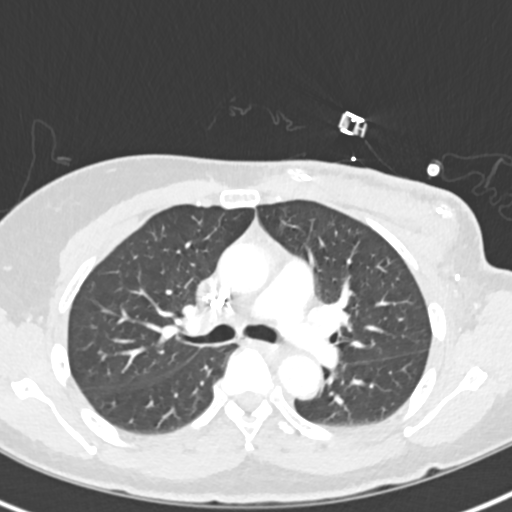
[im 168/260  soft-tissue]
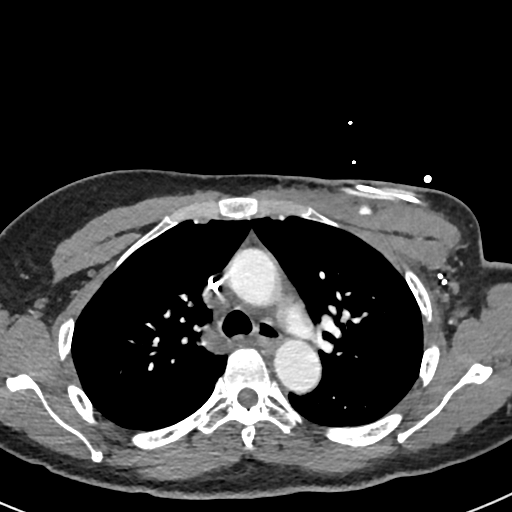
[im 183/260  lung]
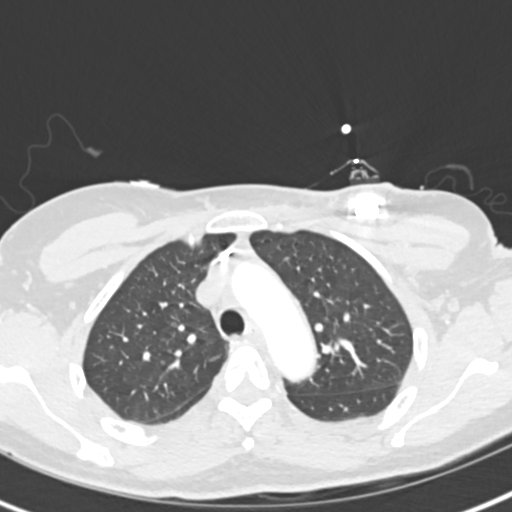
[im 199/260  soft-tissue]
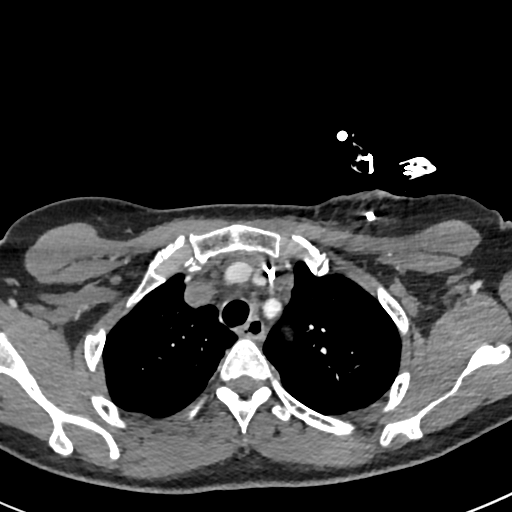
[im 214/260  lung]
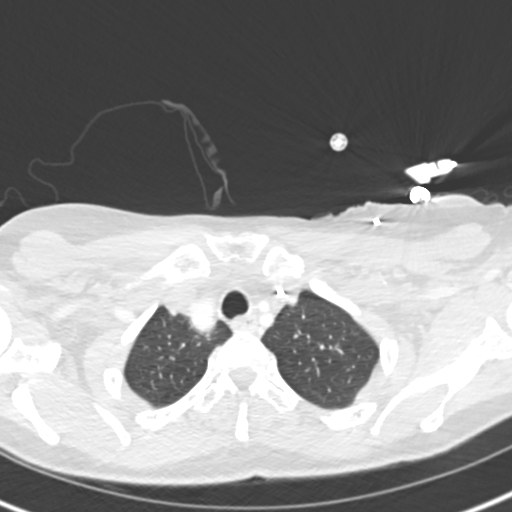
[im 229/260  soft-tissue]
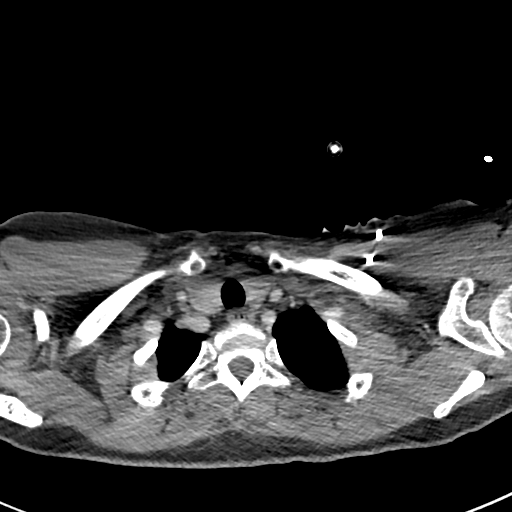
[im 244/260  lung]
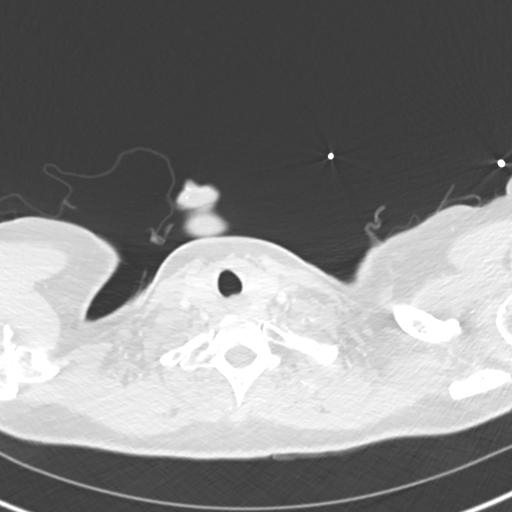

[Series 7: coronal mpr · coronal · 0.52mm/px · 3 of 112 slices shown]
[im 28/112  soft-tissue]
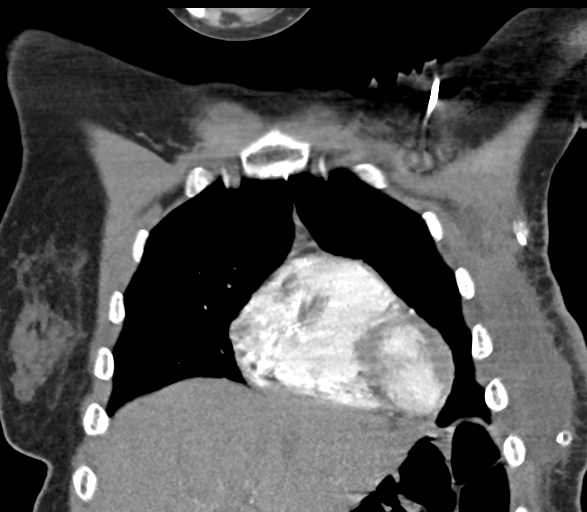
[im 56/112  soft-tissue]
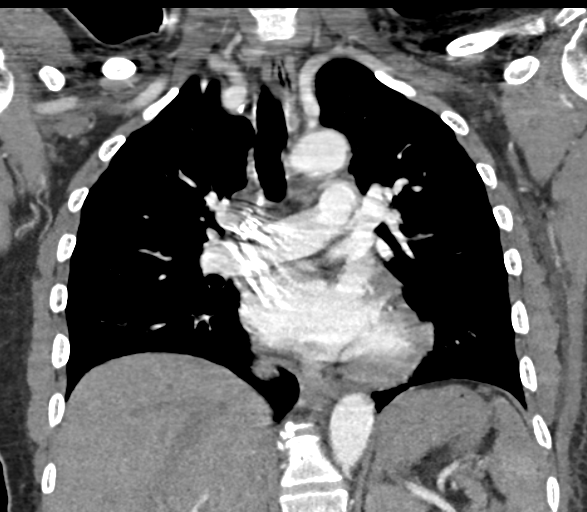
[im 84/112  soft-tissue]
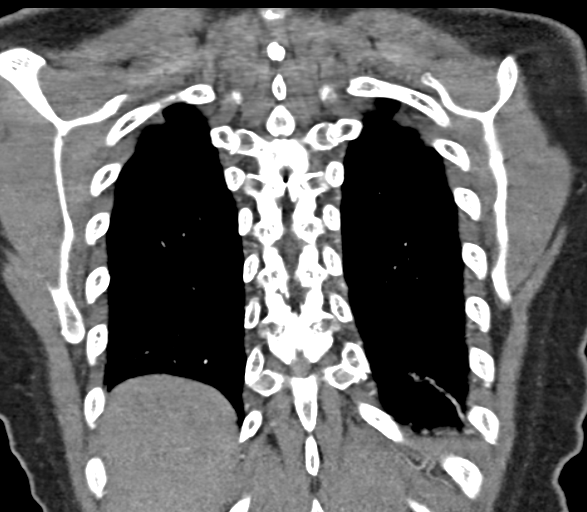

[18 of 46 positions shown; findings below may reference images not displayed]

FINDINGS: Technically adequate study with good opacification of the central
and segmental pulmonary arteries. No focal filling defects
demonstrated. No evidence of significant pulmonary embolus.

Normal heart size. Normal caliber thoracic aorta. No evidence of
aortic dissection. Great vessel origins appear patent. Central
venous catheter with tip in the superior vena cava. Esophagus is
decompressed. No significant lymphadenopathy in the chest.

Mild dependent atelectasis in the lung bases. Lungs are otherwise
clear and expanded. No focal airspace disease or consolidation. No
pleural effusion. No pneumothorax.

Included portions of the upper abdominal organs are grossly
unremarkable. Mild degenerative changes in the spine. No destructive
bone lesions. Postoperative changes in the left breast with surgical
clips in the left axilla and surgical drain in place. There is a
heterogeneous fluid collection in the left breast measuring 3.5 x
12.9 cm. This may represent postoperative fluid collection although
abscess is not excluded. There is infiltration and thickening of the
skin and subcutaneous fat over the left breast. This could represent
cellulitis or postoperative change. If the patient has had radiation
to this area, radiation changes could also have this appearance.

Review of the MIP images confirms the above findings.
IMPRESSION: No evidence of significant pulmonary embolus. No evidence of active
pulmonary disease. Postoperative changes in the left breast.
Heterogeneous fluid collection in the left breast could represent
postoperative fluid collection versus abscess. Infiltration in the
subcutaneous fat and skin over the left breast could represent
cellulitis, radiation change, or postoperative change. Correlation
with physical examination is recommended.

## 2016-02-06 IMAGING — CR DG CHEST 2V
2 series · 2 of 2 positions shown · non-contrast
Comparison: [DATE]

CLINICAL DATA: Syncope, breast cancer status post left mastectomy
on [REDACTED], left chest JP drain

EXAM:
CHEST  2 VIEW

[w chest pa]
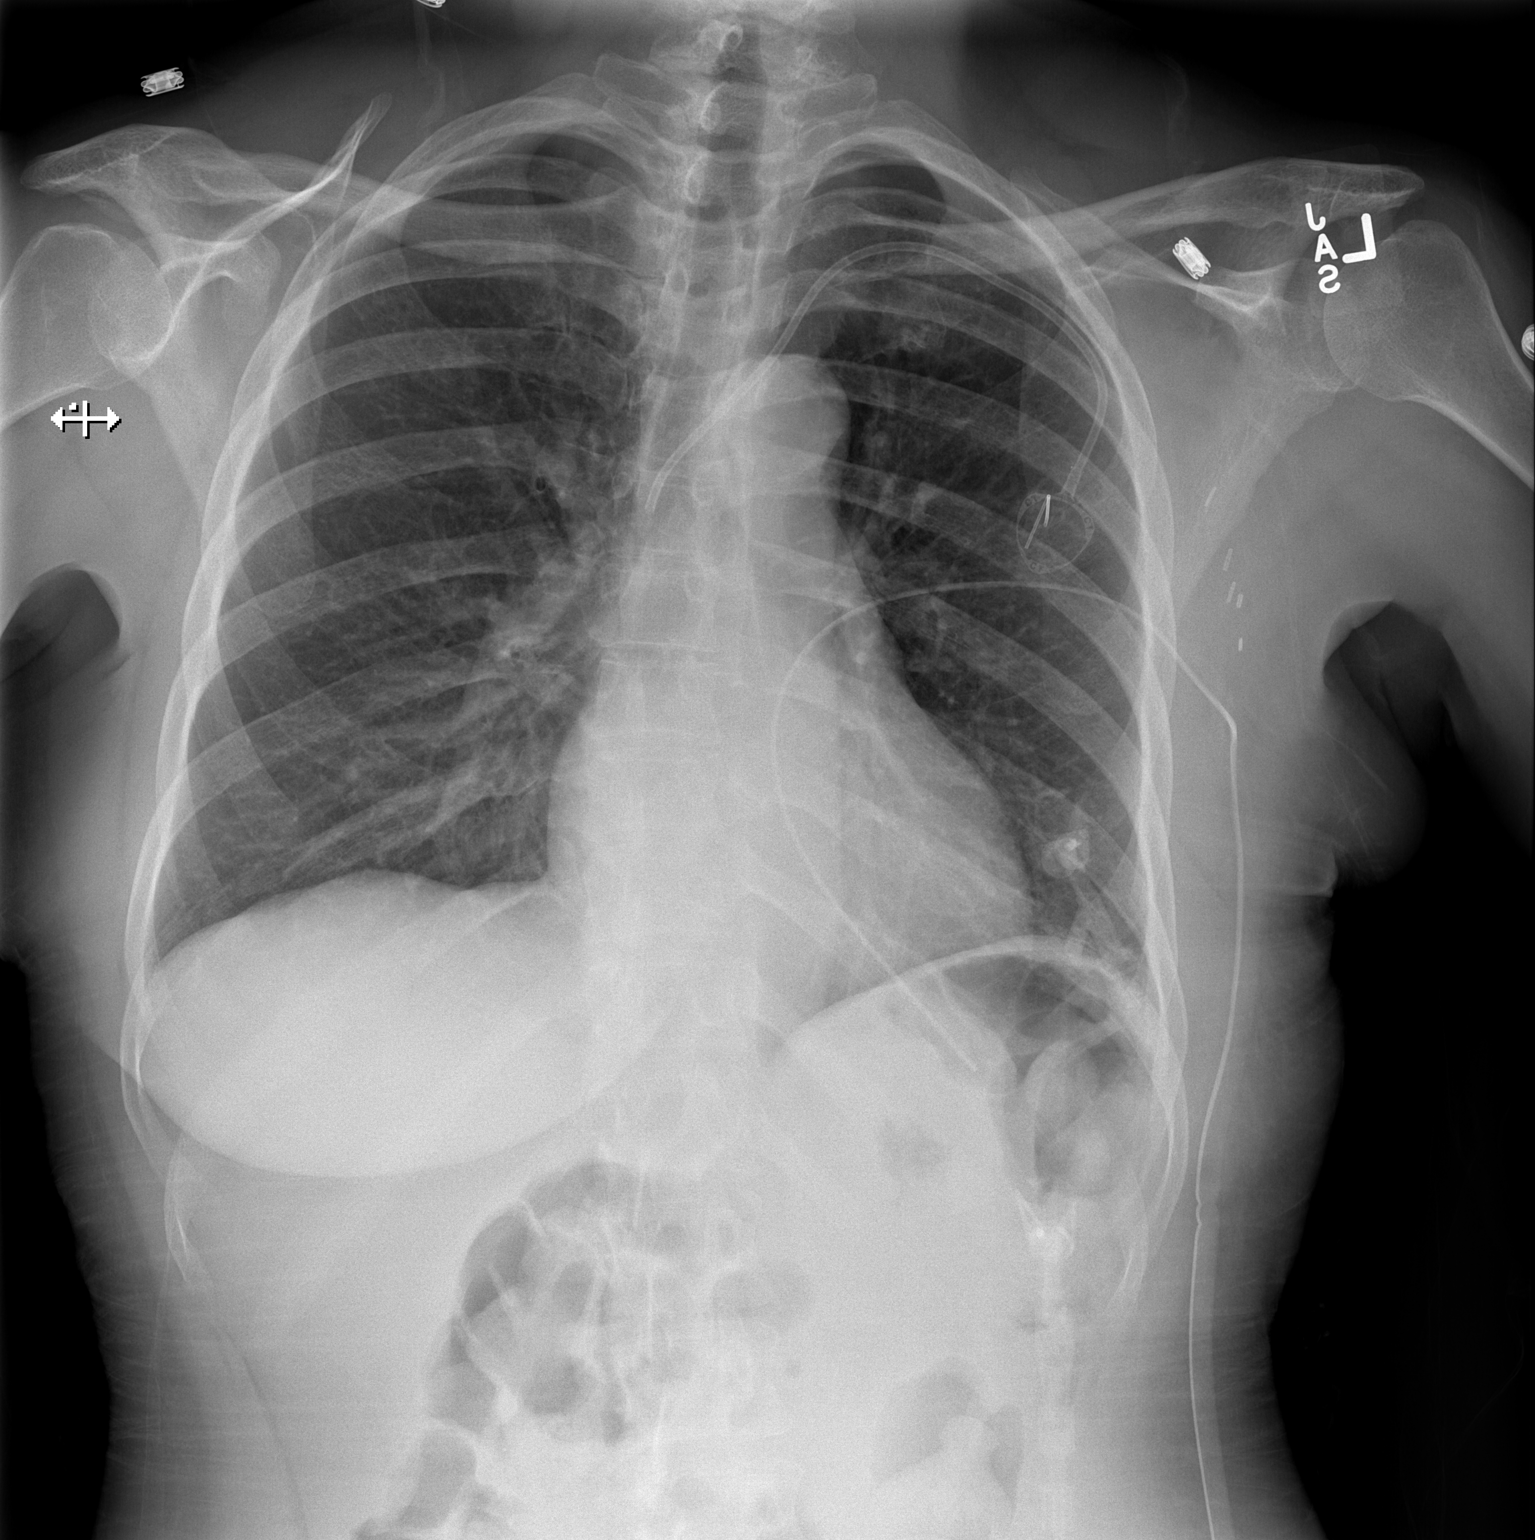

[w chest lat]
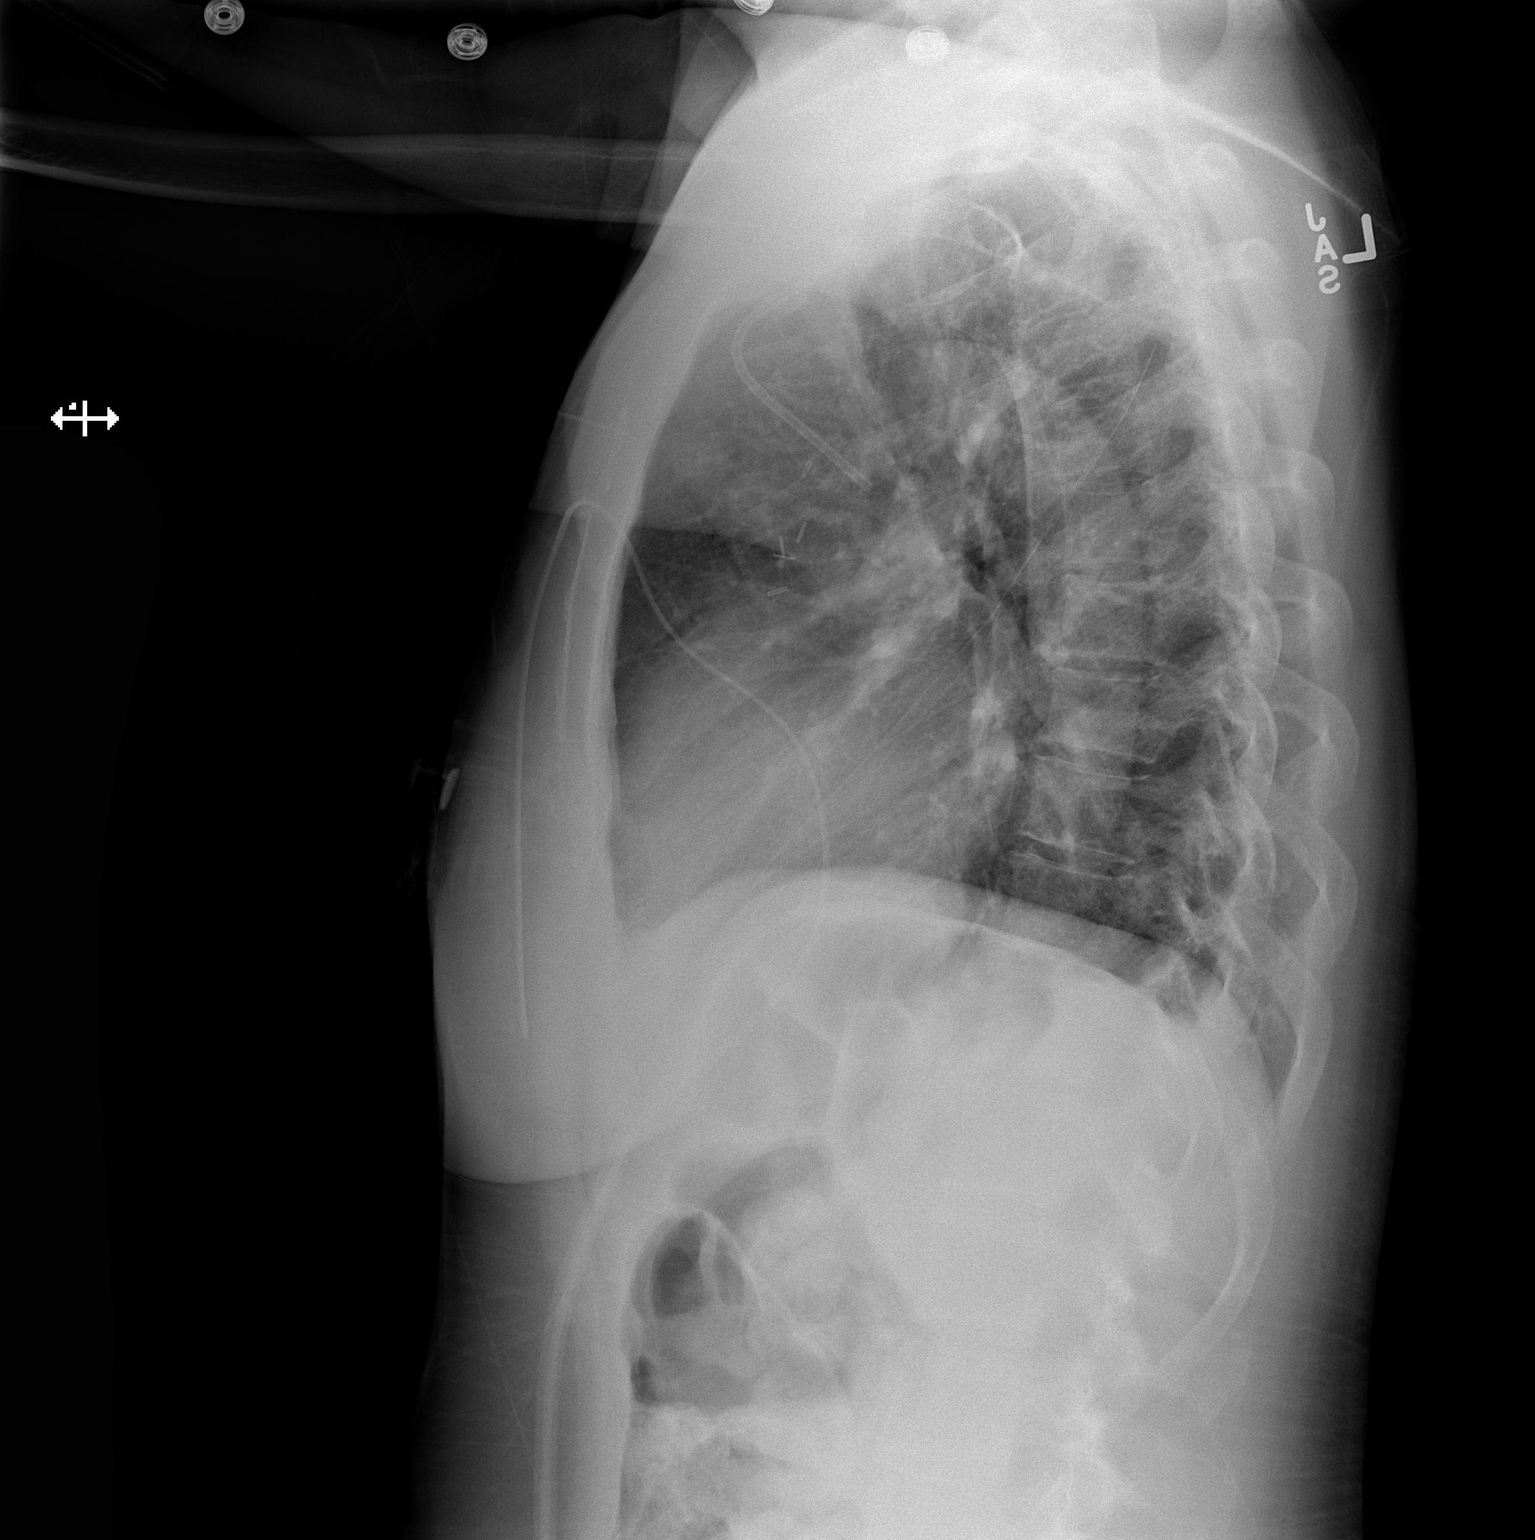

[2 of 2 positions shown; findings below may reference images not displayed]

FINDINGS: Lungs are clear.  No pleural effusion or pneumothorax.

The heart is normal in size.

Left chest power port terminating in the mid SVC.

Status post left mastectomy with left axillary lymph node
dissection.

Associated left chest wall drain.
IMPRESSION: No evidence of acute cardiopulmonary disease.

Status post left mastectomy with associated left chest wall drain.

## 2016-02-19 NOTE — Telephone Encounter (Signed)
Called patient and let her know that Dr. Burr Medico did not feel like she needed to stay on probiotics, but it was OK to use if she felt it was beneficial for her bowels.  Patient appreciated the call back.

## 2016-03-20 ENCOUNTER — Other Ambulatory Visit: Payer: Self-pay | Admitting: Nurse Practitioner

## 2016-04-15 ENCOUNTER — Other Ambulatory Visit: Payer: Self-pay | Admitting: Gastroenterology

## 2016-05-26 ENCOUNTER — Ambulatory Visit: Payer: Managed Care, Other (non HMO) | Admitting: Hematology

## 2016-05-26 ENCOUNTER — Other Ambulatory Visit: Payer: Managed Care, Other (non HMO)

## 2016-06-01 NOTE — Progress Notes (Signed)
[place] OFFICE PROGRESS NOTE  Patient Care Team: Leeroy Cha, MD as PCP - General (Internal Medicine) [name], MD as Consulting Physician (General Surgery) Truitt Merle, MD as Consulting Physician (Hematology) Arloa Koh, MD as Consulting Physician (Radiation Oncology)  SUMMARY OF ONCOLOGIC HISTORY: Oncology History    Breast cancer of upper-outer quadrant of left female breast   Staging form: Breast, AJCC 7th Edition     Clinical: Stage IIB (T3, N0, cM0) - Unsigned       Staging comments: Staged at breast conference 8.5.15      Pathologic stage from 05/08/2014: Stage IIA (yT1c(m), N1a, cM0) - Signed by Seward Grater, MD on 05/15/2014       Staging comments: Staged on final mastectomy specimen by Dr. Donato Heinz        Breast cancer of upper-outer quadrant of left female breast ([place])   10/17/2013 Mammogram    Left breast: persistent distortion in the UOQ, corresponding to the screening mammographic finding.       10/26/2013 Breast US    Left breast: hypoechoic irregular mass with surrounding distortion in the left breast 2 o'clock location 4 cm from the nipple measuring 1.1 x 0.9 x 0.7 cm. This corresponds to the mammographic finding. No left axillary LAD      10/30/2013 Initial Biopsy    Left breast core needle bx: Invasive ductal carcinoma, grade 2, ER+ (100%), PR+ (42%), HER2neu positive (ratio 2.8), Ki67 53%, DCIS with calcifications      11/05/2013 Breast MRI    Irregular enhancing mass measuring 5.1 cm in the upper and upper-outer quadrants of the left breast. The MR enhancing mass is much larger than the mass seen mammographically or sonographically.       11/05/2013 Breast MRI    Suspicious level 1 left axillary lymph node concerning for metastatic involving. But [place] negative, no node biopsy.       11/07/2013 Clinical Stage    Stage IIB: T2 N0 cM0      11/26/2013 - 11/26/2014 Neo-Adjuvant Chemotherapy    docetaxel, carboplatin, pertuzumab, and  transtuzumab x 6 cycles (11/26/13-03/11/14) followed by mainteance trastuzumab (04/08/14-11/26/14)  to complete 1 year of therapy      05/08/2014 Surgery    Left mastectomy/SLNB Barry Dienes):  Multifocal invasive ductal carcinoma (0.5 cm, 1.4 cm), DCIS, 2/2 SLN were positive.      05/08/2014 Pathologic Stage    Stage IIA: ympT1c pN1a       07/10/2014 - 08/23/2014 Radiation Therapy    Adjuvant RT Valere Dross): Left chest wall/regional lymph nodes 50.4 Gy over 28 fractions; left chest wall boost 10 Gy over 5 fractions      08/22/2014 Procedure    port removal. Tip culture (+) Pseudomonas.       09/2014 -  Anti-estrogen oral therapy    Anastrozole 1 mg daily.  Planned duration of therapy 10 years Burr Medico).      01/16/2015 Survivorship    Survivorship visit completed and copy of care plan provided to patient.        CURRENT THERAPY: Anastrozole 1 mg once daily, started in June 2016.  INTERVAL HISTORY: Ginny returns for follow up. The patient is currently on Anastrozole. She reports she is "feeling great." She is not having any problems with Anastrozole. She reports increased appetite, but no weight gain. She has stopped taking red yeast rice because of constipation; this has since resolved. The patient reports she is seeing Dr. Fara Olden to follow with her cholesterol. She  is no longer seeing Dr. Delfina Redwood. The patient reports she continues to exercise and stretch regularly. She is starting the [facility] in March. She performs routine self breast exams.  REVIEW OF SYSTEMS:   Constitutional: Denies fevers, chills, (+) increased appetite Eyes: Denies blurriness of vision Ears, nose, mouth, throat, and face: Denies mucositis or sore throat Respiratory: Denies cough, dyspnea or wheezes Cardiovascular: Denies palpitation, chest discomfort or lower extremity swelling Gastrointestinal:  Denies nausea, heartburn or change in bowel habits Skin: Denies abnormal skin rashes except a blister at the  old port site Lymphatics: Denies new lymphadenopathy or easy bruising Neurological:Denies numbness, tingling or new weaknesses Behavioral/Psych: Mood is stable, no new changes  All other systems were reviewed with the patient and are negative except those in history.  MEDICAL HISTORY:  Past Medical History:  Diagnosis Date  . Alopecia areata   . Anemia due to chemotherapy   . Breast cancer [facility]) oncologist-  dr Krista Blue Plumer [place]elstaedt/  dx 07/ 2015--  DCIS left breast cT3, N0, Stage IIB  triple positive ,  ER+PR+HER2+/      s/p  chemotherapy 11-26-2013 to 03-11-2014 (after chemo, yT1cN1a)--  s/p  left mastectomy w/ SLN bx 05-08-2014/  radiation therapy currently started 07-10-2014  . History of endometriosis   . Hyperlipidemia   . Hypertension   . Infection due to port-a-cath    left chest area w/ cellulitis and skin ulcer   . 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 S/P radiation therapy 07/10/2014 through 08/23/2014    Left chest wall/regional lymph nodes 5040 cGy in 28 sessions, left chest wall boost 1000 cGy in 5 sessions   . Status post chemotherapy 11/26/2013 - 03/11/2014   6 cycles of neoadjuvant chemotherapy with TCH-P (Taxotere, Carbolatin, Herceptin and Perjeta followed by Neulasta  . Type 2 diabetes, diet controlled ([place])     SURGICAL HISTORY: Past Surgical History:  Procedure Laterality Date  . ABDOMINAL HYSTERECTOMY  1995   and 1996 Laparosopic Bilateral Salpingoophorectomy  . BREAST SURGERY Left 7/15   bx  . COLONOSCOPY    . MASTECTOMY W/ SENTINEL NODE BIOPSY Left 05/08/2014   Procedure: LEFT MASTECTOMY WITH SENTINEL LYMPH NODE BIOPSY;  Surgeon: [name], MD;  Location: [place];  Service: General;  Laterality: Left;  . PORT-A-CATH REMOVAL Left 08/22/2014   Procedure: REMOVAL PORT-A-CATH;  Surgeon: [name], MD;  Location: [place];  Service: General;  Laterality: Left;   . PORTACATH PLACEMENT N/A 11/20/2013   Procedure: INSERTION PORT-A-CATH;  Surgeon: [name], MD;  Location: [place];  Service: General;  Laterality: N/A;  . TRANSTHORACIC ECHOCARDIOGRAM  05-30-2014   grade I diastolic dysfunction/ ef 96-75%/  moderate diffuse calcification AV without stenosis/  trivial MR and TR    I have reviewed the social history and family history with the patient and they are unchanged from previous note.  ALLERGIES:  is allergic to eggs or egg-derived products; nickel; sulfa antibiotics; neosporin [neomycin-bacitracin zn-polymyx]; rosuvastatin; multihance [gadobenate]; penicillins; and tetanus toxoids.  MEDICATIONS:  Allergies as of 06/02/2016      Reactions   Eggs Or Egg-derived Products Nausea Only   "has never had a flu shot"   Nickel Hives   Sulfa Antibiotics Swelling   Neosporin [neomycin-bacitracin Zn-polymyx] Swelling   Swelling of area, redness   Contains sulfur   Rosuvastatin    Other reaction(s): myalgia   Multihance [gadobenate] Nausea And Vomiting   Contrast dye   Penicillins Rash  Tetanus Toxoids Swelling, Rash      Medication List       Accurate as of 06/02/16  9:22 PM. Always use your most recent med list.          acetaminophen 500 MG tablet Commonly known as:  TYLENOL Take 500 mg by mouth every 6 (six) hours as needed (pain).   anastrozole 1 MG tablet Commonly known as:  ARIMIDEX Take 1 tablet (1 mg total) by mouth daily.   calcium citrate-vitamin D 315-200 MG-UNIT tablet Commonly known as:  CITRACAL+D Take 1 tablet by mouth every other day.   clobetasol 0.05 % external solution Commonly known as:  TEMOVATE Apply 1 application topically as needed.   Fish Oil 1000 MG Caps Take 1 capsule by mouth daily.   hydrochlorothiazide 25 MG tablet Commonly known as:  HYDRODIURIL Take 25 mg by mouth every morning.   multivitamin with minerals Tabs tablet Take 1 tablet by mouth daily.   REPLENS Gel Place 1 application  vaginally. Uses every three to 5 days   SOLUBLE FIBER/PROBIOTICS PO Take 1 tablet by mouth daily. 1 billion active culture tablet  -  Per pt.   vitamin E 1000 UNIT capsule Generic drug:  vitamin E Take 1,000 Units by mouth daily.        PHYSICAL EXAMINATION: ECOG PERFORMANCE STATUS: 0 BP 133/81 (BP Location: Left Arm, Patient Position: Sitting)   Pulse 67   Temp 98 F (36.7 C) (Oral)   Resp 18   Ht '5\' 5"'  (1.651 m)   Wt 164 lb 8 oz (74.6 kg)   SpO2 100%   BMI 27.37 kg/m  GENERAL:alert, no distress and comfortable. SKIN: skin color, texture, turgor are normal, no rashes or significant lesions except (+) small confluent folliculitis/abscess at the previous port site on left upper chest. (+) skin pigmentation at the left mastectomy site secondary to radiation. EYES: normal, Conjunctiva are pink and non-injected, sclera clear OROPHARYNX:no exudate, no erythema and lips, buccal mucosa, and tongue normal  NECK: supple, thyroid normal size, non-tender, without nodularity LYMPH:  no palpable lymphadenopathy in the cervical, axillary or inguinal LUNGS: clear to auscultation and percussion with normal breathing effort HEART: regular rate & rhythm and no murmurs and no lower extremity edema ABDOMEN:abdomen soft, non-tender and normal bowel sounds Musculoskeletal:no cyanosis of digits and no clubbing  NEURO: alert & oriented x 3 with fluent speech, no focal motor/sensory deficits BREASTS: Status post left mastectomy, surgical incision has well healed, small scar at the previous port site.  No palpable mass on the left chest wall or axilla. Palpation of the right breasts and axilla revealed no obvious mass that I could appreciate. EXTREMITIES: no edema  LABORATORY DATA:  I have reviewed the data as listed. CBC Latest Ref Rng & Units 06/02/2016 01/28/2016 09/09/2015  WBC 3.9 - 10.3 10e3/uL 3.8(L) 4.4 3.6(L)  Hemoglobin 11.6 - 15.9 g/dL 12.5 12.6 12.6  Hematocrit 34.8 - 46.6 % 37.6 39.2 37.6   Platelets 145 - 400 10e3/uL 168 171 148     CMP Latest Ref Rng & Units 06/02/2016 01/28/2016 09/09/2015  Glucose 70 - 140 mg/dl 92 107 87  BUN 7.0 - 26.0 mg/dL 15.6 15.9 16.7  Creatinine 0.6 - 1.1 mg/dL 0.9 0.9 0.9  Sodium 136 - 145 mEq/L 143 144 141  Potassium 3.5 - 5.1 mEq/L 4.1 3.8 4.1  Chloride 101 - 111 mmol/L - - -  CO2 22 - 29 mEq/L 32(H) 29 32(H)  Calcium 8.4 - 10.4 mg/dL  10.0 9.6 9.6  Total Protein 6.4 - 8.3 g/dL 7.0 7.0 7.0  Total Bilirubin 0.20 - 1.20 mg/dL 0.84 0.75 0.67  Alkaline Phos 40 - 150 U/L 88 95 80  AST 5 - 34 U/L '22 17 18  ' ALT 0 - 55 U/L '20 14 15   ' PATHOLOGY REPORT: Diagnosis 05/08/2014 1. Breast, simple mastectomy, left  - PENDING STAINS. - MULTIFOCAL INVASIVE DUCTAL CARCINOMA WITH NEOADJUVANT RELATED CHANGES, SEE COMMENT. - NEGATIVE FOR LYMPH VASCULAR INVASION. - INVASIVE TUMOR IS 4 MM FROM NEAREST MARGIN (ANTERIOR). - DUCTAL CARCINOMA IN SITU - PREVIOUS BIOPSY SITE. - BENIGN SKIN; NEGATIVE FOR TUMOR - SEE TUMOR SYNOPTIC TEMPLATE BELOW. 2. Lymph node, sentinel, biopsy, left axillary #1 - ONE LYMPH NODE, POSITIVE FOR METASTATIC MAMMARY CARCINOMA (1/1). - INTRANODAL TUMOR DEPOSIT IS 2 MM - NEGATIVE FOR EXTRACAPSULAR TUMOR EXTENSION. 3. Lymph node, sentinel, biopsy, left axillary #2 - ONE LYMPH NODE, POSITIVE FOR METASTATIC MAMMARY CARCINOMA (1/1). - INTRANODAL TUMOR DEPOSIT IS 4 MM - NEGATIVE FOR EXTRACAPSULAR TUMOR EXTENSION.   RADIOGRAPHIC STUDIES: I have personally reviewed the radiological images as listed and agreed with the findings in the report.  ECHO 11/12/2014 Study Conclusions - Left ventricle: The cavity size was normal. Wall thickness was increased in a pattern of mild LVH. There was focal basal hypertrophy. Systolic function was normal. The estimated ejection fraction was in the range of 60% to 65%. Wall motion was normal; there were no regional wall motion abnormalities. Doppler parameters are consistent with abnormal left  ventricular relaxation (grade 1 diastolic dysfunction).   Mammogram 11/18/2015 IMPRESSION: No mammographic evidence of malignancy. A result letter of this screening mammogram will be mailed directly to the patient.   ASSESSMENT & PLAN:  This is a very pleasant 61 years-old African [place] female from [facility] who presents with a left Invasive Ductal Carcinoma. This was picked on a mammogram and maximal diameter was 1.7 cm on Ultrasound however the MRI breast showed this mass to be large almost 5.1 cm in maximal dimension making it a clinical T3 tumor. Also it showed a suspicious looking solitary lymph node in axilla but [place] was negative, not biopsied.  Clinical T3N0M0 Stage IIB.  The tumor biology indicates a LUMINAL-B type which means ER/PR/HER2 + tumor. Tumor is strongly ER+100%, PR 42%,KI-67 Index 53% showing a brisk mitotic rate and grade is called grade 2 (but grading as per pathologist cannot be relied upon on initial biopsy).   1. 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  -I previously discussed her surgical pathology findings with her in details. Unfortunately she had metastasis to 2 Sentinel lymph nodes. Axillary lymph node dissection was discussed, Dr. Barry Dienes, Dr. Valere Dross and we recommend to proceed ALND, which is the current standard care. She however declined surgery due to the concern of lymphedema. She opted adjuvant radiation including the axillary area.  -She is now on adjuvant anastrozole, tolerating well, we'll continue. Recent clinical data has shown no significant difference on extended aromatase inhibitor for 7 vs 10 years, given her advanced stage and relative young age, I recommend her to take it for total of 7 years. -It has been nearly 3 years since her initial diagnosis. She is clinically doing very well, I  reviewed her lab results today, her physical exam was unremarkable today. No evidence for recurrence so far. -I'll see her every 6  months. We'll continue breast cancer surveillance with annual mammogram, her last mammogram was normal in August 2017. -She is due for mammogram and bone scan in August. -I again encouraged her to continue healthy diet and exercise regularly. She is very compliant on that.   2. Bone health  - I again encouraged her to continue taking calcium and vitamin D. -We previously discussed the potential side effects of osteoporosis from anastrozole -Her last bone density scan was done on 11/19/2014, which showed normal bone density.    3. HTN, DM, hypercholesterolemia -Follow up with primary care physician -We previously discussed the diet and exercise for hypercholesterolemia. She cannot tolerate statin, I encouraged her to discuss with her primary care physician about other cholesterol medicine. -I encouraged the patient to continue to stay active.   Plan -Continue anastrozole -Continue multivitamin and calcium supplement -The patient is due for mammogram and bone density scan in August, I ordered for her today  -Return to clinic in 6 months with lab. -No refills needed today. -I will copy my note to her new PCP  All questions were answered. The patient knows to call the clinic with any problems, questions or concerns. No barriers to learning was detected.  I spent 20 minutes counseling the patient face to face. The total time spent in the appointment was 25 minutes and more than 50% was on counseling and review of test results  This document serves as a record of services personally performed by Truitt Merle, MD. It was created on her behalf by Maryla Morrow, a trained medical scribe. The creation of this record is based on the scribe's personal observations and the provider's statements to them. This document has been checked and approved by the attending  provider.    Truitt Merle, MD 06/02/2016

## 2016-06-02 ENCOUNTER — Telehealth: Payer: Self-pay | Admitting: *Deleted

## 2016-06-02 ENCOUNTER — Ambulatory Visit (HOSPITAL_BASED_OUTPATIENT_CLINIC_OR_DEPARTMENT_OTHER): Payer: Managed Care, Other (non HMO) | Admitting: Hematology

## 2016-06-02 ENCOUNTER — Other Ambulatory Visit (HOSPITAL_BASED_OUTPATIENT_CLINIC_OR_DEPARTMENT_OTHER): Payer: Managed Care, Other (non HMO)

## 2016-06-02 ENCOUNTER — Encounter: Payer: Self-pay | Admitting: Hematology

## 2016-06-02 VITALS — BP 133/81 | HR 67 | Temp 98.0°F | Resp 18 | Ht 65.0 in | Wt 164.5 lb

## 2016-06-02 DIAGNOSIS — I10 Essential (primary) hypertension: Secondary | ICD-10-CM

## 2016-06-02 DIAGNOSIS — C50412 Malignant neoplasm of upper-outer quadrant of left female breast: Secondary | ICD-10-CM

## 2016-06-02 DIAGNOSIS — E119 Type 2 diabetes mellitus without complications: Secondary | ICD-10-CM | POA: Diagnosis not present

## 2016-06-02 DIAGNOSIS — Z170 Estrogen receptor positive status [ER+]: Secondary | ICD-10-CM | POA: Diagnosis not present

## 2016-06-02 DIAGNOSIS — Z79811 Long term (current) use of aromatase inhibitors: Secondary | ICD-10-CM | POA: Diagnosis not present

## 2016-06-02 DIAGNOSIS — Z780 Asymptomatic menopausal state: Secondary | ICD-10-CM

## 2016-06-02 LAB — COMPREHENSIVE METABOLIC PANEL
ALT: 20 U/L (ref 0–55)
ANION GAP: 8 meq/L (ref 3–11)
AST: 22 U/L (ref 5–34)
Albumin: 3.7 g/dL (ref 3.5–5.0)
Alkaline Phosphatase: 88 U/L (ref 40–150)
BUN: 15.6 mg/dL (ref 7.0–26.0)
CHLORIDE: 104 meq/L (ref 98–109)
CO2: 32 meq/L — AB (ref 22–29)
CREATININE: 0.9 mg/dL (ref 0.6–1.1)
Calcium: 10 mg/dL (ref 8.4–10.4)
EGFR: 77 mL/min/{1.73_m2} — ABNORMAL LOW (ref >90)
Glucose: 92 mg/dl (ref 70–140)
Potassium: 4.1 mEq/L (ref 3.5–5.1)
SODIUM: 143 meq/L (ref 136–145)
Total Bilirubin: 0.84 mg/dL (ref 0.20–1.20)
Total Protein: 7 g/dL (ref 6.4–8.3)

## 2016-06-02 LAB — CBC WITH DIFFERENTIAL/PLATELET
BASO%: 0.8 % (ref 0.0–2.0)
Basophils Absolute: 0 10*3/uL (ref 0.0–0.1)
EOS%: 3.4 % (ref 0.0–7.0)
Eosinophils Absolute: 0.1 10*3/uL (ref 0.0–0.5)
HCT: 37.6 % (ref 34.8–46.6)
HGB: 12.5 g/dL (ref 11.6–15.9)
LYMPH%: 33.8 % (ref 14.0–49.7)
MCH: 29.3 pg (ref 25.1–34.0)
MCHC: 33.2 g/dL (ref 31.5–36.0)
MCV: 88.1 fL (ref 79.5–101.0)
MONO#: 0.3 10*3/uL (ref 0.1–0.9)
MONO%: 8.2 % (ref 0.0–14.0)
NEUT#: 2 10*3/uL (ref 1.5–6.5)
NEUT%: 53.8 % (ref 38.4–76.8)
PLATELETS: 168 10*3/uL (ref 145–400)
RBC: 4.27 10*6/uL (ref 3.70–5.45)
RDW: 14.8 % — ABNORMAL HIGH (ref 11.2–14.5)
WBC: 3.8 10*3/uL — ABNORMAL LOW (ref 3.9–10.3)
lymph#: 1.3 10*3/uL (ref 0.9–3.3)

## 2016-06-02 NOTE — Telephone Encounter (Signed)
She is fine to proceed with colonoscopy, no special instruction from me. Thanks.   Truitt Merle MD

## 2016-06-02 NOTE — Telephone Encounter (Signed)
Patient called and left message.  She saw Dr. Burr Medico today and forgot to tell her she is having her colonoscopy March 12,2018.  This will be her second one.  If there is anything special she needs to do from Dr. Ernestina Penna perspective, please let her know.  Call back is [phone].   Otherwise she is just letting Dr. Burr Medico know for her own "edification."

## 2016-06-03 ENCOUNTER — Other Ambulatory Visit: Payer: Self-pay | Admitting: Hematology

## 2016-06-03 ENCOUNTER — Telehealth: Payer: Self-pay | Admitting: Hematology

## 2016-06-03 DIAGNOSIS — Z9012 Acquired absence of left breast and nipple: Secondary | ICD-10-CM

## 2016-06-03 DIAGNOSIS — Z1231 Encounter for screening mammogram for malignant neoplasm of breast: Secondary | ICD-10-CM

## 2016-06-03 NOTE — Telephone Encounter (Signed)
"  I missed a call from Dr. Ernestina Penna nurse."    Notified patient of Dr. Ernestina Penna answer.  No further questions.

## 2016-06-03 NOTE — Telephone Encounter (Signed)
Called patient to inform her of next scheduled appointments per 2/28 LOS.

## 2016-06-09 ENCOUNTER — Encounter (HOSPITAL_COMMUNITY): Payer: Self-pay | Admitting: *Deleted

## 2016-06-14 ENCOUNTER — Ambulatory Visit (HOSPITAL_COMMUNITY): Payer: Managed Care, Other (non HMO) | Admitting: Anesthesiology

## 2016-06-14 ENCOUNTER — Encounter (HOSPITAL_COMMUNITY)
Admission: RE | Disposition: A | Discharge status: Home / Self Care | Payer: Self-pay | Source: Ambulatory Visit | Attending: Gastroenterology

## 2016-06-14 ENCOUNTER — Ambulatory Visit (HOSPITAL_COMMUNITY)
Admission: RE | Admit: 2016-06-14 | Discharge: 2016-06-14 | Disposition: A | Discharge status: Home / Self Care | Payer: Managed Care, Other (non HMO) | Source: Ambulatory Visit | Attending: Gastroenterology | Admitting: Gastroenterology

## 2016-06-14 ENCOUNTER — Encounter (HOSPITAL_COMMUNITY): Payer: Self-pay | Admitting: *Deleted

## 2016-06-14 DIAGNOSIS — Z882 Allergy status to sulfonamides status: Secondary | ICD-10-CM | POA: Diagnosis not present

## 2016-06-14 DIAGNOSIS — Z853 Personal history of malignant neoplasm of breast: Secondary | ICD-10-CM | POA: Insufficient documentation

## 2016-06-14 DIAGNOSIS — Z90710 Acquired absence of both cervix and uterus: Secondary | ICD-10-CM | POA: Diagnosis not present

## 2016-06-14 DIAGNOSIS — Z880 Allergy status to penicillin: Secondary | ICD-10-CM | POA: Insufficient documentation

## 2016-06-14 DIAGNOSIS — E119 Type 2 diabetes mellitus without complications: Secondary | ICD-10-CM | POA: Diagnosis not present

## 2016-06-14 DIAGNOSIS — I10 Essential (primary) hypertension: Secondary | ICD-10-CM | POA: Diagnosis not present

## 2016-06-14 DIAGNOSIS — E7800 Pure hypercholesterolemia, unspecified: Secondary | ICD-10-CM | POA: Diagnosis not present

## 2016-06-14 DIAGNOSIS — Z1211 Encounter for screening for malignant neoplasm of colon: Secondary | ICD-10-CM | POA: Insufficient documentation

## 2016-06-14 HISTORY — PX: COLONOSCOPY WITH PROPOFOL: SHX5780

## 2016-06-14 HISTORY — DX: Urinary tract infection, site not specified: N39.0

## 2016-06-14 LAB — GLUCOSE, CAPILLARY: GLUCOSE-CAPILLARY: 86 mg/dL (ref 65–99)

## 2016-06-14 SURGERY — COLONOSCOPY WITH PROPOFOL
Anesthesia: Monitor Anesthesia Care

## 2016-06-14 MED ORDER — LACTATED RINGERS IV SOLN
INTRAVENOUS | Status: DC
  Administered 2016-06-14: 1000 mL via INTRAVENOUS

## 2016-06-14 MED ORDER — LIDOCAINE 2% (20 MG/ML) 5 ML SYRINGE
INTRAMUSCULAR | Status: DC | PRN
  Administered 2016-06-14: 100 mg via INTRAVENOUS

## 2016-06-14 MED ORDER — ONDANSETRON HCL 4 MG/2ML IJ SOLN
INTRAMUSCULAR | Status: AC
  Filled 2016-06-14: qty 2

## 2016-06-14 MED ORDER — PROPOFOL 10 MG/ML IV BOLUS
INTRAVENOUS | Status: DC | PRN
  Administered 2016-06-14: 20 mg via INTRAVENOUS

## 2016-06-14 MED ORDER — PROPOFOL 10 MG/ML IV BOLUS
INTRAVENOUS | Status: AC
  Filled 2016-06-14: qty 40

## 2016-06-14 MED ORDER — SODIUM CHLORIDE 0.9 % IV SOLN: INTRAVENOUS | Status: DC

## 2016-06-14 MED ORDER — PROPOFOL 500 MG/50ML IV EMUL
INTRAVENOUS | Status: DC | PRN
  Administered 2016-06-14: 200 ug/kg/min via INTRAVENOUS

## 2016-06-14 MED ORDER — ONDANSETRON HCL 4 MG/2ML IJ SOLN
INTRAMUSCULAR | Status: DC | PRN
  Administered 2016-06-14: 4 mg via INTRAVENOUS

## 2016-06-14 SURGICAL SUPPLY — 22 items

## 2016-06-14 NOTE — H&P (Signed)
Procedure: Screening colonoscopy. Normal screening colonoscopy was performed on 06/17/2005  History: The patient is a 61 year old female born November 22, 1955. She is scheduled to undergo a repeat screening colonoscopy today.  Past medical history: Hysterectomy. Bilateral salpingo-oophorectomy. Breast cancer. Hypertension. Hypercholesterolemia. Endometriosis.  Medication allergies: Penicillin. Sulfa drugs. Crestor myalgia. Neosporin.  Exam: The patient is alert and lying comfortably on the endoscopy stretcher. Abdomen is soft and nontender to palpation. Lungs are clear to auscultation. Cardiac exam reveals a regular rhythm.  Plan: Proceed with screening colonoscopy

## 2016-06-14 NOTE — Op Note (Signed)
[facility] Patient Name: Rachel [name] Procedure Date: 06/14/2016 MRN: [id number] Attending MD: Garlan Fair , MD Date of Birth: April 04, 1956 CSN: [id number] Age: 61 Admit Type: Outpatient Procedure:                Colonoscopy Indications:              Screening for colorectal malignant neoplasm Providers:                Garlan Fair, MD, Cleda Daub, RN, Elspeth Cho Tech., Technician, Virgia Land, CRNA Referring MD:              Medicines:                Propofol per Anesthesia Complications:            No immediate complications. Estimated Blood Loss:     Estimated blood loss: none. Procedure:                Pre-Anesthesia Assessment:                           - Prior to the procedure, a History and Physical                            was performed, and patient medications and                            allergies were reviewed. The patient's tolerance of                            previous anesthesia was also reviewed. The risks                            and benefits of the procedure and the sedation                            options and risks were discussed with the patient.                            All questions were answered, and informed consent                            was obtained. Prior Anticoagulants: The patient has                            taken no previous anticoagulant or antiplatelet                            agents. ASA Grade Assessment: II - A patient with                            mild systemic disease. After reviewing the risks  and benefits, the patient was deemed in                            satisfactory condition to undergo the procedure.                           After obtaining informed consent, the colonoscope                            was passed under direct vision. Throughout the                            procedure, the patient's blood pressure, pulse, and                            oxygen saturations were monitored continuously. The                            EC-[id number] ([id number]) scope was introduced through                            the anus and advanced to the the cecum, identified                            by appendiceal orifice and ileocecal valve. The                            colonoscopy was performed without difficulty. The                            patient tolerated the procedure well. The quality                            of the bowel preparation was good. The appendiceal                            orifice and the rectum were photographed. Scope In: 9:52:13 AM Scope Out: 10:09:29 AM Scope Withdrawal Time: 0 hours 9 minutes 21 seconds  Total Procedure Duration: 0 hours 17 minutes 16 seconds  Findings:      The perianal and digital rectal examinations were normal.      The entire examined colon appeared normal. Impression:               - The entire examined colon is normal.                           - No specimens collected. Moderate Sedation:      N/A- Per Anesthesia Care Recommendation:           - Patient has a contact number available for                            emergencies. The signs and symptoms of potential  delayed complications were discussed with the                            patient. Return to normal activities tomorrow.                            Written discharge instructions were provided to the                            patient.                           - Repeat colonoscopy in 10 years for screening                            purposes.                           - Resume previous diet.                           - Continue present medications. Procedure Code(s):        --- Professional ---                           U7654, Colorectal cancer screening; colonoscopy on                            individual not meeting criteria for high risk Diagnosis Code(s):        --- Professional  ---                           Z12.11, Encounter for screening for malignant                            neoplasm of colon CPT copyright 2016 American Medical Association. All rights reserved. The codes documented in this report are preliminary and upon coder review may  be revised to meet current compliance requirements. Earle Gell, MD Garlan Fair, MD 06/14/2016 10:16:20 AM This report has been signed electronically. Number of Addenda: 0

## 2016-06-14 NOTE — Anesthesia Preprocedure Evaluation (Signed)
Anesthesia Evaluation  Patient identified by MRN, date of birth, ID band Patient awake    Reviewed: Allergy & Precautions, NPO status , Patient's Chart, lab work & pertinent test results  Airway Mallampati: II  TM Distance: >3 FB Neck ROM: Full    Dental no notable dental hx. (+) Teeth Intact   Pulmonary neg pulmonary ROS,    Pulmonary exam normal breath sounds clear to auscultation       Cardiovascular hypertension, Normal cardiovascular exam Rhythm:Regular Rate:Normal     Neuro/Psych negative neurological ROS  negative psych ROS   GI/Hepatic negative GI ROS, Neg liver ROS,   Endo/Other  diabetes  Renal/GU      Musculoskeletal   Abdominal   Peds  Hematology negative hematology ROS (+)   Anesthesia Other Findings   Reproductive/Obstetrics negative OB ROS                             Anesthesia Physical  Anesthesia Plan  ASA: II  Anesthesia Plan: MAC   Post-op Pain Management:    Induction: Intravenous  Airway Management Planned: Natural Airway and Simple Face Mask  Additional Equipment:   Intra-op Plan:   Post-operative Plan:   Informed Consent: I have reviewed the patients History and Physical, chart, labs and discussed the procedure including the risks, benefits and alternatives for the proposed anesthesia with the patient or authorized representative who has indicated his/her understanding and acceptance.     Plan Discussed with: CRNA and Surgeon  Anesthesia Plan Comments:         Anesthesia Quick Evaluation

## 2016-06-14 NOTE — Transfer of Care (Signed)
Immediate Anesthesia Transfer of Care Note  Patient: Rachel [name]  Procedure(s) Performed: Procedure(s): COLONOSCOPY WITH PROPOFOL (N/A)  Patient Location: PACU  Anesthesia Type:MAC  Level of Consciousness:  sedated, patient cooperative and responds to stimulation  Airway & Oxygen Therapy:Patient Spontanous Breathing and Patient connected to face mask oxgen  Post-op Assessment:  Report given to PACU RN and Post -op Vital signs reviewed and stable  Post vital signs:  Reviewed and stable  Last Vitals:  Vitals:   06/14/16 0826  BP: 135/73  Pulse: 68  Resp: 15  Temp: 09.9 C    Complications: No apparent anesthesia complications

## 2016-06-14 NOTE — Anesthesia Postprocedure Evaluation (Signed)
Anesthesia Post Note  Patient: Rachel [name]  Procedure(s) Performed: Procedure(s) (LRB): COLONOSCOPY WITH PROPOFOL (N/A)  Patient location during evaluation: PACU Anesthesia Type: MAC Level of consciousness: awake and alert Pain management: pain level controlled Vital Signs Assessment: post-procedure vital signs reviewed and stable Respiratory status: spontaneous breathing, nonlabored ventilation and respiratory function stable Cardiovascular status: stable and blood pressure returned to baseline Anesthetic complications: no       Last Vitals:  Vitals:   06/14/16 0826 06/14/16 1017  BP: 135/73 (!) 104/56  Pulse: 68 70  Resp: 15 15  Temp: 36.6 C 36.4 C    Last Pain:  Vitals:   06/14/16 1017  TempSrc: Oral                 Lynda Rainwater

## 2016-06-14 NOTE — Discharge Instructions (Signed)
YOU HAD AN ENDOSCOPIC PROCEDURE TODAY: Refer to the procedure report and other information in the discharge instructions given to you for any specific questions about what was found during the examination. If this information does not answer your questions, please call Dr. Wynetta Emery office at [phone] to clarify.   YOU SHOULD EXPECT: Some feelings of bloating in the abdomen. Passage of more gas than usual. Walking can help get rid of the air that was put into your GI tract during the procedure and reduce the bloating.Some abdominal soreness may be present for a day or two, also.  DIET: Your first meal following the procedure should be a light meal and then it is ok to progress to your normal diet. A half-sandwich or bowl of soup is an example of a good first meal. Heavy or fried foods are harder to digest and may make you feel nauseous or bloated. Drink plenty of fluids but you should avoid alcoholic beverages for 24 hours. If you had a esophageal dilation, please see attached instructions for diet.   ACTIVITY: Your care partner should take you home directly after the procedure. You should plan to take it easy, moving slowly for the rest of the day. You can resume normal activity the day after the procedure however YOU SHOULD NOT DRIVE, use power tools, machinery or perform tasks that involve climbing or major physical exertion for 24 hours (because of the sedation medicines used during the test).   SYMPTOMS TO REPORT IMMEDIATELY: A gastroenterologist can be reached at any hour. Please call [phone] for any of the following symptoms:  Following lower endoscopy (colonoscopy, flexible sigmoidoscopy) Excessive amounts of blood in the stool  Significant tenderness, worsening of abdominal pains  Swelling of the abdomen that is new, acute  Fever of 100 or higher    FOLLOW UP:  If any biopsies were taken you will be contacted by phone or by letter within the next 1-3 weeks. Call [phone]  if  you have not heard about the biopsies in 3 weeks.  Please also call with any specific questions about appointments or follow up tests.

## 2016-06-16 ENCOUNTER — Encounter (HOSPITAL_COMMUNITY): Payer: Self-pay | Admitting: Gastroenterology

## 2016-06-18 ENCOUNTER — Telehealth: Payer: Self-pay | Admitting: *Deleted

## 2016-06-18 NOTE — Telephone Encounter (Signed)
Pt called and left message informing nurse re:  Pt had colonoscopy on Monday 06/14/16.   Pt was told that results were  " free and clear ".  Pt just wanted to make Dr. Burr Medico aware.

## 2016-09-10 ENCOUNTER — Other Ambulatory Visit: Payer: Self-pay | Admitting: *Deleted

## 2016-09-10 DIAGNOSIS — C50412 Malignant neoplasm of upper-outer quadrant of left female breast: Secondary | ICD-10-CM

## 2016-09-10 MED ORDER — ANASTROZOLE 1 MG PO TABS: 1.0000 mg | ORAL_TABLET | Freq: Every day | ORAL | 3 refills | Status: DC

## 2016-09-10 NOTE — Telephone Encounter (Signed)
Pt called for refill on anastrozole to be sent to [place].  This was done.

## 2016-10-14 NOTE — Anesthesia Postprocedure Evaluation (Signed)
Anesthesia Post Note  Patient: Rachel [name]  Procedure(s) Performed: Procedure(s) (LRB): COLONOSCOPY WITH PROPOFOL (N/A)     Anesthesia Post Evaluation  Last Vitals:  Vitals:   06/14/16 1025 06/14/16 1030  BP: 115/60 (!) 117/58  Pulse: 69 74  Resp: 14 18  Temp:      Last Pain:  Vitals:   06/14/16 1017  TempSrc: Oral                 Lynda Rainwater

## 2016-10-14 NOTE — Addendum Note (Signed)
Addendum  created 10/14/16 1638 by Lynda Rainwater, MD   Sign clinical note

## 2016-11-18 ENCOUNTER — Ambulatory Visit
Admission: RE | Admit: 2016-11-18 | Discharge: 2016-11-18 | Disposition: A | Discharge status: Home / Self Care | Payer: Managed Care, Other (non HMO) | Source: Ambulatory Visit | Attending: Hematology | Admitting: Hematology

## 2016-11-18 DIAGNOSIS — Z9012 Acquired absence of left breast and nipple: Secondary | ICD-10-CM

## 2016-11-18 DIAGNOSIS — Z1231 Encounter for screening mammogram for malignant neoplasm of breast: Secondary | ICD-10-CM

## 2016-11-18 HISTORY — DX: Personal history of antineoplastic chemotherapy: Z92.21

## 2016-11-18 HISTORY — DX: Personal history of irradiation: Z92.3

## 2016-12-01 ENCOUNTER — Other Ambulatory Visit: Payer: Managed Care, Other (non HMO)

## 2016-12-01 ENCOUNTER — Ambulatory Visit: Payer: Managed Care, Other (non HMO) | Admitting: Hematology

## 2016-12-07 NOTE — Progress Notes (Signed)
[facility] OFFICE PROGRESS NOTE  Patient Care Team: Leeroy Cha, MD as PCP - General (Internal Medicine) [name], MD as Consulting Physician (General Surgery) Truitt Merle, MD as Consulting Physician (Hematology) Arloa Koh, MD as Consulting Physician (Radiation Oncology)  SUMMARY OF ONCOLOGIC HISTORY: Oncology History    Breast cancer of upper-outer quadrant of left female breast   Staging form: Breast, AJCC 7th Edition     Clinical: Stage IIB (T3, N0, cM0) - Unsigned       Staging comments: Staged at breast conference 8.5.15      Pathologic stage from 05/08/2014: Stage IIA (yT1c(m), N1a, cM0) - Signed by Seward Grater, MD on 05/15/2014       Staging comments: Staged on final mastectomy specimen by Dr. Donato Heinz        Breast cancer of upper-outer quadrant of left female breast ([place])   10/17/2013 Mammogram    Left breast: persistent distortion in the UOQ, corresponding to the screening mammographic finding.       10/26/2013 Breast US    Left breast: hypoechoic irregular mass with surrounding distortion in the left breast 2 o'clock location 4 cm from the nipple measuring 1.1 x 0.9 x 0.7 cm. This corresponds to the mammographic finding. No left axillary LAD      10/30/2013 Initial Biopsy    Left breast core needle bx: Invasive ductal carcinoma, grade 2, ER+ (100%), PR+ (42%), HER2neu positive (ratio 2.8), Ki67 53%, DCIS with calcifications      11/05/2013 Breast MRI    Irregular enhancing mass measuring 5.1 cm in the upper and upper-outer quadrants of the left breast. The MR enhancing mass is much larger than the mass seen mammographically or sonographically.       11/05/2013 Breast MRI    Suspicious level 1 left axillary lymph node concerning for metastatic involving. But [place] negative, no node biopsy.       11/07/2013 Clinical Stage    Stage IIB: T2 N0 cM0      11/26/2013 - 11/26/2014 Neo-Adjuvant Chemotherapy    docetaxel, carboplatin, pertuzumab, and  transtuzumab x 6 cycles (11/26/13-03/11/14) followed by mainteance trastuzumab (04/08/14-11/26/14)  to complete 1 year of therapy      05/08/2014 Surgery    Left mastectomy/SLNB Barry Dienes):  Multifocal invasive ductal carcinoma (0.5 cm, 1.4 cm), DCIS, 2/2 SLN were positive.      05/08/2014 Pathologic Stage    Stage IIA: ympT1c pN1a       07/10/2014 - 08/23/2014 Radiation Therapy    Adjuvant RT Valere Dross): Left chest wall/regional lymph nodes 50.4 Gy over 28 fractions; left chest wall boost 10 Gy over 5 fractions      08/22/2014 Procedure    port removal. Tip culture (+) Pseudomonas.       09/2014 -  Anti-estrogen oral therapy    Anastrozole 1 mg daily.  Planned duration of therapy 10 years Burr Medico).      01/16/2015 Survivorship    Survivorship visit completed and copy of care plan provided to patient.        CURRENT THERAPY: Anastrozole 1 mg once daily, started in June 2016.  INTERVAL HISTORY:  Rachel [name] returns for follow up. The patient is currently on Anastrozole. She presents to the clinic today noting she has been overall well. She exercises 4-5 days a week and is active with different exercises. She denies joint issues. She is tolerating anastrozole no problems. She had her bone density on the 20th and Mammogram on the 16th. She did  sign medical records to send to Dr. Burr Medico.  She denies shooting pain or numbness in her breasts.  She reports her cholesterol is down to 163 while on Lipitor.    REVIEW OF SYSTEMS:   Constitutional: Denies fevers, chills Eyes: Denies blurriness of vision Ears, nose, mouth, throat, and face: Denies mucositis or sore throat Respiratory: Denies cough, dyspnea or wheezes Cardiovascular: Denies palpitation, chest discomfort or lower extremity swelling Gastrointestinal:  Denies nausea, heartburn or change in bowel habits Skin: Denies abnormal skin rashes except a blister at the old port site Lymphatics: Denies new lymphadenopathy or easy  bruising Neurological:Denies numbness, tingling or new weaknesses Behavioral/Psych: Mood is stable, no new changes  All other systems were reviewed with the patient and are negative except those in history.  MEDICAL HISTORY:  Past Medical History:  Diagnosis Date  . Alopecia areata   . Anemia due to chemotherapy   . Breast cancer [facility]) oncologist-  dr Krista Blue Quenna Doepke/  dx 07/ 2015--  DCIS left breast cT3, N0, Stage IIB  triple positive ,  ER+PR+HER2+/      s/p  chemotherapy 11-26-2013 to 03-11-2014 (after chemo, yT1cN1a)--  s/p  left mastectomy w/ SLN bx 05-08-2014/  radiation therapy currently started 07-10-2014  . History of endometriosis   . Hyperlipidemia   . Hypertension   . Infection due to port-a-cath    left chest area w/ cellulitis and skin ulcer   . 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 Personal history of chemotherapy   . Personal history of radiation therapy   . Recurrent UTI last uti   oct 2017  . S/P radiation therapy 07/10/2014 through 08/23/2014   Left chest wall/regional lymph nodes 5040 cGy in 28 sessions, left chest wall boost 1000 cGy in 5 sessions   . Status post chemotherapy 11/25/2013 - 03/11/2014   6 cycles of neoadjuvant chemotherapy with TCH-P (Taxotere, Carbolatin, Herceptin and Perjeta followed by Neulasta  . Type 2 diabetes, diet controlled ([place])     SURGICAL HISTORY: Past Surgical History:  Procedure Laterality Date  . ABDOMINAL HYSTERECTOMY  1995   and 1996 Laparosopic Bilateral Salpingoophorectomy  . BREAST SURGERY Left 7/15   bx  . COLONOSCOPY  10-11 yrs ago  . COLONOSCOPY WITH PROPOFOL N/A 06/14/2016   Procedure: COLONOSCOPY WITH PROPOFOL;  Surgeon: Garlan Fair, MD;  Location: [facility];  Service: Endoscopy;  Laterality: N/A;  . MASTECTOMY Left   . MASTECTOMY W/ SENTINEL NODE BIOPSY Left 05/08/2014   Procedure: LEFT MASTECTOMY WITH SENTINEL LYMPH  NODE BIOPSY;  Surgeon: [name], MD;  Location: [place];  Service: General;  Laterality: Left;  . PORT-A-CATH REMOVAL Left 08/22/2014   Procedure: REMOVAL PORT-A-CATH;  Surgeon: [name], MD;  Location: [facility];  Service: General;  Laterality: Left;  . PORTACATH PLACEMENT N/A 11/20/2013   Procedure: INSERTION PORT-A-CATH;  Surgeon: [name], MD;  Location: [place];  Service: General;  Laterality: N/A;  . TRANSTHORACIC ECHOCARDIOGRAM  05-30-2014   grade I diastolic dysfunction/ ef 75-10%/  moderate diffuse calcification AV without stenosis/  trivial MR and TR    I have reviewed the social history and family history with the patient and they are unchanged from previous note.  ALLERGIES:  is allergic to eggs or egg-derived products; nickel; sulfa antibiotics; neosporin [neomycin-bacitracin zn-polymyx]; acyclovir and related; peanut-containing drug products; rosuvastatin; multihance [gadobenate]; penicillins; and tetanus toxoids.  MEDICATIONS:  Allergies as of 12/08/2016      Reactions  Eggs Or Egg-derived Products Nausea Only   "has never had a flu shot"   Nickel Hives   Sulfa Antibiotics Swelling   Neosporin [neomycin-bacitracin Zn-polymyx] Swelling   Swelling of area, redness   Contains sulfur   Acyclovir And Related    Peanut-containing Drug Products    All nuts   Rosuvastatin    Other reaction(s): myalgia   Multihance [gadobenate] Nausea And Vomiting   Contrast dye   Penicillins Rash   Tetanus Toxoids Swelling, Rash      Medication List       Accurate as of 12/08/16 11:30 PM. Always use your most recent med list.          acetaminophen 500 MG tablet Commonly known as:  TYLENOL Take 500 mg by mouth every 6 (six) hours as needed (pain).   anastrozole 1 MG tablet Commonly known as:  ARIMIDEX Take 1 tablet (1 mg total) by mouth daily.   atorvastatin 20 MG tablet Commonly known as:  LIPITOR   betamethasone (augmented) 0.05 % lotion Commonly known  as:  DIPROLENE Apply topically 2 (two) times daily.   calcium citrate-vitamin D 315-200 MG-UNIT tablet Commonly known as:  CITRACAL+D Take 1 tablet by mouth every other day.   Fish Oil 1000 MG Caps Take 1 capsule by mouth daily.   hydrochlorothiazide 25 MG tablet Commonly known as:  HYDRODIURIL Take 25 mg by mouth every morning.   multivitamin with minerals Tabs tablet Take 1 tablet by mouth daily.   REPLENS Gel Place 1 application vaginally. Uses every three to 5 days   SOLUBLE FIBER/PROBIOTICS PO Take 1 tablet by mouth daily. 1 billion active culture tablet  -  Per pt.   vitamin E 400 UNIT capsule Generic drug:  vitamin E Take 400 Units by mouth daily.        PHYSICAL EXAMINATION: ECOG PERFORMANCE STATUS: 0 BP 134/85 (BP Location: Right Arm, Patient Position: Sitting)   Pulse 76   Temp 98.2 F (36.8 C) (Oral)   Resp 18   Ht '5\' 5"'  (1.651 m)   Wt 164 lb 6.4 oz (74.6 kg)   SpO2 99%   BMI 27.36 kg/m    GENERAL:alert, no distress and comfortable. SKIN: skin color, texture, turgor are normal, no rashes or significant lesions except  (+) skin pigmentation at the left mastectomy site secondary to radiation. EYES: normal, Conjunctiva are pink and non-injected, sclera clear OROPHARYNX:no exudate, no erythema and lips, buccal mucosa, and tongue normal  NECK: supple, thyroid normal size, non-tender, without nodularity LYMPH:  no palpable lymphadenopathy in the cervical, axillary or inguinal LUNGS: clear to auscultation and percussion with normal breathing effort HEART: regular rate & rhythm and no murmurs and no lower extremity edema ABDOMEN:abdomen soft, non-tender and normal bowel sounds Musculoskeletal:no cyanosis of digits and no clubbing  NEURO: alert & oriented x 3 with fluent speech, no focal motor/sensory deficits BREASTS: Status post left mastectomy, surgical incision has well healed, small scar at the previous port site.  No palpable mass on the left chest wall  or axilla. Palpation of the right breasts and axilla revealed no obvious mass that I could appreciate. EXTREMITIES: no edema  LABORATORY DATA:  I have reviewed the data as listed. CBC Latest Ref Rng & Units 12/08/2016 06/02/2016 01/28/2016  WBC 3.9 - 10.3 10e3/uL 4.4 3.8(L) 4.4  Hemoglobin 11.6 - 15.9 g/dL 12.8 12.5 12.6  Hematocrit 34.8 - 46.6 % 38.2 37.6 39.2  Platelets 145 - 400 10e3/uL 145 168 171  CMP Latest Ref Rng & Units 12/08/2016 06/02/2016 01/28/2016  Glucose 70 - 140 mg/dl 89 92 107  BUN 7.0 - 26.0 mg/dL 16.4 15.6 15.9  Creatinine 0.6 - 1.1 mg/dL 1.0 0.9 0.9  Sodium 136 - 145 mEq/L 143 143 144  Potassium 3.5 - 5.1 mEq/L 4.3 4.1 3.8  Chloride 101 - 111 mmol/L - - -  CO2 22 - 29 mEq/L 30(H) 32(H) 29  Calcium 8.4 - 10.4 mg/dL 9.8 10.0 9.6  Total Protein 6.4 - 8.3 g/dL 7.1 7.0 7.0  Total Bilirubin 0.20 - 1.20 mg/dL 0.84 0.84 0.75  Alkaline Phos 40 - 150 U/L 73 88 95  AST 5 - 34 U/L '21 22 17  ' ALT 0 - 55 U/L '17 20 14   ' PATHOLOGY REPORT: Diagnosis 05/08/2014 1. Breast, simple mastectomy, left  - PENDING STAINS. - MULTIFOCAL INVASIVE DUCTAL CARCINOMA WITH NEOADJUVANT RELATED CHANGES, SEE COMMENT. - NEGATIVE FOR LYMPH VASCULAR INVASION. - INVASIVE TUMOR IS 4 MM FROM NEAREST MARGIN (ANTERIOR). - DUCTAL CARCINOMA IN SITU - PREVIOUS BIOPSY SITE. - BENIGN SKIN; NEGATIVE FOR TUMOR - SEE TUMOR SYNOPTIC TEMPLATE BELOW. 2. Lymph node, sentinel, biopsy, left axillary #1 - ONE LYMPH NODE, POSITIVE FOR METASTATIC MAMMARY CARCINOMA (1/1). - INTRANODAL TUMOR DEPOSIT IS 2 MM - NEGATIVE FOR EXTRACAPSULAR TUMOR EXTENSION. 3. Lymph node, sentinel, biopsy, left axillary #2 - ONE LYMPH NODE, POSITIVE FOR METASTATIC MAMMARY CARCINOMA (1/1). - INTRANODAL TUMOR DEPOSIT IS 4 MM - NEGATIVE FOR EXTRACAPSULAR TUMOR EXTENSION.   PROCEDURES  ECHO 11/12/2014 Study Conclusions - Left ventricle: The cavity size was normal. Wall thickness was increased in a pattern of mild LVH. There was focal  basal hypertrophy. Systolic function was normal. The estimated ejection fraction was in the range of 60% to 65%. Wall motion was normal; there were no regional wall motion abnormalities. Doppler parameters are consistent with abnormal left ventricular relaxation (grade 1 diastolic dysfunction).   RADIOGRAPHIC STUDIES: I have personally reviewed the radiological images as listed and agreed with the findings in the report.  Screening Mammogram 11/18/16 IMPRESSION: No mammographic evidence of malignancy. A result letter of this screening mammogram will be mailed directly to the patient. RECOMMENDATION: Screening mammogram in one year  Mammogram 11/18/2015 IMPRESSION: No mammographic evidence of malignancy. A result letter of this screening mammogram will be mailed directly to the patient.   ASSESSMENT & PLAN:  This is a very pleasant 61 years-old African [place] female from [facility] who presents with a left Invasive Ductal Carcinoma. This was picked on a mammogram and maximal diameter was 1.7 cm on Ultrasound however the MRI breast showed this mass to be large almost 5.1 cm in maximal dimension making it a clinical T3 tumor. Also it showed a suspicious looking solitary lymph node in axilla but [place] was negative, not biopsied.  Clinical T3N0M0 Stage IIB.  The tumor biology indicates a LUMINAL-B type which means ER/PR/HER2 + tumor. Tumor is strongly ER+100%, PR 42%,KI-67 Index 53% showing a brisk mitotic rate and grade is called grade 2 (but grading as per pathologist cannot be relied upon on initial biopsy).   1. 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  -I previously discussed her surgical pathology findings with her in details. Unfortunately she had metastasis to 2 Sentinel lymph nodes. Axillary lymph node  dissection was discussed, Dr. Barry Dienes, Dr. Valere Dross and we recommend to proceed ALND,  which is the current standard care. She however declined surgery due to the concern of lymphedema. She opted adjuvant radiation including the axillary area.  -She is now on adjuvant anastrozole, tolerating well, we'll continue. Recent clinical data has shown no significant difference on extended aromatase inhibitor for 7 vs 10 years, given her advanced stage and relative young age, I recommend her to take it for total of 7 years. -It has been nearly 3 years since her initial diagnosis. -She is clinically doing very well, I reviewed her lab results today, CBC and CPM are within normal limits. Her physical exam was unremarkable today. 2018 mammogram was normal. No evidence for recurrence so far. -I'll see her every 6 months. We'll continue breast cancer surveillance with annual mammogram -I again encouraged her to continue healthy diet and exercise regularly. She is very compliant on that.   2. Bone health  - I again encouraged her to continue taking calcium and vitamin D. -We previously discussed the potential side effects of osteoporosis from anastrozole -Her last bone density scan was done on 11/19/2014, which showed normal bone density.  -Will contact [facility] for her 2018 Bone scan.    3. HTN, DM, hypercholesterolemia -Follow up with primary care physician -We previously discussed the diet and exercise for hypercholesterolemia. She cannot tolerate statin, I encouraged her to discuss with her primary care physician about other cholesterol medicine. -I encouraged the patient to continue to stay active.   Plan -Continue anastrozole -Lab and f/u in 6 months  -contact her PCP at [facility] for Pt Bone scan, will call her with results -No refills needed today.   All questions were answered. The patient knows to call the clinic with any problems, questions or concerns. No barriers to learning was  detected.  I spent 20 minutes counseling the patient face to face. The total time spent in the appointment was 25 minutes and more than 50% was on counseling and review of test results  This document serves as a record of services personally performed by Truitt Merle, MD. It was created on her behalf by Joslyn Devon, a trained medical scribe. The creation of this record is based on the scribe's personal observations and the provider's statements to them. This document has been checked and approved by the attending provider.     Truitt Merle, MD 12/08/2016

## 2016-12-08 ENCOUNTER — Ambulatory Visit (HOSPITAL_BASED_OUTPATIENT_CLINIC_OR_DEPARTMENT_OTHER): Payer: Managed Care, Other (non HMO) | Admitting: Hematology

## 2016-12-08 ENCOUNTER — Encounter: Payer: Self-pay | Admitting: Hematology

## 2016-12-08 ENCOUNTER — Other Ambulatory Visit (HOSPITAL_BASED_OUTPATIENT_CLINIC_OR_DEPARTMENT_OTHER): Payer: Managed Care, Other (non HMO)

## 2016-12-08 ENCOUNTER — Telehealth: Payer: Self-pay | Admitting: Hematology

## 2016-12-08 VITALS — BP 134/85 | HR 76 | Temp 98.2°F | Resp 18 | Ht 65.0 in | Wt 164.4 lb

## 2016-12-08 DIAGNOSIS — Z170 Estrogen receptor positive status [ER+]: Secondary | ICD-10-CM | POA: Diagnosis not present

## 2016-12-08 DIAGNOSIS — I10 Essential (primary) hypertension: Secondary | ICD-10-CM

## 2016-12-08 DIAGNOSIS — C50412 Malignant neoplasm of upper-outer quadrant of left female breast: Secondary | ICD-10-CM | POA: Diagnosis not present

## 2016-12-08 DIAGNOSIS — E119 Type 2 diabetes mellitus without complications: Secondary | ICD-10-CM | POA: Diagnosis not present

## 2016-12-08 LAB — CBC WITH DIFFERENTIAL/PLATELET
BASO%: 0.7 % (ref 0.0–2.0)
BASOS ABS: 0 10*3/uL (ref 0.0–0.1)
EOS%: 1.4 % (ref 0.0–7.0)
Eosinophils Absolute: 0.1 10*3/uL (ref 0.0–0.5)
HEMATOCRIT: 38.2 % (ref 34.8–46.6)
HGB: 12.8 g/dL (ref 11.6–15.9)
LYMPH%: 32 % (ref 14.0–49.7)
MCH: 29.9 pg (ref 25.1–34.0)
MCHC: 33.5 g/dL (ref 31.5–36.0)
MCV: 89.3 fL (ref 79.5–101.0)
MONO#: 0.4 10*3/uL (ref 0.1–0.9)
MONO%: 10 % (ref 0.0–14.0)
NEUT#: 2.5 10*3/uL (ref 1.5–6.5)
NEUT%: 55.9 % (ref 38.4–76.8)
Platelets: 145 10*3/uL (ref 145–400)
RBC: 4.28 10*6/uL (ref 3.70–5.45)
RDW: 14.8 % — ABNORMAL HIGH (ref 11.2–14.5)
WBC: 4.4 10*3/uL (ref 3.9–10.3)
lymph#: 1.4 10*3/uL (ref 0.9–3.3)

## 2016-12-08 LAB — COMPREHENSIVE METABOLIC PANEL
ALT: 17 U/L (ref 0–55)
AST: 21 U/L (ref 5–34)
Albumin: 3.8 g/dL (ref 3.5–5.0)
Alkaline Phosphatase: 73 U/L (ref 40–150)
Anion Gap: 7 mEq/L (ref 3–11)
BUN: 16.4 mg/dL (ref 7.0–26.0)
CALCIUM: 9.8 mg/dL (ref 8.4–10.4)
CHLORIDE: 106 meq/L (ref 98–109)
CO2: 30 mEq/L — ABNORMAL HIGH (ref 22–29)
Creatinine: 1 mg/dL (ref 0.6–1.1)
EGFR: 72 mL/min/{1.73_m2} — ABNORMAL LOW (ref >90)
Glucose: 89 mg/dl (ref 70–140)
POTASSIUM: 4.3 meq/L (ref 3.5–5.1)
SODIUM: 143 meq/L (ref 136–145)
Total Bilirubin: 0.84 mg/dL (ref 0.20–1.20)
Total Protein: 7.1 g/dL (ref 6.4–8.3)

## 2016-12-08 NOTE — Telephone Encounter (Signed)
Scheduled appt per 9/5 los - Gave patient AVS and calender - lab and f/u in 6 months.

## 2016-12-11 ENCOUNTER — Encounter: Payer: Self-pay | Admitting: Hematology

## 2016-12-13 ENCOUNTER — Telehealth: Payer: Self-pay | Admitting: *Deleted

## 2016-12-13 NOTE — Telephone Encounter (Signed)
Pt was informed on DEXA results - Per Dr. Burr Medico- Pt is osteopenic, pt  asked to continue Ca+ and Vit D

## 2017-01-10 ENCOUNTER — Encounter: Payer: Self-pay | Admitting: Hematology

## 2017-05-29 NOTE — Progress Notes (Signed)
[facility] OFFICE PROGRESS NOTE  Patient Care Team: Leeroy Cha, MD as PCP - General (Internal Medicine) [name], MD as Consulting Physician (General Surgery) Truitt Merle, MD as Consulting Physician (Hematology) Arloa Koh, MD as Consulting Physician (Radiation Oncology)  SUMMARY OF ONCOLOGIC HISTORY: Oncology History    Breast cancer of upper-outer quadrant of left female breast   Staging form: Breast, AJCC 7th Edition     Clinical: Stage IIB (T3, N0, cM0) - Unsigned       Staging comments: Staged at breast conference 8.5.15      Pathologic stage from 05/08/2014: Stage IIA (yT1c(m), N1a, cM0) - Signed by Seward Grater, MD on 05/15/2014       Staging comments: Staged on final mastectomy specimen by Dr. Donato Heinz        Breast cancer of upper-outer quadrant of left female breast ([place])   10/17/2013 Mammogram    Left breast: persistent distortion in the UOQ, corresponding to the screening mammographic finding.       10/26/2013 Breast US    Left breast: hypoechoic irregular mass with surrounding distortion in the left breast 2 o'clock location 4 cm from the nipple measuring 1.1 x 0.9 x 0.7 cm. This corresponds to the mammographic finding. No left axillary LAD      10/30/2013 Initial Biopsy    Left breast core needle bx: Invasive ductal carcinoma, grade 2, ER+ (100%), PR+ (42%), HER2neu positive (ratio 2.8), Ki67 53%, DCIS with calcifications      11/05/2013 Breast MRI    Irregular enhancing mass measuring 5.1 cm in the upper and upper-outer quadrants of the left breast. The MR enhancing mass is much larger than the mass seen mammographically or sonographically.       11/05/2013 Breast MRI    Suspicious level 1 left axillary lymph node concerning for metastatic involving. But [place] negative, no node biopsy.       11/07/2013 Clinical Stage    Stage IIB: T2 N0 cM0      11/26/2013 - 11/26/2014 Neo-Adjuvant Chemotherapy    docetaxel, carboplatin, pertuzumab, and  transtuzumab x 6 cycles (11/26/13-03/11/14) followed by mainteance trastuzumab (04/08/14-11/26/14)  to complete 1 year of therapy      05/08/2014 Surgery    Left mastectomy/SLNB Barry Dienes):  Multifocal invasive ductal carcinoma (0.5 cm, 1.4 cm), DCIS, 2/2 SLN were positive.      05/08/2014 Pathologic Stage    Stage IIA: ympT1c pN1a       07/10/2014 - 08/23/2014 Radiation Therapy    Adjuvant RT Valere Dross): Left chest wall/regional lymph nodes 50.4 Gy over 28 fractions; left chest wall boost 10 Gy over 5 fractions      08/22/2014 Procedure    port removal. Tip culture (+) Pseudomonas.       09/2014 -  Anti-estrogen oral therapy    Anastrozole 1 mg daily.  Planned duration of therapy 10 years Burr Medico).      01/16/2015 Survivorship    Survivorship visit completed and copy of care plan provided to patient.        CURRENT THERAPY: Anastrozole 1 mg once daily, started in June 2016.  INTERVAL HISTORY:  Rachel [name] returns for follow up having last been seen on 12/08/2016.. She presents to the clinic today by herself. She reports that she continues to do well with no issues of concern.  She will be moving to [place] soon to be close to her daughter, grandchild, and brother-in-law.  A CBC and chemistry panel were collected today  with results returning showing no acute changes.  She had a right unilateral mammogram completed on 11/18/2016 with results showing no evidence of malignancy.  Recommendations were for a repeat mammogram in 1 year.  Her last bone density scan was completed on 11/19/2014 returned normal.  She is complaint with Anastrozole and reports no adverse reactions.  On review of systems, pt denies any issues of concern at this time. Pertinent positives are listed and detailed within the above HPI.   REVIEW OF SYSTEMS:    Constitutional: Denies fevers, chills Eyes: Denies blurriness of vision Ears, nose, mouth, throat, and face: Denies mucositis or sore throat Respiratory:  Denies cough, dyspnea or wheezes Cardiovascular: Denies palpitation, chest discomfort or lower extremity swelling Gastrointestinal:  Denies nausea, heartburn or change in bowel habits Skin: Denies abnormal skin rashes except a blister at the old port site Lymphatics: Denies new lymphadenopathy or easy bruising Neurological:Denies numbness, tingling or new weaknesses Behavioral/Psych: Mood is stable, no new changes  All other systems were reviewed with the patient and are negative except those in history.  MEDICAL HISTORY:  Past Medical History:  Diagnosis Date  . Alopecia areata   . Anemia due to chemotherapy   . Breast cancer [facility]) oncologist-  dr Krista Blue feng/  dx 07/ 2015--  DCIS left breast cT3, N0, Stage IIB  triple positive ,  ER+PR+HER2+/      s/p  chemotherapy 11-26-2013 to 03-11-2014 (after chemo, yT1cN1a)--  s/p  left mastectomy w/ SLN bx 05-08-2014/  radiation therapy currently started 07-10-2014  . History of endometriosis   . Hyperlipidemia   . Hypertension   . Infection due to port-a-cath    left chest area w/ cellulitis and skin ulcer   . 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 Personal history of chemotherapy   . Personal history of radiation therapy   . Recurrent UTI last uti   oct 2017  . S/P radiation therapy 07/10/2014 through 08/23/2014   Left chest wall/regional lymph nodes 5040 cGy in 28 sessions, left chest wall boost 1000 cGy in 5 sessions   . Status post chemotherapy 11/25/2013 - 03/11/2014   6 cycles of neoadjuvant chemotherapy with TCH-P (Taxotere, Carbolatin, Herceptin and Perjeta followed by Neulasta  . Type 2 diabetes, diet controlled ([place])     SURGICAL HISTORY: Past Surgical History:  Procedure Laterality Date  . ABDOMINAL HYSTERECTOMY  1995   and 1996 Laparosopic Bilateral Salpingoophorectomy  . BREAST SURGERY Left 7/15   bx  . COLONOSCOPY  10-11 yrs ago    . COLONOSCOPY WITH PROPOFOL N/A 06/14/2016   Procedure: COLONOSCOPY WITH PROPOFOL;  Surgeon: Garlan Fair, MD;  Location: [facility];  Service: Endoscopy;  Laterality: N/A;  . MASTECTOMY Left   . MASTECTOMY W/ SENTINEL NODE BIOPSY Left 05/08/2014   Procedure: LEFT MASTECTOMY WITH SENTINEL LYMPH NODE BIOPSY;  Surgeon: [name], MD;  Location: [facility];  Service: General;  Laterality: Left;  . PORT-A-CATH REMOVAL Left 08/22/2014   Procedure: REMOVAL PORT-A-CATH;  Surgeon: [name], MD;  Location: [place] [place];  Service: General;  Laterality: Left;  . PORTACATH PLACEMENT N/A 11/20/2013   Procedure: INSERTION PORT-A-CATH;  Surgeon: [name], MD;  Location: [facility];  Service: General;  Laterality: N/A;  . TRANSTHORACIC ECHOCARDIOGRAM  05-30-2014   grade I diastolic dysfunction/ ef 08-65%/  moderate diffuse calcification AV without stenosis/  trivial MR and TR    I have reviewed the social history and family history with  the patient and they are unchanged from previous note.  ALLERGIES:  is allergic to eggs or egg-derived products; nickel; sulfa antibiotics; neosporin [neomycin-bacitracin zn-polymyx]; acyclovir and related; rosuvastatin; multihance [gadobenate]; penicillins; and tetanus toxoids.  MEDICATIONS:  Allergies as of 05/30/2017      Reactions   Eggs Or Egg-derived Products Nausea Only   "has never had a flu shot"   Nickel Hives   Sulfa Antibiotics Swelling   Neosporin [neomycin-bacitracin Zn-polymyx] Swelling   Swelling of area, redness   Contains sulfur   Acyclovir And Related    Rosuvastatin    Other reaction(s): myalgia   Multihance [gadobenate] Nausea And Vomiting   Contrast dye   Penicillins Rash   Tetanus Toxoids Swelling, Rash      Medication List        Accurate as of 05/30/17 11:59 PM. Always use your most recent med list.          acetaminophen 500 MG tablet Commonly known as:  TYLENOL Take 500 mg by mouth every 6 (six) hours as needed  (pain).   anastrozole 1 MG tablet Commonly known as:  ARIMIDEX Take 1 tablet (1 mg total) by mouth daily.   atorvastatin 20 MG tablet Commonly known as:  LIPITOR   calcium citrate-vitamin D 315-200 MG-UNIT tablet Commonly known as:  CITRACAL+D Take 1 tablet by mouth every other day.   Fish Oil 1000 MG Caps Take 1 capsule by mouth daily.   hydrochlorothiazide 25 MG tablet Commonly known as:  HYDRODIURIL Take 25 mg by mouth every morning.   multivitamin with minerals Tabs tablet Take 1 tablet by mouth daily.   REPLENS Gel Place 1 application vaginally. Uses every three to 5 days   SOLUBLE FIBER/PROBIOTICS PO Take 1 tablet by mouth daily. 1 billion active culture tablet  -  Per pt.   vitamin E 400 UNIT capsule Generic drug:  vitamin E Take 400 Units by mouth daily.        PHYSICAL EXAMINATION: ECOG PERFORMANCE STATUS: 0 BP (!) 141/77 (BP Location: Right Arm, Patient Position: Sitting) Comment: Myrtle RN is aware  Pulse 71   Temp 98 F (36.7 C) (Oral)   Resp 18   Ht '5\' 5"'  (1.651 m)   Wt 163 lb 8 oz (74.2 kg)   SpO2 100%   BMI 27.21 kg/m    GENERAL: The patient is an adult female who appears to be in no acute distress.  She appears to be comfortable. SKIN: skin color, texture, turgor are normal, no rashes or significant lesions except  (+) skin pigmentation at the left mastectomy site secondary to radiation. EYES: normal, Conjunctiva are pink and non-injected, sclera clear OROPHARYNX:no exudate, no erythema and lips, buccal mucosa, and tongue normal  NECK: supple, thyroid normal size, non-tender, without nodularity LYMPH:  no palpable lymphadenopathy in the cervical, axillary or inguinal LUNGS: clear to auscultation and percussion with normal breathing effort HEART: regular rate & rhythm and no murmurs and no lower extremity edema ABDOMEN:abdomen soft, non-tender and normal bowel sounds Musculoskeletal:no cyanosis of digits and no clubbing  NEURO: alert &  oriented x 3 with fluent speech, no focal motor/sensory deficits BREASTS: Status post left mastectomy, surgical incision has well healed, small scar at the previous port site.  No masses or nodules were noted in the left chest wall or axilla.  Right breast exam was without nodules, masses, tenderness, or skin change.  The right nipple was everted with no discharge.   EXTREMITIES: no edema  LABORATORY  DATA:  I have reviewed the data as listed. CBC Latest Ref Rng & Units 05/30/2017 12/08/2016 06/02/2016  WBC 3.9 - 10.3 K/uL 4.3 4.4 3.8(L)  Hemoglobin 11.6 - 15.9 g/dL 12.9 12.8 12.5  Hematocrit 34.8 - 46.6 % 39.0 38.2 37.6  Platelets 145 - 400 K/uL 158 145 168     CMP Latest Ref Rng & Units 05/30/2017 12/08/2016 06/02/2016  Glucose 70 - 140 mg/dL 92 89 92  BUN 7 - 26 mg/dL 15 16.4 15.6  Creatinine 0.60 - 1.10 mg/dL 0.89 1.0 0.9  Sodium 136 - 145 mmol/L 144 143 143  Potassium 3.5 - 5.1 mmol/L 4.1 4.3 4.1  Chloride 98 - 109 mmol/L 106 - -  CO2 22 - 29 mmol/L 30(H) 30(H) 32(H)  Calcium 8.4 - 10.4 mg/dL 9.9 9.8 10.0  Total Protein 6.4 - 8.3 g/dL 6.9 7.1 7.0  Total Bilirubin 0.2 - 1.2 mg/dL 0.8 0.84 0.84  Alkaline Phos 40 - 150 U/L 88 73 88  AST 5 - 34 U/L '22 21 22  ' ALT 0 - 55 U/L '20 17 20   ' PATHOLOGY REPORT: Diagnosis 05/08/2014 1. Breast, simple mastectomy, left  - PENDING STAINS. - MULTIFOCAL INVASIVE DUCTAL CARCINOMA WITH NEOADJUVANT RELATED CHANGES, SEE COMMENT. - NEGATIVE FOR LYMPH VASCULAR INVASION. - INVASIVE TUMOR IS 4 MM FROM NEAREST MARGIN (ANTERIOR). - DUCTAL CARCINOMA IN SITU - PREVIOUS BIOPSY SITE. - BENIGN SKIN; NEGATIVE FOR TUMOR - SEE TUMOR SYNOPTIC TEMPLATE BELOW. 2. Lymph node, sentinel, biopsy, left axillary #1 - ONE LYMPH NODE, POSITIVE FOR METASTATIC MAMMARY CARCINOMA (1/1). - INTRANODAL TUMOR DEPOSIT IS 2 MM - NEGATIVE FOR EXTRACAPSULAR TUMOR EXTENSION. 3. Lymph node, sentinel, biopsy, left axillary #2 - ONE LYMPH NODE, POSITIVE FOR METASTATIC MAMMARY CARCINOMA  (1/1). - INTRANODAL TUMOR DEPOSIT IS 4 MM - NEGATIVE FOR EXTRACAPSULAR TUMOR EXTENSION.   PROCEDURES  ECHO 11/12/2014 Study Conclusions - Left ventricle: The cavity size was normal. Wall thickness was increased in a pattern of mild LVH. There was focal basal hypertrophy. Systolic function was normal. The estimated ejection fraction was in the range of 60% to 65%. Wall motion was normal; there were no regional wall motion abnormalities. Doppler parameters are consistent with abnormal left ventricular relaxation (grade 1 diastolic dysfunction).  Screening Mammogram 11/18/16 IMPRESSION: No mammographic evidence of malignancy. A result letter of this screening mammogram will be mailed directly to the patient. RECOMMENDATION: Screening mammogram in one year  Mammogram 11/18/2015 IMPRESSION: No mammographic evidence of malignancy. A result letter of this screening mammogram will be mailed directly to the patient.   ASSESSMENT & PLAN:  This is a very pleasant 62 years-old African [place] female from [facility] who presents with a left Invasive Ductal Carcinoma. This was picked on a mammogram and maximal diameter was 1.7 cm on Ultrasound however the MRI breast showed this mass to be large almost 5.1 cm in maximal dimension making it a clinical T3 tumor. Also it showed a suspicious looking solitary lymph node in axilla but [place] was negative, not biopsied.  Clinical T3N0M0 Stage IIB.  The tumor biology indicates a LUMINAL-B type which means ER/PR/HER2 + tumor. Tumor is strongly ER+100%, PR 42%,KI-67 Index 53% showing a brisk mitotic rate and grade is called grade 2 (but grading as per pathologist cannot be relied upon on initial biopsy).   1. 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  -  Dr. Burr Medico previously discussed her  surgical pathology findings with her in details. Unfortunately she had metastasis to 2 Sentinel lymph nodes. Axillary lymph node dissection was discussed, Dr. Barry Dienes, Dr. Valere Dross and we recommend to proceed ALND, which is the current standard care. She however declined surgery due to the concern of lymphedema. She opted adjuvant radiation including the axillary area.  -She is now on adjuvant anastrozole, tolerating well, we'll continue. Recent clinical data has shown no significant difference on extended aromatase inhibitor for 7 vs 10 years, given her advanced stage and relative young age, Dr. Burr Medico recommended her to take it for total of 7 years. -Dr. Burr Medico again encouraged her to continue healthy diet and exercise regularly. She is very compliant on that.  -Dr. Burr Medico will see her every 6 months. We'll continue breast cancer surveillance with annual mammogram -She is clinically doing very well, I reviewed her lab results today, CBC and CPM are within normal limits and were stable when compared to her prior studies. Her physical exam was unremarkable today. 2018 mammogram was normal. No evidence for recurrence so far. -next mammogram in 11/2017 -f/u in 6 months  2. Bone health  -Her last bone density scan was done on 11/19/2014, which showed normal bone density.    3. HTN, DM, hypercholesterolemia -Follow up with primary care physician   Plan -Lab and f/u in 6 months   All questions were answered. The patient knows to call the clinic with any problems, questions or concerns. No barriers to learning was detected.

## 2017-05-30 ENCOUNTER — Inpatient Hospital Stay: Payer: Managed Care, Other (non HMO) | Attending: Hematology

## 2017-05-30 ENCOUNTER — Inpatient Hospital Stay (HOSPITAL_BASED_OUTPATIENT_CLINIC_OR_DEPARTMENT_OTHER): Payer: Managed Care, Other (non HMO) | Admitting: Medical

## 2017-05-30 ENCOUNTER — Telehealth: Payer: Self-pay | Admitting: Medical

## 2017-05-30 VITALS — BP 141/77 | HR 71 | Temp 98.0°F | Resp 18 | Ht 65.0 in | Wt 163.5 lb

## 2017-05-30 DIAGNOSIS — I10 Essential (primary) hypertension: Secondary | ICD-10-CM | POA: Diagnosis not present

## 2017-05-30 DIAGNOSIS — Z79811 Long term (current) use of aromatase inhibitors: Secondary | ICD-10-CM

## 2017-05-30 DIAGNOSIS — Z170 Estrogen receptor positive status [ER+]: Secondary | ICD-10-CM

## 2017-05-30 DIAGNOSIS — C50412 Malignant neoplasm of upper-outer quadrant of left female breast: Secondary | ICD-10-CM

## 2017-05-30 DIAGNOSIS — E119 Type 2 diabetes mellitus without complications: Secondary | ICD-10-CM | POA: Insufficient documentation

## 2017-05-30 DIAGNOSIS — E7800 Pure hypercholesterolemia, unspecified: Secondary | ICD-10-CM | POA: Diagnosis not present

## 2017-05-30 LAB — CBC WITH DIFFERENTIAL/PLATELET
Basophils Absolute: 0 10*3/uL (ref 0.0–0.1)
Basophils Relative: 1 %
EOS ABS: 0.1 10*3/uL (ref 0.0–0.5)
Eosinophils Relative: 2 %
HCT: 39 % (ref 34.8–46.6)
HEMOGLOBIN: 12.9 g/dL (ref 11.6–15.9)
LYMPHS ABS: 1.2 10*3/uL (ref 0.9–3.3)
Lymphocytes Relative: 28 %
MCH: 29.4 pg (ref 25.1–34.0)
MCHC: 33 g/dL (ref 31.5–36.0)
MCV: 89 fL (ref 79.5–101.0)
MONO ABS: 0.4 10*3/uL (ref 0.1–0.9)
MONOS PCT: 9 %
Neutro Abs: 2.6 10*3/uL (ref 1.5–6.5)
Neutrophils Relative %: 60 %
PLATELETS: 158 10*3/uL (ref 145–400)
RBC: 4.37 MIL/uL (ref 3.70–5.45)
RDW: 14.9 % — ABNORMAL HIGH (ref 11.2–14.5)
WBC: 4.3 10*3/uL (ref 3.9–10.3)

## 2017-05-30 LAB — COMPREHENSIVE METABOLIC PANEL
ALT: 20 U/L (ref 0–55)
AST: 22 U/L (ref 5–34)
Albumin: 3.8 g/dL (ref 3.5–5.0)
Alkaline Phosphatase: 88 U/L (ref 40–150)
Anion gap: 8 (ref 3–11)
BUN: 15 mg/dL (ref 7–26)
CHLORIDE: 106 mmol/L (ref 98–109)
CO2: 30 mmol/L — ABNORMAL HIGH (ref 22–29)
CREATININE: 0.89 mg/dL (ref 0.60–1.10)
Calcium: 9.9 mg/dL (ref 8.4–10.4)
GFR calc Af Amer: >60 mL/min (ref >60)
GFR calc non Af Amer: >60 mL/min (ref >60)
GLUCOSE: 92 mg/dL (ref 70–140)
Potassium: 4.1 mmol/L (ref 3.5–5.1)
SODIUM: 144 mmol/L (ref 136–145)
Total Bilirubin: 0.8 mg/dL (ref 0.2–1.2)
Total Protein: 6.9 g/dL (ref 6.4–8.3)

## 2017-05-30 NOTE — Telephone Encounter (Signed)
Appointments scheduled. AVS/Calendar printed. Patient was seen by Eliseo Squires due to Emergency with Dr Burr Medico per 2/25 los

## 2017-06-08 ENCOUNTER — Other Ambulatory Visit: Payer: Managed Care, Other (non HMO)

## 2017-06-08 ENCOUNTER — Ambulatory Visit: Payer: Managed Care, Other (non HMO) | Admitting: Hematology

## 2017-06-23 ENCOUNTER — Other Ambulatory Visit: Payer: Self-pay | Admitting: Hematology

## 2017-06-23 DIAGNOSIS — Z1231 Encounter for screening mammogram for malignant neoplasm of breast: Secondary | ICD-10-CM

## 2017-09-06 ENCOUNTER — Other Ambulatory Visit: Payer: Self-pay | Admitting: Nurse Practitioner

## 2017-09-06 ENCOUNTER — Telehealth: Payer: Self-pay

## 2017-09-06 DIAGNOSIS — C50412 Malignant neoplasm of upper-outer quadrant of left female breast: Secondary | ICD-10-CM

## 2017-09-06 MED ORDER — ANASTROZOLE 1 MG PO TABS: 1.0000 mg | ORAL_TABLET | Freq: Every day | ORAL | 3 refills | Status: DC

## 2017-09-06 NOTE — Telephone Encounter (Signed)
Patient calls for refill on Anastrozole 1 mg. She would like this sent to [facility] home delivery.   Her [phone]

## 2017-09-06 NOTE — Telephone Encounter (Signed)
I refilled.  Thanks, Regan Rakers NP

## 2017-11-15 NOTE — Progress Notes (Signed)
[facility] OFFICE PROGRESS NOTE  Patient Care Team: Leeroy Cha, MD as PCP - General (Internal Medicine) [name], MD as Consulting Physician (General Surgery) Truitt Merle, MD as Consulting Physician (Hematology) Arloa Koh, MD as Consulting Physician (Radiation Oncology)  SUMMARY OF ONCOLOGIC HISTORY: Oncology History    Breast cancer of upper-outer quadrant of left female breast   Staging form: Breast, AJCC 7th Edition     Clinical: Stage IIB (T3, N0, cM0) - Unsigned       Staging comments: Staged at breast conference 8.5.15      Pathologic stage from 05/08/2014: Stage IIA (yT1c(m), N1a, cM0) - Signed by Seward Grater, MD on 05/15/2014       Staging comments: Staged on final mastectomy specimen by Dr. Donato Heinz        Breast cancer of upper-outer quadrant of left female breast ([place])   10/17/2013 Mammogram    Left breast: persistent distortion in the UOQ, corresponding to the screening mammographic finding.     10/26/2013 Breast US    Left breast: hypoechoic irregular mass with surrounding distortion in the left breast 2 o'clock location 4 cm from the nipple measuring 1.1 x 0.9 x 0.7 cm. This corresponds to the mammographic finding. No left axillary LAD    10/30/2013 Initial Biopsy    Left breast core needle bx: Invasive ductal carcinoma, grade 2, ER+ (100%), PR+ (42%), HER2neu positive (ratio 2.8), Ki67 53%, DCIS with calcifications    11/05/2013 Breast MRI    Irregular enhancing mass measuring 5.1 cm in the upper and upper-outer quadrants of the left breast. The MR enhancing mass is much larger than the mass seen mammographically or sonographically.     11/05/2013 Breast MRI    Suspicious level 1 left axillary lymph node concerning for metastatic involving. But [place] negative, no node biopsy.     11/07/2013 Clinical Stage    Stage IIB: T2 N0 cM0    11/26/2013 - 11/26/2014 Neo-Adjuvant Chemotherapy    docetaxel, carboplatin, pertuzumab, and transtuzumab x 6  cycles (11/26/13-03/11/14) followed by mainteance trastuzumab (04/08/14-11/26/14)  to complete 1 year of therapy    05/08/2014 Surgery    Left mastectomy/SLNB Barry Dienes):  Multifocal invasive ductal carcinoma (0.5 cm, 1.4 cm), DCIS, 2/2 SLN were positive.    05/08/2014 Pathologic Stage    Stage IIA: ympT1c pN1a     07/10/2014 - 08/23/2014 Radiation Therapy    Adjuvant RT Valere Dross): Left chest wall/regional lymph nodes 50.4 Gy over 28 fractions; left chest wall boost 10 Gy over 5 fractions    08/22/2014 Procedure    port removal. Tip culture (+) Pseudomonas.     09/2014 -  Anti-estrogen oral therapy    Anastrozole 1 mg daily.  Planned duration of therapy 10 years Burr Medico).    01/16/2015 Survivorship    Survivorship visit completed and copy of care plan provided to patient.      CURRENT THERAPY: Anastrozole 1 mg once daily, started in June 2016.  INTERVAL HISTORY:  Rachel [name] returns for follow up. She is here alone. She moved to [facility] and came just to follow-up.  She will be moving to [place], [place] to stay with her daughter. She is doing well and has no new complaints. She tries to stay active, exercise, and maintain a healthy diet.    REVIEW OF SYSTEMS:  Constitutional: Denies fevers, chills Eyes: Denies blurriness of vision Ears, nose, mouth, throat, and face: Denies mucositis or sore throat Respiratory: Denies cough, dyspnea or wheezes Cardiovascular: Denies  palpitation, chest discomfort or lower extremity swelling Gastrointestinal:  Denies nausea, heartburn or change in bowel habits Skin: Denies abnormal skin rashes except a blister at the old port site Lymphatics: Denies new lymphadenopathy or easy bruising Neurological:Denies numbness, tingling or new weaknesses Behavioral/Psych: Mood is stable, no new changes  All other systems were reviewed with the patient and are negative except those in history.  MEDICAL HISTORY:  Past Medical History:  Diagnosis Date  . Alopecia areata     . Anemia due to chemotherapy   . Breast cancer [facility]) oncologist-  dr Krista Blue Jennifer Payes/  dx 07/ 2015--  DCIS left breast cT3, N0, Stage IIB  triple positive ,  ER+PR+HER2+/      s/p  chemotherapy 11-26-2013 to 03-11-2014 (after chemo, yT1cN1a)--  s/p  left mastectomy w/ SLN bx 05-08-2014/  radiation therapy currently started 07-10-2014  . History of endometriosis   . Hyperlipidemia   . Hypertension   . Infection due to Port-A-Cath    left chest area w/ cellulitis and skin ulcer   . 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 Personal history of chemotherapy   . Personal history of radiation therapy   . Recurrent UTI last uti   oct 2017  . S/P radiation therapy 07/10/2014 through 08/23/2014   Left chest wall/regional lymph nodes 5040 cGy in 28 sessions, left chest wall boost 1000 cGy in 5 sessions   . Status post chemotherapy 11/25/2013 - 03/11/2014   6 cycles of neoadjuvant chemotherapy with TCH-P (Taxotere, Carbolatin, Herceptin and Perjeta followed by Neulasta  . Type 2 diabetes, diet controlled ([place])     SURGICAL HISTORY: Past Surgical History:  Procedure Laterality Date  . ABDOMINAL HYSTERECTOMY  1995   and 1996 Laparosopic Bilateral Salpingoophorectomy  . BREAST SURGERY Left 7/15   bx  . COLONOSCOPY  10-11 yrs ago  . COLONOSCOPY WITH PROPOFOL N/A 06/14/2016   Procedure: COLONOSCOPY WITH PROPOFOL;  Surgeon: Garlan Fair, MD;  Location: [facility];  Service: Endoscopy;  Laterality: N/A;  . MASTECTOMY Left   . MASTECTOMY W/ SENTINEL NODE BIOPSY Left 05/08/2014   Procedure: LEFT MASTECTOMY WITH SENTINEL LYMPH NODE BIOPSY;  Surgeon: [name], MD;  Location: [facility];  Service: General;  Laterality: Left;  . PORT-A-CATH REMOVAL Left 08/22/2014   Procedure: REMOVAL PORT-A-CATH;  Surgeon: [name], MD;  Location: [place];  Service: General;  Laterality: Left;  . PORTACATH  PLACEMENT N/A 11/20/2013   Procedure: INSERTION PORT-A-CATH;  Surgeon: [name], MD;  Location: [facility];  Service: General;  Laterality: N/A;  . TRANSTHORACIC ECHOCARDIOGRAM  05-30-2014   grade I diastolic dysfunction/ ef 47-82%/  moderate diffuse calcification AV without stenosis/  trivial MR and TR    I have reviewed the social history and family history with the patient and they are unchanged from previous note.  ALLERGIES:  is allergic to eggs or egg-derived products; nickel; sulfa antibiotics; neosporin [neomycin-bacitracin zn-polymyx]; acyclovir and related; rosuvastatin; multihance [gadobenate]; penicillins; and tetanus toxoids.  MEDICATIONS:  Allergies as of 11/21/2017      Reactions   Eggs Or Egg-derived Products Nausea Only   "has never had a flu shot"   Nickel Hives   Sulfa Antibiotics Swelling   Neosporin [neomycin-bacitracin Zn-polymyx] Swelling   Swelling of area, redness   Contains sulfur   Acyclovir And Related    Rosuvastatin    Other reaction(s): myalgia   Multihance [gadobenate] Nausea And Vomiting   Contrast dye  Penicillins Rash   Tetanus Toxoids Swelling, Rash      Medication List        Accurate as of 11/21/17 11:10 PM. Always use your most recent med list.          acetaminophen 500 MG tablet Commonly known as:  TYLENOL Take 500 mg by mouth every 6 (six) hours as needed (pain).   anastrozole 1 MG tablet Commonly known as:  ARIMIDEX Take 1 tablet (1 mg total) by mouth daily.   atorvastatin 20 MG tablet Commonly known as:  LIPITOR Take 20 mg by mouth daily.   calcium citrate-vitamin D 315-200 MG-UNIT tablet Commonly known as:  CITRACAL+D Take 1 tablet by mouth every other day.   Co Q-10 100 MG Caps Take 1 capsule by mouth as needed.   Fish Oil 1000 MG Caps Take 1 capsule by mouth daily.   GLUCOSAMINE CHONDR 1500 COMPLX PO Take 1 tablet by mouth once a week.   hydrochlorothiazide 25 MG tablet Commonly known as:  HYDRODIURIL Take 25  mg by mouth every morning.   multivitamin with minerals Tabs tablet Take 1 tablet by mouth daily.   REPLENS Gel Place 1 application vaginally. Uses every three to 5 days   SOLUBLE FIBER/PROBIOTICS PO Take 1 tablet by mouth daily. 1 billion active culture tablet  -  Per pt.   vitamin E 400 UNIT capsule Generic drug:  vitamin E Take 400 Units by mouth daily.        PHYSICAL EXAMINATION: ECOG PERFORMANCE STATUS: 0 BP 139/82 (BP Location: Right Arm, Patient Position: Sitting)   Pulse 71   Temp 98.4 F (36.9 C) (Oral)   Resp 17   Ht '5\' 5"'  (1.651 m)   Wt 166 lb 12.8 oz (75.7 kg)   SpO2 100%   BMI 27.76 kg/m    GENERAL:alert, no distress and comfortable. SKIN: skin color, texture, turgor are normal, no rashes or significant lesions except  (+) skin pigmentation at the left mastectomy site secondary to radiation. EYES: normal, Conjunctiva are pink and non-injected, sclera clear OROPHARYNX:no exudate, no erythema and lips, buccal mucosa, and tongue normal  NECK: supple, thyroid normal size, non-tender, without nodularity LYMPH:  no palpable lymphadenopathy in the cervical, axillary or inguinal LUNGS: clear to auscultation and percussion with normal breathing effort HEART: regular rate & rhythm and no murmurs and no lower extremity edema ABDOMEN:abdomen soft, non-tender and normal bowel sounds Musculoskeletal:no cyanosis of digits and no clubbing  NEURO: alert & oriented x 3 with fluent speech, no focal motor/sensory deficits BREASTS: Status post left mastectomy, surgical incision has well healed, small scar at the previous port site with numbness at site of surgery. No palpable mass on the left chest wall or axilla. Palpation of the right breasts and axilla revealed no obvious mass that I could appreciate. EXTREMITIES: no edema  LABORATORY DATA:  I have reviewed the data as listed. CBC Latest Ref Rng & Units 11/21/2017 05/30/2017 12/08/2016  WBC 3.9 - 10.3 K/uL 4.5 4.3 4.4    Hemoglobin 11.6 - 15.9 g/dL 12.9 12.9 12.8  Hematocrit 34.8 - 46.6 % 39.1 39.0 38.2  Platelets 145 - 400 K/uL 159 158 145     CMP Latest Ref Rng & Units 11/21/2017 05/30/2017 12/08/2016  Glucose 70 - 99 mg/dL 97 92 89  BUN 8 - 23 mg/dL 20 15 16.4  Creatinine 0.44 - 1.00 mg/dL 0.91 0.89 1.0  Sodium 135 - 145 mmol/L 144 144 143  Potassium 3.5 - 5.1  mmol/L 3.7 4.1 4.3  Chloride 98 - 111 mmol/L 105 106 -  CO2 22 - 32 mmol/L 31 30(H) 30(H)  Calcium 8.9 - 10.3 mg/dL 9.4 9.9 9.8  Total Protein 6.5 - 8.1 g/dL 7.0 6.9 7.1  Total Bilirubin 0.3 - 1.2 mg/dL 0.7 0.8 0.84  Alkaline Phos 38 - 126 U/L 71 88 73  AST 15 - 41 U/L '21 22 21  ' ALT 0 - 44 U/L '21 20 17   ' PATHOLOGY REPORT: Diagnosis 05/08/2014 1. Breast, simple mastectomy, left  - PENDING STAINS. - MULTIFOCAL INVASIVE DUCTAL CARCINOMA WITH NEOADJUVANT RELATED CHANGES, SEE COMMENT. - NEGATIVE FOR LYMPH VASCULAR INVASION. - INVASIVE TUMOR IS 4 MM FROM NEAREST MARGIN (ANTERIOR). - DUCTAL CARCINOMA IN SITU - PREVIOUS BIOPSY SITE. - BENIGN SKIN; NEGATIVE FOR TUMOR - SEE TUMOR SYNOPTIC TEMPLATE BELOW. 2. Lymph node, sentinel, biopsy, left axillary #1 - ONE LYMPH NODE, POSITIVE FOR METASTATIC MAMMARY CARCINOMA (1/1). - INTRANODAL TUMOR DEPOSIT IS 2 MM - NEGATIVE FOR EXTRACAPSULAR TUMOR EXTENSION. 3. Lymph node, sentinel, biopsy, left axillary #2 - ONE LYMPH NODE, POSITIVE FOR METASTATIC MAMMARY CARCINOMA (1/1). - INTRANODAL TUMOR DEPOSIT IS 4 MM - NEGATIVE FOR EXTRACAPSULAR TUMOR EXTENSION.   PROCEDURES  ECHO 11/12/2014 Study Conclusions - Left ventricle: The cavity size was normal. Wall thickness was increased in a pattern of mild LVH. There was focal basal hypertrophy. Systolic function was normal. The estimated ejection fraction was in the range of 60% to 65%. Wall motion was normal; there were no regional wall motion abnormalities. Doppler parameters are consistent with abnormal left ventricular relaxation (grade 1  diastolic dysfunction).   RADIOGRAPHIC STUDIES: I have personally reviewed the radiological images as listed and agreed with the findings in the report.  Screening Mammogram 11/18/16 IMPRESSION: No mammographic evidence of malignancy. A result letter of this screening mammogram will be mailed directly to the patient. RECOMMENDATION: Screening mammogram in one year  Mammogram 11/18/2015 IMPRESSION: No mammographic evidence of malignancy. A result letter of this screening mammogram will be mailed directly to the patient.   ASSESSMENT & PLAN:  This is a very pleasant 62 years-old African [place] female from [facility] who presents with a left Invasive Ductal Carcinoma. This was picked on a mammogram and maximal diameter was 1.7 cm on Ultrasound however the MRI breast showed this mass to be large almost 5.1 cm in maximal dimension making it a clinical T3 tumor. Also it showed a suspicious looking solitary lymph node in axilla but [place] was negative, not biopsied.  Clinical T3N0M0 Stage IIB.  The tumor biology indicates a LUMINAL-B type which means ER/PR/HER2 + tumor. Tumor is strongly ER+100%, PR 42%,KI-67 Index 53% showing a brisk mitotic rate and grade is called grade 2 (but grading as per pathologist cannot be relied upon on initial biopsy).   1. 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  -I previously discussed her surgical pathology findings with her in details. Unfortunately she had metastasis to 2 Sentinel lymph nodes. Axillary lymph node dissection was discussed, Dr. Barry Dienes, Dr. Valere Dross and we recommend to proceed ALND, which is the current standard care. She however declined surgery due to the concern of lymphedema. She opted adjuvant radiation including the axillary area.  -She is now on adjuvant anastrozole, tolerating well,  we'll continue. Recent clinical data has shown no significant difference on extended aromatase inhibitor for 7 vs  10 years, given her advanced stage and relative young age, I recommend her to take it for total of 7 years. -She is clinically doing very well, I reviewed her lab results today, CBC and CPM are within normal limits. Her physical exam was unremarkable today. She had mammogram today which was normal. No evidence for recurrence so far. - We'll continue breast cancer surveillance with annual mammogram and routine f/u  -I again encouraged her to continue healthy diet and exercise regularly. She is very compliant on that.  -She will be moving to [place] at the end of this year. She will call me when she find her new oncologist in [place] and we will refer her. She signed the consent for release today -I will see her as needed in future     2. Bone health  - I again encouraged her to continue taking calcium and vitamin D. -We previously discussed the potential side effects of osteoporosis from anastrozole -Bone density scan on 11/19/2014 showed normal bone density.  -Her last bone density was normal and was done in 2018 at [place].   3. HTN, DM, hypercholesterolemia -Follow up with primary care physician -We previously discussed the diet and exercise for hypercholesterolemia. She cannot tolerate statin, I encouraged her to discuss with her primary care physician about other cholesterol medicine. -I encouraged the patient to continue to stay active.   Plan -Continue anastrozole -she will see her new oncologist in [place] after she relocates there  -Her f/u will be open    All questions were answered. The patient knows to call the clinic with any problems, questions or concerns. No barriers to learning was detected.  I spent 20 minutes counseling the patient face to face. The total time spent in the appointment was 25 minutes and more than 50% was on counseling and review of test results  I,  Noor Dweik am acting as scribe for Dr. Truitt Merle.  I have reviewed the above documentation for accuracy and completeness, and I agree with the above.    Truitt Merle, MD 11/21/2017

## 2017-11-21 ENCOUNTER — Inpatient Hospital Stay: Payer: Managed Care, Other (non HMO) | Attending: Hematology

## 2017-11-21 ENCOUNTER — Ambulatory Visit
Admission: RE | Admit: 2017-11-21 | Discharge: 2017-11-21 | Disposition: A | Discharge status: Home / Self Care | Payer: Managed Care, Other (non HMO) | Source: Ambulatory Visit | Attending: Hematology | Admitting: Hematology

## 2017-11-21 ENCOUNTER — Inpatient Hospital Stay: Payer: Managed Care, Other (non HMO) | Admitting: Hematology

## 2017-11-21 ENCOUNTER — Encounter: Payer: Self-pay | Admitting: Hematology

## 2017-11-21 ENCOUNTER — Telehealth: Payer: Self-pay | Admitting: Hematology

## 2017-11-21 VITALS — BP 139/82 | HR 71 | Temp 98.4°F | Resp 17 | Ht 65.0 in | Wt 166.8 lb

## 2017-11-21 DIAGNOSIS — E119 Type 2 diabetes mellitus without complications: Secondary | ICD-10-CM | POA: Insufficient documentation

## 2017-11-21 DIAGNOSIS — C50412 Malignant neoplasm of upper-outer quadrant of left female breast: Secondary | ICD-10-CM | POA: Insufficient documentation

## 2017-11-21 DIAGNOSIS — Z170 Estrogen receptor positive status [ER+]: Secondary | ICD-10-CM

## 2017-11-21 DIAGNOSIS — I10 Essential (primary) hypertension: Secondary | ICD-10-CM

## 2017-11-21 DIAGNOSIS — E7800 Pure hypercholesterolemia, unspecified: Secondary | ICD-10-CM | POA: Diagnosis not present

## 2017-11-21 DIAGNOSIS — Z79811 Long term (current) use of aromatase inhibitors: Secondary | ICD-10-CM

## 2017-11-21 DIAGNOSIS — Z1231 Encounter for screening mammogram for malignant neoplasm of breast: Secondary | ICD-10-CM

## 2017-11-21 LAB — COMPREHENSIVE METABOLIC PANEL
ALBUMIN: 3.9 g/dL (ref 3.5–5.0)
ALK PHOS: 71 U/L (ref 38–126)
ALT: 21 U/L (ref 0–44)
AST: 21 U/L (ref 15–41)
Anion gap: 8 (ref 5–15)
BILIRUBIN TOTAL: 0.7 mg/dL (ref 0.3–1.2)
BUN: 20 mg/dL (ref 8–23)
CO2: 31 mmol/L (ref 22–32)
Calcium: 9.4 mg/dL (ref 8.9–10.3)
Chloride: 105 mmol/L (ref 98–111)
Creatinine, Ser: 0.91 mg/dL (ref 0.44–1.00)
GFR calc Af Amer: >60 mL/min (ref >60)
GFR calc non Af Amer: >60 mL/min (ref >60)
GLUCOSE: 97 mg/dL (ref 70–99)
POTASSIUM: 3.7 mmol/L (ref 3.5–5.1)
Sodium: 144 mmol/L (ref 135–145)
TOTAL PROTEIN: 7 g/dL (ref 6.5–8.1)

## 2017-11-21 LAB — CBC WITH DIFFERENTIAL/PLATELET
BASOS ABS: 0.1 10*3/uL (ref 0.0–0.1)
Basophils Relative: 1 %
Eosinophils Absolute: 0.1 10*3/uL (ref 0.0–0.5)
Eosinophils Relative: 2 %
HCT: 39.1 % (ref 34.8–46.6)
Hemoglobin: 12.9 g/dL (ref 11.6–15.9)
LYMPHS PCT: 34 %
Lymphs Abs: 1.5 10*3/uL (ref 0.9–3.3)
MCH: 29.2 pg (ref 25.1–34.0)
MCHC: 33 g/dL (ref 31.5–36.0)
MCV: 88.5 fL (ref 79.5–101.0)
MONO ABS: 0.4 10*3/uL (ref 0.1–0.9)
MONOS PCT: 10 %
Neutro Abs: 2.4 10*3/uL (ref 1.5–6.5)
Neutrophils Relative %: 53 %
PLATELETS: 159 10*3/uL (ref 145–400)
RBC: 4.42 MIL/uL (ref 3.70–5.45)
RDW: 14.5 % (ref 11.2–14.5)
WBC: 4.5 10*3/uL (ref 3.9–10.3)

## 2017-11-21 NOTE — Telephone Encounter (Signed)
No LOS 8/19

## 2017-11-29 ENCOUNTER — Telehealth: Payer: Self-pay | Admitting: Hematology

## 2017-11-29 ENCOUNTER — Other Ambulatory Visit: Payer: Self-pay

## 2017-11-29 ENCOUNTER — Telehealth: Payer: Self-pay

## 2017-11-29 NOTE — Telephone Encounter (Signed)
Patient calls requesting f/u appointment in February 2020 until she finds another provider, sent scheduling message, Patient also wants to add B12 1,000 units a day, and Biotin 10,000 units daily to medication list.

## 2017-11-29 NOTE — Telephone Encounter (Signed)
Scheduled appt per 8/27 sch message - sent reminder letter in the mail with appt date and time.

## 2017-12-06 ENCOUNTER — Telehealth: Payer: Self-pay | Admitting: Hematology

## 2017-12-06 NOTE — Telephone Encounter (Signed)
Returned pts call to r/s appts   °

## 2018-02-09 ENCOUNTER — Telehealth: Payer: Self-pay

## 2018-02-09 NOTE — Telephone Encounter (Signed)
Patient calls with information only has an appointment with a oncologist in [place]. Dr. Jerolyn Center Office [phone] fax [phone]

## 2018-02-10 NOTE — Telephone Encounter (Signed)
Rachel [name],  Could you send her medical records to her new oncologist in [place]? Please let pt and [place] know when it's done. Thanks much.   Truitt Merle MD

## 2018-02-14 ENCOUNTER — Telehealth: Payer: Self-pay | Admitting: Hematology

## 2018-02-14 NOTE — Telephone Encounter (Signed)
Faxed records to Dr Stefanie Libel [phone]

## 2018-02-17 ENCOUNTER — Other Ambulatory Visit: Payer: Self-pay | Admitting: *Deleted

## 2018-02-17 ENCOUNTER — Telehealth: Payer: Self-pay | Admitting: *Deleted

## 2018-02-17 DIAGNOSIS — C50412 Malignant neoplasm of upper-outer quadrant of left female breast: Secondary | ICD-10-CM

## 2018-02-17 MED ORDER — ANASTROZOLE 1 MG PO TABS: 1.0000 mg | ORAL_TABLET | Freq: Every day | ORAL | 3 refills | Status: AC

## 2018-02-17 NOTE — Telephone Encounter (Signed)
Received TC from patient for new prescription for Anastrozole to go to a new pharmacy as [place] has moved out of state. This was done while on the phone with patient.

## 2018-03-27 ENCOUNTER — Telehealth: Payer: Self-pay | Admitting: *Deleted

## 2018-03-27 NOTE — Telephone Encounter (Signed)
Medical records faxed to Juliette Mangle [place] office; RID [id number]

## 2018-05-09 ENCOUNTER — Telehealth: Payer: Self-pay

## 2018-05-09 ENCOUNTER — Telehealth: Payer: Self-pay | Admitting: Hematology

## 2018-05-09 NOTE — Telephone Encounter (Signed)
Patient calls to let us know she cancelled her next appointment on 2/28, has seen an oncologist in [place] yesterday and will be following up with them.  She thanked Dr. Burr Medico for everything.

## 2018-05-09 NOTE — Telephone Encounter (Signed)
Tried to reach regarding voicemail °

## 2018-06-01 ENCOUNTER — Ambulatory Visit: Payer: Managed Care, Other (non HMO) | Admitting: Hematology

## 2018-06-01 ENCOUNTER — Other Ambulatory Visit: Payer: Managed Care, Other (non HMO)

## 2018-06-02 ENCOUNTER — Other Ambulatory Visit: Payer: Managed Care, Other (non HMO)

## 2018-06-02 ENCOUNTER — Ambulatory Visit: Payer: Managed Care, Other (non HMO) | Admitting: Hematology

## 2018-12-28 ENCOUNTER — Other Ambulatory Visit: Payer: Self-pay | Admitting: Hematology
